# Patient Record
Sex: Female | Born: 1950 | Race: Black or African American | Hispanic: No | Marital: Single | Smoking: Former smoker
Health system: Southern US, Community
[De-identification: ages and names within clinical notes are randomized; demographics above are authoritative.]

## PROBLEM LIST (undated history)

## (undated) DIAGNOSIS — D649 Anemia, unspecified: Secondary | ICD-10-CM

## (undated) DIAGNOSIS — J449 Chronic obstructive pulmonary disease, unspecified: Secondary | ICD-10-CM

## (undated) DIAGNOSIS — E119 Type 2 diabetes mellitus without complications: Secondary | ICD-10-CM

## (undated) DIAGNOSIS — E876 Hypokalemia: Secondary | ICD-10-CM

## (undated) DIAGNOSIS — F319 Bipolar disorder, unspecified: Secondary | ICD-10-CM

## (undated) DIAGNOSIS — J309 Allergic rhinitis, unspecified: Secondary | ICD-10-CM

## (undated) DIAGNOSIS — E785 Hyperlipidemia, unspecified: Secondary | ICD-10-CM

## (undated) DIAGNOSIS — I2510 Atherosclerotic heart disease of native coronary artery without angina pectoris: Secondary | ICD-10-CM

## (undated) DIAGNOSIS — Z8673 Personal history of transient ischemic attack (TIA), and cerebral infarction without residual deficits: Secondary | ICD-10-CM

## (undated) DIAGNOSIS — C50919 Malignant neoplasm of unspecified site of unspecified female breast: Secondary | ICD-10-CM

## (undated) DIAGNOSIS — C801 Malignant (primary) neoplasm, unspecified: Secondary | ICD-10-CM

## (undated) DIAGNOSIS — R002 Palpitations: Secondary | ICD-10-CM

## (undated) DIAGNOSIS — K219 Gastro-esophageal reflux disease without esophagitis: Secondary | ICD-10-CM

## (undated) HISTORY — DX: Allergic rhinitis, unspecified: J30.9

## (undated) HISTORY — DX: Hyperlipidemia, unspecified: E78.5

## (undated) HISTORY — PX: DILATION AND CURETTAGE OF UTERUS: SHX78

## (undated) HISTORY — PX: OTHER SURGICAL HISTORY: SHX169

## (undated) HISTORY — PX: COLONOSCOPY: SHX174

## (undated) HISTORY — DX: Bipolar disorder, unspecified: F31.9

## (undated) HISTORY — DX: Malignant neoplasm of unspecified site of unspecified female breast: C50.919

## (undated) HISTORY — DX: Personal history of transient ischemic attack (TIA), and cerebral infarction without residual deficits: Z86.73

## (undated) HISTORY — PX: STERILIZATION: SHX533

## (undated) HISTORY — DX: Gastro-esophageal reflux disease without esophagitis: K21.9

## (undated) HISTORY — PX: UPPER GASTROINTESTINAL ENDOSCOPY: SHX188

## (undated) HISTORY — DX: Chronic obstructive pulmonary disease, unspecified: J44.9

---

## 2000-01-25 ENCOUNTER — Other Ambulatory Visit: Admission: RE | Admit: 2000-01-25 | Discharge: 2000-01-25 | Payer: Self-pay | Admitting: Oral Surgery

## 2000-12-04 ENCOUNTER — Other Ambulatory Visit: Admission: RE | Admit: 2000-12-04 | Discharge: 2000-12-04 | Payer: Self-pay | Admitting: Specialist

## 2000-12-17 ENCOUNTER — Ambulatory Visit (HOSPITAL_COMMUNITY): Admission: RE | Admit: 2000-12-17 | Discharge: 2000-12-17 | Payer: Self-pay | Admitting: Specialist

## 2000-12-17 ENCOUNTER — Encounter: Payer: Self-pay | Admitting: Specialist

## 2001-01-08 ENCOUNTER — Ambulatory Visit (HOSPITAL_COMMUNITY): Admission: RE | Admit: 2001-01-08 | Discharge: 2001-01-08 | Payer: Self-pay | Admitting: Internal Medicine

## 2001-01-08 ENCOUNTER — Encounter (INDEPENDENT_AMBULATORY_CARE_PROVIDER_SITE_OTHER): Payer: Self-pay | Admitting: Internal Medicine

## 2001-08-16 ENCOUNTER — Emergency Department (HOSPITAL_COMMUNITY): Admission: EM | Admit: 2001-08-16 | Discharge: 2001-08-16 | Payer: Self-pay | Admitting: Emergency Medicine

## 2001-08-16 ENCOUNTER — Encounter: Payer: Self-pay | Admitting: Emergency Medicine

## 2001-08-26 ENCOUNTER — Emergency Department (HOSPITAL_COMMUNITY): Admission: EM | Admit: 2001-08-26 | Discharge: 2001-08-26 | Payer: Self-pay | Admitting: *Deleted

## 2001-09-02 ENCOUNTER — Emergency Department (HOSPITAL_COMMUNITY): Admission: EM | Admit: 2001-09-02 | Discharge: 2001-09-02 | Payer: Self-pay | Admitting: Emergency Medicine

## 2001-09-02 ENCOUNTER — Encounter: Payer: Self-pay | Admitting: Orthopaedic Surgery

## 2001-09-02 ENCOUNTER — Encounter: Payer: Self-pay | Admitting: Emergency Medicine

## 2001-09-02 ENCOUNTER — Ambulatory Visit (HOSPITAL_COMMUNITY): Admission: RE | Admit: 2001-09-02 | Discharge: 2001-09-02 | Payer: Self-pay | Admitting: Orthopaedic Surgery

## 2001-12-19 ENCOUNTER — Ambulatory Visit (HOSPITAL_COMMUNITY): Admission: RE | Admit: 2001-12-19 | Discharge: 2001-12-19 | Payer: Self-pay | Admitting: Specialist

## 2001-12-19 ENCOUNTER — Encounter: Payer: Self-pay | Admitting: Specialist

## 2002-04-14 ENCOUNTER — Encounter (INDEPENDENT_AMBULATORY_CARE_PROVIDER_SITE_OTHER): Payer: Self-pay | Admitting: Internal Medicine

## 2002-04-14 ENCOUNTER — Ambulatory Visit (HOSPITAL_COMMUNITY): Admission: RE | Admit: 2002-04-14 | Discharge: 2002-04-14 | Payer: Self-pay | Admitting: Internal Medicine

## 2002-05-06 ENCOUNTER — Encounter: Payer: Self-pay | Admitting: Urology

## 2002-05-06 ENCOUNTER — Ambulatory Visit (HOSPITAL_COMMUNITY): Admission: RE | Admit: 2002-05-06 | Discharge: 2002-05-06 | Payer: Self-pay | Admitting: Urology

## 2002-05-19 ENCOUNTER — Encounter (INDEPENDENT_AMBULATORY_CARE_PROVIDER_SITE_OTHER): Payer: Self-pay | Admitting: Internal Medicine

## 2002-05-19 ENCOUNTER — Ambulatory Visit (HOSPITAL_COMMUNITY): Admission: RE | Admit: 2002-05-19 | Discharge: 2002-05-19 | Payer: Self-pay | Admitting: Internal Medicine

## 2002-05-26 ENCOUNTER — Encounter: Payer: Self-pay | Admitting: Urology

## 2002-05-26 ENCOUNTER — Ambulatory Visit (HOSPITAL_COMMUNITY): Admission: RE | Admit: 2002-05-26 | Discharge: 2002-05-26 | Payer: Self-pay | Admitting: Urology

## 2002-08-05 ENCOUNTER — Emergency Department (HOSPITAL_COMMUNITY): Admission: EM | Admit: 2002-08-05 | Discharge: 2002-08-05 | Payer: Self-pay | Admitting: *Deleted

## 2002-11-13 ENCOUNTER — Ambulatory Visit (HOSPITAL_COMMUNITY): Admission: RE | Admit: 2002-11-13 | Discharge: 2002-11-13 | Payer: Self-pay | Admitting: Pulmonary Disease

## 2002-12-08 ENCOUNTER — Encounter: Payer: Self-pay | Admitting: Emergency Medicine

## 2002-12-08 ENCOUNTER — Emergency Department (HOSPITAL_COMMUNITY): Admission: EM | Admit: 2002-12-08 | Discharge: 2002-12-08 | Payer: Self-pay | Admitting: Emergency Medicine

## 2002-12-28 ENCOUNTER — Encounter: Payer: Self-pay | Admitting: Specialist

## 2002-12-28 ENCOUNTER — Ambulatory Visit (HOSPITAL_COMMUNITY): Admission: RE | Admit: 2002-12-28 | Discharge: 2002-12-28 | Payer: Self-pay | Admitting: Specialist

## 2003-01-28 ENCOUNTER — Emergency Department (HOSPITAL_COMMUNITY): Admission: EM | Admit: 2003-01-28 | Discharge: 2003-01-29 | Payer: Self-pay | Admitting: *Deleted

## 2003-12-28 ENCOUNTER — Ambulatory Visit (HOSPITAL_COMMUNITY): Admission: RE | Admit: 2003-12-28 | Discharge: 2003-12-28 | Payer: Self-pay | Admitting: Pulmonary Disease

## 2003-12-30 ENCOUNTER — Ambulatory Visit (HOSPITAL_COMMUNITY): Admission: RE | Admit: 2003-12-30 | Discharge: 2003-12-30 | Payer: Self-pay | Admitting: Obstetrics & Gynecology

## 2004-01-07 ENCOUNTER — Ambulatory Visit (HOSPITAL_COMMUNITY): Admission: RE | Admit: 2004-01-07 | Discharge: 2004-01-07 | Payer: Self-pay

## 2004-01-31 ENCOUNTER — Ambulatory Visit (HOSPITAL_COMMUNITY): Admission: RE | Admit: 2004-01-31 | Discharge: 2004-01-31 | Payer: Self-pay | Admitting: Internal Medicine

## 2004-02-14 ENCOUNTER — Ambulatory Visit (HOSPITAL_COMMUNITY): Admission: RE | Admit: 2004-02-14 | Discharge: 2004-02-14 | Payer: Self-pay | Admitting: Pulmonary Disease

## 2004-02-15 ENCOUNTER — Ambulatory Visit (HOSPITAL_COMMUNITY): Admission: RE | Admit: 2004-02-15 | Discharge: 2004-02-15 | Payer: Self-pay

## 2004-05-11 ENCOUNTER — Ambulatory Visit: Payer: Self-pay | Admitting: Infectious Diseases

## 2004-05-17 ENCOUNTER — Emergency Department (HOSPITAL_COMMUNITY): Admission: EM | Admit: 2004-05-17 | Discharge: 2004-05-17 | Payer: Self-pay | Admitting: Emergency Medicine

## 2004-06-05 ENCOUNTER — Ambulatory Visit: Payer: Self-pay | Admitting: Infectious Diseases

## 2004-06-07 ENCOUNTER — Ambulatory Visit: Payer: Self-pay | Admitting: Internal Medicine

## 2004-06-23 ENCOUNTER — Ambulatory Visit: Payer: Self-pay | Admitting: Internal Medicine

## 2004-07-04 ENCOUNTER — Ambulatory Visit: Payer: Self-pay | Admitting: Internal Medicine

## 2004-09-07 ENCOUNTER — Ambulatory Visit: Payer: Self-pay | Admitting: Internal Medicine

## 2005-01-01 ENCOUNTER — Ambulatory Visit (HOSPITAL_COMMUNITY): Admission: RE | Admit: 2005-01-01 | Discharge: 2005-01-01 | Payer: Self-pay | Admitting: Pulmonary Disease

## 2005-01-02 ENCOUNTER — Ambulatory Visit: Payer: Self-pay | Admitting: Internal Medicine

## 2005-01-15 ENCOUNTER — Encounter: Payer: Self-pay | Admitting: Emergency Medicine

## 2005-01-16 ENCOUNTER — Inpatient Hospital Stay (HOSPITAL_COMMUNITY): Admission: RE | Admit: 2005-01-16 | Discharge: 2005-01-22 | Payer: Self-pay | Admitting: Psychiatry

## 2005-01-16 ENCOUNTER — Ambulatory Visit: Payer: Self-pay | Admitting: Psychiatry

## 2005-02-16 ENCOUNTER — Emergency Department (HOSPITAL_COMMUNITY): Admission: EM | Admit: 2005-02-16 | Discharge: 2005-02-17 | Payer: Self-pay | Admitting: Emergency Medicine

## 2005-05-30 ENCOUNTER — Ambulatory Visit: Payer: Self-pay | Admitting: Internal Medicine

## 2005-10-08 ENCOUNTER — Ambulatory Visit: Payer: Self-pay | Admitting: Internal Medicine

## 2005-11-01 ENCOUNTER — Ambulatory Visit: Payer: Self-pay | Admitting: Internal Medicine

## 2005-12-04 ENCOUNTER — Ambulatory Visit (HOSPITAL_COMMUNITY): Admission: RE | Admit: 2005-12-04 | Discharge: 2005-12-04 | Payer: Self-pay | Admitting: Pulmonary Disease

## 2006-04-10 ENCOUNTER — Ambulatory Visit: Payer: Self-pay | Admitting: Internal Medicine

## 2006-04-10 ENCOUNTER — Inpatient Hospital Stay (HOSPITAL_COMMUNITY): Admission: AD | Admit: 2006-04-10 | Discharge: 2006-04-15 | Payer: Self-pay | Admitting: Pulmonary Disease

## 2009-08-10 ENCOUNTER — Inpatient Hospital Stay (HOSPITAL_COMMUNITY)
Admission: EM | Admit: 2009-08-10 | Discharge: 2009-08-14 | Payer: Self-pay | Source: Home / Self Care | Admitting: Emergency Medicine

## 2009-08-10 ENCOUNTER — Ambulatory Visit: Payer: Self-pay | Admitting: Cardiology

## 2009-08-11 ENCOUNTER — Encounter (INDEPENDENT_AMBULATORY_CARE_PROVIDER_SITE_OTHER): Payer: Self-pay | Admitting: Pulmonary Disease

## 2010-02-13 ENCOUNTER — Encounter (INDEPENDENT_AMBULATORY_CARE_PROVIDER_SITE_OTHER): Payer: Self-pay

## 2010-05-07 ENCOUNTER — Encounter: Payer: Self-pay | Admitting: Pulmonary Disease

## 2010-05-09 ENCOUNTER — Ambulatory Visit (HOSPITAL_COMMUNITY)
Admission: RE | Admit: 2010-05-09 | Discharge: 2010-05-09 | Payer: Self-pay | Source: Home / Self Care | Attending: Pulmonary Disease | Admitting: Pulmonary Disease

## 2010-05-16 NOTE — Letter (Signed)
Summary: Recall, Screening Colonoscopy Only  [facility]  [address]   [place], [place] [address]   Phone: [phone]  Fax: [phone]    February 13, 2010  [name] [address] [place], [place]  [address] September 28, 1950   Dear [name],   Our records indicate it is time to schedule your colonoscopy.     Please call our office at [phone] and ask for the nurse.   Thank you,  Kristine Limes, LPN Cloria Spring, LPN  [facility] Ph: [phone]   Fax: [phone]

## 2010-07-04 LAB — RPR: RPR Ser Ql: NONREACTIVE

## 2010-07-04 LAB — HEPATIC FUNCTION PANEL
ALT: 31 U/L (ref 0–35)
ALT: 34 U/L (ref 0–35)
AST: 27 U/L (ref 0–37)
AST: 31 U/L (ref 0–37)
Albumin: 4.4 g/dL (ref 3.5–5.2)
Alkaline Phosphatase: 89 U/L (ref 39–117)
Alkaline Phosphatase: 98 U/L (ref 39–117)
Bilirubin, Direct: 0.1 mg/dL (ref 0.0–0.3)
Indirect Bilirubin: 0.3 mg/dL (ref 0.3–0.9)
Total Bilirubin: 0.4 mg/dL (ref 0.3–1.2)
Total Protein: 7.6 g/dL (ref 6.0–8.3)

## 2010-07-04 LAB — BASIC METABOLIC PANEL
CO2: 29 mEq/L (ref 19–32)
Calcium: 9.8 mg/dL (ref 8.4–10.5)
Chloride: 105 mEq/L (ref 96–112)
GFR calc Af Amer: >60 mL/min (ref >60)
Sodium: 141 mEq/L (ref 135–145)

## 2010-07-04 LAB — DIFFERENTIAL
Basophils Relative: 0 % (ref 0–1)
Lymphs Abs: 2.6 10*3/uL (ref 0.7–4.0)
Monocytes Absolute: 0.7 10*3/uL (ref 0.1–1.0)
Monocytes Relative: 7 % (ref 3–12)
Neutro Abs: 6.3 10*3/uL (ref 1.7–7.7)
Neutrophils Relative %: 65 % (ref 43–77)

## 2010-07-04 LAB — CBC
Hemoglobin: 13.6 g/dL (ref 12.0–15.0)
MCHC: 34.6 g/dL (ref 30.0–36.0)
MCV: 96.1 fL (ref 78.0–100.0)
RBC: 4.1 MIL/uL (ref 3.87–5.11)
WBC: 9.6 10*3/uL (ref 4.0–10.5)

## 2010-07-04 LAB — GLUCOSE, CAPILLARY: Glucose-Capillary: 98 mg/dL (ref 70–99)

## 2010-07-04 LAB — PROTIME-INR: INR: 1.02 (ref 0.00–1.49)

## 2010-09-01 NOTE — Discharge Summary (Signed)
[name], [name]             ACCOUNT NO.:  [id number]   MEDICAL RECORD NO.:  [id number]          PATIENT TYPE:  INP   LOCATION:  A201                          FACILITY:  [facility]   PHYSICIAN:  Edward L. Juanetta Gosling, M.D.DATE OF BIRTH:  01-Mar-1951   DATE OF ADMISSION:  04/10/2006  DATE OF DISCHARGE:  LH                               DISCHARGE SUMMARY   FINAL DISCHARGE DIAGNOSES:  1. Hypokalemia.  2. Weight loss, probably from inadequate intake.  3. Gastroesophageal reflux disease.  4. Schizophrenia.  5. Irritable bowel syndrome.   HISTORY:  [name] is a 60 year old who has had weight loss and who  has been in my office for evaluation of this.  She has had CTs done.  She has had lab work.  None of these have shown anything that would  cause her weight loss.  She continued having burning abdominal and chest  pain, and because of that, she had consultation with [facility].  There, she had lab work done, and her potassium was  1.8, and she was brought in because of a very low potassium level.   Her physical exam shows a thin female in no acute distress.  Blood  pressure 110/78, pulse 80, respirations 18.  Mucous membranes were dry.  Her abdomen was soft, mildly diffusely tender.  CNS was grossly intact.   HOSPITAL COURSE:  She had vigorous replacement of her potassium and  eventually came up to 3.8.  A GI consultation was obtained, but since  she started eating and was eating much better and doing better, it was  felt that she did not require any further workup.  By the time of  discharge, she was much improved, and she was discharged home on:  1. Vytorin 10/40 daily.  2. Protonix 40 mg daily.  3. Nortriptyline 50 mg at bedtime.  4. Loxapine 10 mg at bedtime.  5. She takes an injectable antipsychotic at home.  This is per the      mental health center, and she will continue that.  6. She is also going to be on potassium chloride, a new medication, 20  mEq 3 times a day.   She will return to my office about 2 weeks.      Edward L. Juanetta Gosling, M.D.  Electronically Signed     ELH/MEDQ  D:  04/15/2006  T:  04/15/2006  Job:  [id number]

## 2010-09-01 NOTE — Consult Note (Signed)
[name], [name]             ACCOUNT NO.:  [id number]   MEDICAL RECORD NO.:  [id number]          PATIENT TYPE:  INP   LOCATION:  A201                          FACILITY:  [facility]   PHYSICIAN:  Lionel December, M.D.    DATE OF BIRTH:  [birth year]   DATE OF CONSULTATION:  04/10/2006  DATE OF DISCHARGE:                                 CONSULTATION   CHIEF COMPLAINT:  Diarrhea and weight loss.   HISTORY OF PRESENT ILLNESS:  [name] is a 60 year old African-  American female with history of chronic diarrhea, weight loss, and  schizophrenia.  She presented to the office today with 3 of her sisters  who are quite concerned.  They say that she has been complaining of  diarrhea with up to 5 loose stools per day.  This has been a chronic  problem for her.  She cannot tell me exactly when this bout started.  She has also had nausea and vomiting on a daily basis as well.  She  complains of mid abdominal pain.  She has history of schizophrenia.  She  has had multiple psychiatric admissions.  She is followed by Dr. Thomasena Edis  at mental health.  She lives alone in an apartment.  She tells me she is  only eating 1 small meal a day.  Her family is concerned as she never  leaves the apartment and does not answer the phone when they call.  She  also declined going on outings with them.  She has had a full set of  stool studies back in October which were negative.  She has lost 33-1/2  pounds in the last year.   PAST MEDICAL HISTORY:  1. She has history of chronic diarrhea with extensive workup      previously.  Last colonoscopy was in 2001.  She was found to have a      small hyperplastic polyp removed from her sigmoid colon.  2. She had a history of IBS.  3. Schizophrenia.  4. Nervous breakdown.  5. Ganglion cyst from left wrist.  6. Decompression of right carpal tunnel.  7. Benign lump removed from her right breast.  8. She has an EGD for chronic GERD January 31, 2004.  She was found to  have a small, sliding hiatal hernia, otherwise normal EGD.  She was      empirically dilated.   CURRENT MEDICATIONS:  1. Nortriptyline 50 mg daily.  2. Loxapine 10 mg q.h.s.  3. Protonix 40 mg daily.  4. Vytorin once daily.  5. Injection at mental health every 2 weeks.  The patient is unsure of      exact medication.   ALLERGIES:  IBUPROFEN, HALDOL and HYDROCODONE.   FAMILY HISTORY:  There is no known family history of inflammatory bowel  disease or colorectal carcinoma.  History is positive for breast  carcinoma, diabetes mellitus, coronary artery disease, hypertension.   SOCIAL HISTORY:  She is disabled.  She resides alone in apartment.  She  has tobacco use history.  She denies any alcohol or drug use.  She is  single.  Does not  have any children.   REVIEW OF SYSTEMS:  CONSTITUTIONAL:  Denies any fever or chills, is  complaining of fatigue and lethargy.  CARDIOVASCULAR:  Denies any chest  pain or palpitations.  PULMONARY:  Denies any cough, dyspnea, or  hemoptysis.  GI:  See HPI.  Denies any problems with heartburn or  indigestion.  She is complaining of anorexia and early satiety.  GU:  Denies any dysuria, hematuria, increased urinary frequency.   PHYSICAL EXAM:  VITAL SIGNS:  Weight 110 pounds, height 66 inches, temp  98.2, blood pressure 122/60, pulse 100 apically.  HEENT:  Sclerae clear, nonicteric.  Conjunctivae pink.  Oropharynx pink  and moist without any lesions.  NECK:  Supple without thyromegaly.  CHEST:  Heart regular rate and rhythm with a normal S1-S2 without any  murmurs, clicks, rubs, or gallops.  LUNGS:  Clear to auscultation bilaterally.  ABDOMEN:  Positive bowel sounds x4.  No bruits auscultated.  Soft,  nontender, nondistended without palpable mass or hepatosplenomegaly.  No  rebound tenderness or guarding.  EXTREMITIES:  Without clubbing or edema bilaterally.  SKIN:  Poor turgor.  Skin is warm and dry.   IMPRESSION:  [name] is a 60 year old  African-American female with  history of chronic diarrhea.  More recently, she has had worsening  diarrhea, nausea and vomiting on a daily basis.  She appears somewhat  dehydrated.  Today, she was sent for STAT laboratory studies which  showed hypokalemia with a potassium of 1.5.  This case was discussed  with Dr. Karilyn Cota who spoke with Dr. Juanetta Gosling.  She will be admitted to  [facility] for further evaluation of her weight loss, nausea,  vomiting, diarrhea, and treatment of her hypokalemia.   PLAN:  The patient is to be admitted to telemetry.  We will give  potassium supplementation and IV fluids as well as continue her current  medications and recheck potassium and magnesium in the morning.  We  would like to thank Dr. Juanetta Gosling for assistance with [name].      Nicholas Lose, N.P.      Lionel December, M.D.  Electronically Signed    KC/MEDQ  D:  04/10/2006  T:  04/10/2006  Job:  [id number]   cc:   Ramon Dredge L. Juanetta Gosling, M.D.  Fax: [phone]   Lionel December, M.D.  [address]  [place]  [place] [address]

## 2010-09-01 NOTE — Group Therapy Note (Signed)
NAMEJERUSALEM, BROWNSTEIN             ACCOUNT NO.:  [id number]   MEDICAL RECORD NO.:  [id number]          PATIENT TYPE:  INP   LOCATION:  A201                          FACILITY:  [facility]   PHYSICIAN:  Mila Homer. Sudie Bailey, M.D.DATE OF BIRTH:  December 31, 1950   DATE OF PROCEDURE:  DATE OF DISCHARGE:                                 PROGRESS NOTE   SUBJECTIVE:  The patient generally feeling better.  She is hungry and  eating.   OBJECTIVE:  She is standing up in the room with her sister in  attendance.  She is in no acute distress.  She is well-developed, well-  nourished.  Appears to be alert.  Temperature is 97 degrees, pulse 95, respirations 16, blood pressure  158/91.  HEART:  Regular rhythm, rate of 90.  LUNGS:  Clear throughout.  Moving air well.  ABDOMEN:  Soft.  She has normal bowel sounds.  There is no edema to the  ankles.   Her HIV is negative.  Today, the O2 saturation on 2 L is 96%.   ASSESSMENT:  1. Abnormal weight loss, probably just due to not eating enough.  2. Severe hypokalemia.  3. Schizophrenia.  4. Onychomycosis of the toenails.  5. Tobacco use disorder.   PLAN:  Discussed with Dr. Roetta Sessions, gastroenterologist.  At this  point, EGD will not be performed since she is doing well and eating  well, and she has heme-negative stool.  We will be checking a potassium  tomorrow.  Meanwhile, we will encourage her to eat.      Mila Homer. Sudie Bailey, M.D.  Electronically Signed     SDK/MEDQ  D:  04/14/2006  T:  04/14/2006  Job:  [id number]

## 2010-09-01 NOTE — Group Therapy Note (Signed)
[name], [name]             ACCOUNT NO.:  [id number]   MEDICAL RECORD NO.:  [id number]          PATIENT TYPE:  INP   LOCATION:  A201                          FACILITY:  [facility]   PHYSICIAN:  Edward L. Juanetta Gosling, M.D.DATE OF BIRTH:  October 16, 1950   DATE OF PROCEDURE:  04/11/2006  DATE OF DISCHARGE:                                 PROGRESS NOTE   PROBLEM:  Hypokalemia, weight loss, abdominal discomfort and  schizophrenia.   SUBJECTIVE:  Ms. [name] is overall about the same this morning.  She has  no new complaints.  She says she is hungry.   PHYSICAL EXAMINATION:  VITAL SIGNS:  Temperature is 99.1, pulse 103,  respirations 18, blood pressure 122/68.  Her O2 saturations 88% on room  air, 92% earlier.  Her potassium is to some going to have her go ahead  and get more runs of potassium.  Her CO2 is greater than 45 on a B-met.  I am going to go ahead and get a blood gas to make sure that we are not  missing chronically elevated pCO2.  CHEST:  Her chest is clear.  EXTREMITIES:  Her feet showed marked scaling rash of her feet, and I am  going to plan to go ahead and have her take Lac-Hydrin for that.  I have  asked for case management consultation.  Of course, GI is going to see  her, no acute distress we will continue everything else.      Edward L. Juanetta Gosling, M.D.  Electronically Signed     ELH/MEDQ  D:  04/11/2006  T:  04/11/2006  Job:  [id number]

## 2010-09-01 NOTE — Group Therapy Note (Signed)
[name], [name]             ACCOUNT NO.:  [id number]   MEDICAL RECORD NO.:  [id number]          PATIENT TYPE:  INP   LOCATION:  A201                          FACILITY:  [facility]   PHYSICIAN:  Edward L. Juanetta Gosling, M.D.DATE OF BIRTH:  [birth year]   DATE OF PROCEDURE:  04/15/2006  DATE OF DISCHARGE:  04/15/2006                                 PROGRESS NOTE   PROBLEMS:  1. Failure to thrive.  2. Hypokalemia.  3. Schizophrenia.   HISTORY:  The patient says she feels well and wants to go home.  She is  eating much better now, and I think it is reasonable for her to go.   Her temperature is 97.9, pulse is 88, respirations 20, blood pressure  137/84.  Her O2 saturation is  96% on room air.   Her lab work this morning shows her white count 8800, her hemoglobin is  11.9, platelets 254.  HIV screen is negative.  Her potassium is up to  3.8, glucose is 103.   ASSESSMENT:  She is much improved.   PLAN:  Is to discharge home with maximum home health support.      Edward L. Juanetta Gosling, M.D.  Electronically Signed     ELH/MEDQ  D:  04/15/2006  T:  04/15/2006  Job:  [id number]

## 2010-09-01 NOTE — H&P (Signed)
NAMESAMEKA, [name]             ACCOUNT NO.:  [id number]   MEDICAL RECORD NO.:  [id number]           PATIENT TYPE:   LOCATION:                                 FACILITY:   PHYSICIAN:  Lionel December, M.D.    DATE OF BIRTH:  1951-01-09   DATE OF ADMISSION:  01/20/2004  DATE OF DISCHARGE:  LH                                HISTORY & PHYSICAL   PRESENTING COMPLAINT:  Solid food dysphagia.   HISTORY OF PRESENT ILLNESS:  [name] is a 60 year old African American  female, a patient of Dr. Juanetta Gosling, who is here for scheduled visit.  She was  last seen on November 22, 2003, and she was complaining of burning chest pain.  She was felt to have GERD.  She was begun on Protonix and antireflux  measures that are reviewed with the patient.  She states her chest  pain/burning has completely resolved.  However, she now complains of  dysphagia.  She has had dysphagia off and on for the last year and a half to  two.  She had barium study in February of 2004.  This was within normal  limits, although she had significant GE reflux.  She has difficulty with  solids, particularly steak and beef.  She feels as if food is getting stuck  in her upper chest suprasternal area.  She denies chronic hoarseness, cough  or sore throat.  She remains concerned about her protuberant abdomen.  She  has had ultrasound in the past which was negative for ascites or other  problems.  She has a good appetite.  She has gained 5 pounds in the last two  months.  Her bowels move daily.  She denies melena or rectal bleeding.   MEDICATIONS:  1.  Nortriptyline 25 mg daily.  2.  Loxapine 10 mg q.h.s.  3.  Monopril 10 mg daily.  4.  Protonix 40 mg q.a.m.   PAST MEDICAL HISTORY:  1.  History of schizophrenia.  2.  She had a nervous breakdown in 1985 and had to be hospitalized.  3.  She had a ganglion cyst excised from her left wrist.  4.  She had decompression of her right carpal tunnel.  5.  She had a benign lump removed from  her right breast.  6.  History of IBS.  She has had diarrhea in the past.  Her stool studies      have been negative.  She had colonoscopy in October of 2001 when she      also had heme-positive stool.  She had a small hyperplastic polyp      removed from her sigmoid colon.   ALLERGIES:  Reportedly to IBUPROFEN.  She says it makes her appetite  tremendous.   SOCIAL HISTORY:  She is disabled.  She worked at [employer] for a few  years.  She has been smoking for 30 years, presently smoking half of a pack  per day.  She does not drink alcohol.   FAMILY HISTORY:  She is single and she does not have any children.  She has  been smoking for  30 years, now half of a pack a day.  She does not drink  alcohol.   FAMILY HISTORY:  One sister has been treated for breast carcinoma and  remains in remission.  Another sister has DM.  A third sister had CAD and  hypertension.  A brother also has CAD.   PHYSICAL EXAMINATION:  GENERAL APPEARANCE:  A pleasant, well-developed, well-  nourished, African American female who is in no acute distress.  WEIGHT:  She weighs 144-1/2 pounds.  HEIGHT:  She is 5 feet 6 inches tall.  VITAL SIGNS:  Pulse 92 per minute, blood pressure 126/70, temperature 97.6  degrees.  HEENT:  The conjunctivae are pink.  The sclerae are nonicteric.  The  oropharynx mucosa is normal.  She has well-fitting upper and lower dentures  in place.  NECK:  No neck masses are noted.  CARDIAC:  Regular rhythm.  Normal S1 and S2.  No murmur or gallop noted.  LUNGS:  Clear to auscultation.  ABDOMEN:  Protuberant, but very soft and nontender with no organomegaly or  masses.  EXTREMITIES:  No peripheral edema or clubbing noted.   ASSESSMENT:  [name]'s burning chest pain has improved 100% with antireflux  therapy.  Now she is complaining of solid food dysphagia.  She has had this  intermittently in the past.  Barium study in February of 2004 was negative  for stricture or ring.  At that  time, she wanted to wait and see.  She now  is interested in having this further evaluated.  Therefore, EGD and possible  ED would be reasonable.   Therefore, EGD and ED in this setting would be appropriate.   RECOMMENDATIONS:  1.  She will continue antireflux measures and Protonix as before.      Prescription given for 40 mg q.a.m., 30 with 11 refills.  She was also      given samples.  2.  Esophagogastroduodenoscopy and possible esophageal dilation to be      performed at [facility] in near future.  I have reviewed the procedure and      risks with the patient and she is agreeable.      NR/MEDQ  D:  01/20/2004  T:  01/20/2004  Job:  [id number]   cc:   Ramon Dredge L. Juanetta Gosling, M.D.  [address]  [place]  [place] [address]  Fax: [phone]   [facility]

## 2010-09-01 NOTE — H&P (Signed)
[name], [name]             ACCOUNT NO.:  [id number]   MEDICAL RECORD NO.:  [id number]          PATIENT TYPE:  INP   LOCATION:  A201                          FACILITY:  [facility]   PHYSICIAN:  Edward L. Juanetta Gosling, M.D.DATE OF BIRTH:  05-15-1950   DATE OF ADMISSION:  04/10/2006  DATE OF DISCHARGE:  LH                              HISTORY & PHYSICAL   [name] was admitted for hypokalemia.  She has had significant  weight loss in the last several months.  She is 60 years old and had  been in my office and had evaluation done with multiple lab work and had  CTs done to evaluate for weight loss and none of these showed anything.  She continued, however, having burning abdominal chest pain, and because  of her abdominal pain and weight loss she had consultation with the  [facility], then when she had lab work done in their  office her potassium was 1.8.  She was therefore admitted both to  replace potassium and to go ahead and have a workup.   PAST MEDICAL HISTORY:  1. Positive for multiple bouts of reflux.  2. She has a history of schizophrenia.  3. She was hospitalized in 1985 with mental illness.  4. She has had a ganglion cyst removed from her left wrist.  5. She has had a right carpal tunnel decompression.  6. She had a benign lump removed from her right breast.  7. She has had irritable bowel syndrome.   MEDICATIONS:  She is on medications for her schizophrenia, but it is not  clear to me what they are.   SOCIAL HISTORY:  She is disabled.  She lives alone.  She has multiple  family members who have been trying to help with her care, but she has  basically been refusing any of their help.  They are requesting to try  to get further help for her at home to see if there is anything that can  be done to help with her ADLs and to help get her eating.  They are  concerned that she is simply not eating at this point.  She has about a 30 pack-year smoking history.   She does not drink any  alcohol.   FAMILY HISTORY:  She has had a sister who had breast cancer, another  with diabetes.  There is history of coronary artery occlusive disease.   PHYSICAL EXAMINATION:  Shows a thin African American female who is in no  acute distress now.  Her blood pressure 110/78, pulse is 80 and regular.  Her respirations  are 18.  Her pupils are equal, round, react to light and accommodation.  Her nose  and throat are clear.  Her neck is supple.  Her mucous membranes are  somewhat dry.  CHEST:  Fairly clear with rhonchi bilaterally.  HEART:  Regular without gallop.  ABDOMEN:  Soft, mildly diffusely tender.  EXTREMITIES:  Showed no edema.  CENTRAL NERVOUS SYSTEM:  Examination is grossly intact.   ASSESSMENT:  1. She has weight loss.  2. She has schizophrenia.  3. She is  hypokalemic.   PLAN:  Her potassium to be replaced and then rechecked tomorrow.  GI  consultation, of course, and we will ask for case management to see her  as well.      Edward L. Juanetta Gosling, M.D.  Electronically Signed     ELH/MEDQ  D:  04/10/2006  T:  04/10/2006  Job:  [id number]

## 2010-09-01 NOTE — Group Therapy Note (Signed)
NAMEJOLONDA, [name]             ACCOUNT NO.:  [id number]   MEDICAL RECORD NO.:  [id number]          PATIENT TYPE:  INP   LOCATION:  A201                          FACILITY:  [facility]   PHYSICIAN:  Mila Homer. Sudie Bailey, M.D.DATE OF BIRTH:  02-23-1951   DATE OF PROCEDURE:  DATE OF DISCHARGE:                                 PROGRESS NOTE   SUBJECTIVE:  She says she feels better today.  Family takes me aside and  tells me she has been living at [facility], has been followed by [employer] and a Retail buyer, but she may not even allow them to come in,  she has had a 40-pound weight loss.  Review of the records show that she  has had workup for this with a CT scan, and due to have  esophagogastroduodenoscopy through Gastro[facility] as soon as her  potassium was normal.   OBJECTIVE:  Temperature 99.4.  Pulse 108.  Respirations 16.  Blood  pressure 129/83.  Her 02 saturation was 90% today.  She is sitting up in the bed eating lunch.  She is in no acute distress.  She is well developed and thin.  She appears to be alert.  LUNGS:  Clear.  Moving air well.  HEART:  Regular rhythm, rate of 100.  ABDOMEN:  Soft without tenderness and without organomegaly or mass.  There is no edema of the ankles.  She does have trace edema of the feet,  and she has thickening and yellowing of the nail plates with extensive  detritus under some of the nail plates.   Today, the MET-7 shows a potassium of 3.1.  BUN only 1.  Creatinine  0.65.   ASSESSMENT:  1. Abnormal weight loss.  2. Severe hypokalemia.  3. Schizophrenia.  4. Onychomycosis of the toenails.  5. A 30-pack year history of cigarette smoking.   PLAN:  Continue KCl 20 mEq q.i.d., pantoprazole 40 mg daily, magnesium  oxide 4 mg daily, Loxitane 10 mg nightly, nortriptyline 50 mg nightly.  She is now on Lac-Hydrin b.i.d. for the feet.  She will have  esophagogastroduodenoscopy in the near future.      Mila Homer. Sudie Bailey, M.D.  Electronically Signed     SDK/MEDQ  D:  04/13/2006  T:  04/13/2006  Job:  [id number]

## 2010-09-01 NOTE — Group Therapy Note (Signed)
[name], [name]             ACCOUNT NO.:  [id number]   MEDICAL RECORD NO.:  [id number]          PATIENT TYPE:  INP   LOCATION:  A201                          FACILITY:  [facility]   PHYSICIAN:  Edward L. Juanetta Gosling, M.D.DATE OF BIRTH:  [birth year]   DATE OF PROCEDURE:  DATE OF DISCHARGE:                                 PROGRESS NOTE   PROBLEMS:  1. Hypokalemia.  2. Weight loss.   SUBJECTIVE:  Patient is overall about the same.  We are replacing her  potassium and her magnesium; and she says she feels a little stronger,  but she is still not back to normal yet.   OBJECTIVE:  Her physical exam shows that her temperature is 98.3, pulse  103, respirations 20, blood pressure 138/77, O2 saturation is 100% on  room air.  Her chest is clearer.  Her heart is regular.  Her abdomen is  soft.  Extremities showed no edema.  Her potassium is 2.1, now;  magnesium was 1.5, so that is being replaced.   ASSESSMENT:  She has hypokalemia and hypomagnesemia.  She has had  significant weight loss.   PLAN:  Then is to go ahead and continue with her magnesium replacement;  continue her potassium replacement; of note is the fact that her  magnesium was actually 1.5 on the borderline, but will see if it helps  to get her potassium level up by replacing the magnesium.  I have  discussed her situation with the family; and they had originally said  that they wanted her placed; now family are saying that they do not want  to be placed; that instead they want her to have maximum services at  home.  She does have many the family members in the area; and I will  defer to their judgment, if they can get her the needed help at home; I  think, that is okay.  Will repeat labs in the morning.      Edward L. Juanetta Gosling, M.D.  Electronically Signed     ELH/MEDQ  D:  04/12/2006  T:  04/12/2006  Job:  [id number]
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
## 2010-09-01 NOTE — Op Note (Signed)
NAMERAYCHELL, HOLCOMB             ACCOUNT NO.:  [id number]   MEDICAL RECORD NO.:  [id number]          PATIENT TYPE:  AMB   LOCATION:  DAY                           FACILITY:  [facility]   PHYSICIAN:  Lionel December, M.D.    DATE OF BIRTH:  09-19-1950   DATE OF PROCEDURE:  01/31/2004  DATE OF DISCHARGE:                                 OPERATIVE REPORT   PROCEDURE:  Esophagogastroduodenoscopy with esophageal dilation.   INDICATIONS:  [name] is a 60 year old African-American female with a history  of GERD, who presents with solid food dysphagia.  Her symptoms are currently  well-controlled with PPI.  The procedure risks were reviewed with the  patient, and informed consent was obtained.   PREMEDICATION:  Cetacaine spray for pharyngeal topical anesthesia, Demerol  50 mg IV, Versed 5 mg IV in divided dose.   FINDINGS:  Procedure performed in endoscopy suite.  The patient's vital  signs and O2 saturation were monitored during procedure and remained stable.  The patient was placed in the left lateral recumbent position and the  Olympus video scope was passed via oropharynx without any difficulty into  esophagus.   Esophagus:  Mucosa of the esophagus is normal throughout.  Squamocolumnar  junction was located at 38 cm from the incisors, and there was no ring or  stricture.  Hiatus was at 40 cm.  She had at least a 2 cm size sliding  hiatal hernia.   Stomach:  It was empty and distended very well with insufflation.  Folds of  proximal stomach are normal.  Examination of the mucosa of the body, antrum,  pyloric channel, as well as angularis, fundus, and cardia, was normal.   Duodenum:  Examination of the bulb reveals normal mucosa.  The scope was  passed to the second part of the duodenum, where mucosa and folds are  normal.  The endoscope was withdrawn.   The esophagus was dilated by passing a 56 [place] Maloney dilator to full  insertion.  As the dilator was withdrawn, endoscope was passed  again and  esophagus re-examined, and there was no mucosal disruption.  The endoscope  was withdrawn.  The patient tolerated the procedure well.   FINAL DIAGNOSES:  1.  Small sliding hiatal hernia, otherwise normal      esophagogastroduodenoscopy.  2.  Esophagus dilated by passing 56 [place] Maloney dilator, given history of      dysphagia.   RECOMMENDATIONS:  1.  She will continue antireflux measures and Protonix as before.  2.  She will call the office with a progress report next week.  If she      remains with dysphagia, she will need further evaluation.     Naje   NR/MEDQ  D:  01/31/2004  T:  01/31/2004  Job:  [id number]   cc:   Ramon Dredge L. Juanetta Gosling, M.D.  [address]  [place]  [place] [address]  Fax: [phone]

## 2011-02-05 ENCOUNTER — Other Ambulatory Visit (HOSPITAL_COMMUNITY): Payer: Self-pay | Admitting: Pulmonary Disease

## 2011-02-05 DIAGNOSIS — Z139 Encounter for screening, unspecified: Secondary | ICD-10-CM

## 2011-02-12 ENCOUNTER — Ambulatory Visit (HOSPITAL_COMMUNITY)
Admission: RE | Admit: 2011-02-12 | Discharge: 2011-02-12 | Disposition: A | Discharge status: Home / Self Care | Payer: PRIVATE HEALTH INSURANCE | Source: Ambulatory Visit | Attending: Pulmonary Disease | Admitting: Pulmonary Disease

## 2011-02-12 DIAGNOSIS — Z139 Encounter for screening, unspecified: Secondary | ICD-10-CM

## 2011-02-12 DIAGNOSIS — Z1231 Encounter for screening mammogram for malignant neoplasm of breast: Secondary | ICD-10-CM | POA: Insufficient documentation

## 2011-02-22 ENCOUNTER — Encounter (INDEPENDENT_AMBULATORY_CARE_PROVIDER_SITE_OTHER): Payer: Self-pay | Admitting: *Deleted

## 2011-04-17 HISTORY — PX: BREAST BIOPSY: SHX20

## 2011-04-19 ENCOUNTER — Encounter (INDEPENDENT_AMBULATORY_CARE_PROVIDER_SITE_OTHER): Payer: Self-pay | Admitting: *Deleted

## 2011-08-06 ENCOUNTER — Other Ambulatory Visit (HOSPITAL_COMMUNITY): Payer: Self-pay | Admitting: Pulmonary Disease

## 2011-08-06 DIAGNOSIS — Z139 Encounter for screening, unspecified: Secondary | ICD-10-CM

## 2011-08-14 ENCOUNTER — Ambulatory Visit (HOSPITAL_COMMUNITY)
Admission: RE | Admit: 2011-08-14 | Discharge: 2011-08-14 | Disposition: A | Discharge status: Home / Self Care | Payer: Medicare Other | Source: Ambulatory Visit | Attending: Pulmonary Disease | Admitting: Pulmonary Disease

## 2011-08-14 DIAGNOSIS — M899 Disorder of bone, unspecified: Secondary | ICD-10-CM | POA: Insufficient documentation

## 2011-08-14 DIAGNOSIS — Z13820 Encounter for screening for osteoporosis: Secondary | ICD-10-CM | POA: Insufficient documentation

## 2011-08-14 DIAGNOSIS — Z780 Asymptomatic menopausal state: Secondary | ICD-10-CM | POA: Insufficient documentation

## 2011-08-14 DIAGNOSIS — Z139 Encounter for screening, unspecified: Secondary | ICD-10-CM

## 2011-08-14 DIAGNOSIS — F17200 Nicotine dependence, unspecified, uncomplicated: Secondary | ICD-10-CM | POA: Insufficient documentation

## 2011-11-19 ENCOUNTER — Other Ambulatory Visit (HOSPITAL_COMMUNITY): Payer: Self-pay | Admitting: Pulmonary Disease

## 2011-11-19 DIAGNOSIS — R140 Abdominal distension (gaseous): Secondary | ICD-10-CM

## 2011-11-21 ENCOUNTER — Ambulatory Visit (HOSPITAL_COMMUNITY)
Admission: RE | Admit: 2011-11-21 | Discharge: 2011-11-21 | Disposition: A | Discharge status: Home / Self Care | Payer: Medicare Other | Source: Ambulatory Visit | Attending: Pulmonary Disease | Admitting: Pulmonary Disease

## 2011-11-21 ENCOUNTER — Other Ambulatory Visit (HOSPITAL_COMMUNITY): Payer: Medicare Other

## 2011-11-21 DIAGNOSIS — R1031 Right lower quadrant pain: Secondary | ICD-10-CM | POA: Insufficient documentation

## 2011-11-21 DIAGNOSIS — R140 Abdominal distension (gaseous): Secondary | ICD-10-CM

## 2011-11-21 DIAGNOSIS — R142 Eructation: Secondary | ICD-10-CM | POA: Insufficient documentation

## 2011-11-21 DIAGNOSIS — R141 Gas pain: Secondary | ICD-10-CM | POA: Insufficient documentation

## 2011-12-06 ENCOUNTER — Encounter (INDEPENDENT_AMBULATORY_CARE_PROVIDER_SITE_OTHER): Payer: Self-pay | Admitting: *Deleted

## 2012-02-05 ENCOUNTER — Ambulatory Visit (INDEPENDENT_AMBULATORY_CARE_PROVIDER_SITE_OTHER): Payer: Medicare Other | Admitting: Internal Medicine

## 2012-02-05 ENCOUNTER — Encounter (INDEPENDENT_AMBULATORY_CARE_PROVIDER_SITE_OTHER): Payer: Self-pay | Admitting: Internal Medicine

## 2012-02-05 VITALS — BP 122/90 | HR 80 | Temp 98.2°F | Resp 18 | Ht 65.0 in | Wt 145.2 lb

## 2012-02-05 DIAGNOSIS — K7689 Other specified diseases of liver: Secondary | ICD-10-CM

## 2012-02-05 DIAGNOSIS — F209 Schizophrenia, unspecified: Secondary | ICD-10-CM | POA: Insufficient documentation

## 2012-02-05 DIAGNOSIS — K760 Fatty (change of) liver, not elsewhere classified: Secondary | ICD-10-CM | POA: Insufficient documentation

## 2012-02-05 DIAGNOSIS — E785 Hyperlipidemia, unspecified: Secondary | ICD-10-CM | POA: Insufficient documentation

## 2012-02-05 DIAGNOSIS — K219 Gastro-esophageal reflux disease without esophagitis: Secondary | ICD-10-CM | POA: Insufficient documentation

## 2012-02-05 DIAGNOSIS — R1013 Epigastric pain: Secondary | ICD-10-CM

## 2012-02-05 MED ORDER — DOCUSATE SODIUM 100 MG PO CAPS: 200.0000 mg | ORAL_CAPSULE | Freq: Every day | ORAL | Status: DC

## 2012-02-05 NOTE — Patient Instructions (Signed)
Take stool softener of Colace(100 mg) 2 capsules by mouth daily at bedtime. You must walk at least 4 times a week. Start with half a mile and gradually increase distance 2 miles each time. EGD and colonoscopy to be scheduled.

## 2012-02-06 ENCOUNTER — Telehealth (INDEPENDENT_AMBULATORY_CARE_PROVIDER_SITE_OTHER): Payer: Self-pay | Admitting: *Deleted

## 2012-02-06 ENCOUNTER — Other Ambulatory Visit (INDEPENDENT_AMBULATORY_CARE_PROVIDER_SITE_OTHER): Payer: Self-pay | Admitting: *Deleted

## 2012-02-06 DIAGNOSIS — Z1211 Encounter for screening for malignant neoplasm of colon: Secondary | ICD-10-CM

## 2012-02-06 DIAGNOSIS — R1013 Epigastric pain: Secondary | ICD-10-CM

## 2012-02-06 NOTE — Telephone Encounter (Signed)
Patient needs movi prep 

## 2012-02-06 NOTE — Consult Note (Signed)
[name], [name]             ACCOUNT NO.:  [id number]  MEDICAL RECORD NO.:  [id number]  LOCATION:  XRAY                          FACILITY:  [facility]  PHYSICIAN:  Lionel December, M.D.    DATE OF BIRTH:  [birth year]  DATE OF CONSULTATION:  02/05/2012 DATE OF DISCHARGE:  11/21/2011                                CONSULTATION   PRESENTING COMPLAINT:  Epigastric pain, bloating, and fatty liver.  HISTORY OF PRESENT ILLNESS:  The patient is a 61 year old African American female who is referred through courtesy of Dr. Juanetta Gosling for GI evaluation.  She is accompanied by her sister, Benice today.  The patient has been complaining of epigastric pain for the last several weeks.  She had upper abdominal ultrasound on November 21, 2011, which was negative for cholelithiasis, but revealed diffusely echogenic liver consistent with fatty infiltration.  Bile duct diameter was 4.4 mm.  She describes this pain to be sharp pain, lasting for couple of minutes and it radiates to the right side and under the right scapula.  This pain is not associated with nausea or vomiting.  She has good appetite.  Her sister states that she has gained close to 20 pounds in the last year. Her bowels move every other day.  Her sister feels she is constipated and refuses to takes stool softener.  The patient denies melena or rectal bleeding.  She states her heartburn is well controlled with PPI, but it does not have any effect on her epigastric pain.  She does not take any NSAIDs.  She also complains of bloating which she has had for past few months.  The patient does not do any physical activity. According to her sister all she does eat and sit and watch TV.  CURRENT MEDICATIONS: 1. Aspirin 81 mg p.o. daily. 2. Fluphenazine 2.5 mg p.o. at bedtime. 3. Loratadine 10 mg p.o. daily. 4. Loxapine succinate 10 mg p.o. at bedtime. 5. Naproxen sodium 220 mg p.o. daily p.r.n. which is once a month or     less frequently. 6.  Nortriptyline 100 mg p.o. at bedtime. 7. Omeprazole 20 mg p.o. q.a.m. 8. KCl 20 mEq p.o. daily. 9. Ramelteon 8 mg p.o. at bedtime. 10.Simvastatin 40 mg p.o. daily. 11.Trazodone 150 mg p.o. at bedtime.  PAST SURGERY HISTORY:  Several year history of schizophrenia.  She had nervous breakdown in 1985.  According to her sister, she has been in remission for 5 years.  She had colonoscopy in October 2001, with removal of small polyp from the sigmoid colon which was hyperplastic. She had tubal ligation in 1980.  History of IBS.  She had symptoms of GERD for more than 5 years.  Hyperlipidemia.  Insomnia.  She has had benign lump removed from her right breast years ago.  She has decompression for right carpal tunnel and ganglion excision from left wrist.  She had fatty liver dating back to August 2007, when she had abdominopelvic CT.  ALLERGIES:  To IBUPROFEN.  FAMILY HISTORY:  Father had COPD and died at 87.  Mother died of pulmonary embolism age 15.  She had 9 siblings.  One brother died of HIV at 56.  She has 4 sisters and  4 brothers living.  One sister was treated for breast carcinoma 9 years ago and she remains in remission at 84. One sister has diabetes, others have hypertension, osteoarthrosis.  SOCIAL HISTORY:  She is single.  She has son age 69 who is in assisted living.  He has problem with nerves.  The patient has been disabled for several years.  She worked at [employer] at [employer] and [employer] center prior to getting disabled.  She has been smoking for over 20 years.  She was smoking as many as 3 packs a day and now down to 5 cigarettes per day.  She does not drink alcohol.  PHYSICAL EXAMINATION:  VITAL SIGNS:  She weighs 145.2 pounds.  She is 65 inches tall. EYES:  Conjunctivae are pink.  Sclerae are nonicteric. MOUTH:  Oropharyngeal mucosa is normal.  She has upper and lower dentures. NECK:  No neck masses or thyromegaly noted. CARDIAC:  Regular  rhythm.  Normal S1 and S2.  No murmur or gallop noted. LUNGS:  Clear to auscultation. ABDOMEN:  Protuberant, but very soft abdomen with mild midepigastric tenderness.  No organomegaly or masses. RECTAL:  Deferred. EXTREMITIES:  No peripheral edema or clubbing noted.  LABORATORY DATA:  Abdominal pelvic CT from August 2007, reviewed along with ultrasound from November 21, 2011.  Normal LFTs from August 10, 2009 and August 11, 2009.  ASSESSMENT:  The patient has following GI issues: 1. Intermittent epigastric pain.  This pain is sharp and intermittent     and not associated with nausea vomiting.  She takes naproxen     occasionally.  Ultrasound is negative for cholelithiasis.     Therefore, need to rule out peptic ulcer disease. 2. Fatty liver.  This was initially recognized on CT of August 2007.     Her LFTs previously has been normal.  We will request blood work     from Dr. Juanetta Gosling' office.  As long as LFTs are normal, she does not     need further evaluation, but she needs to increase physical     activity and gradually lose weight which might help with fatty     liver. 3. Bloating.  This is a nonspecific symptom maybe related to irritable     bowel syndrome.  She appears to be constipated and this maybe     contributing to her bloating as well.  RECOMMENDATIONS:  We will request copy of blood work from Dr. Juanetta Gosling' office for review.  The patient advised to gradually increase physical activity and try to walk 2 miles a day at least 3-4 times a week.  Diagnostic EGD followed by screening colonoscopy.  Colace 2 capsules p.o. at bedtime.  We appreciate the opportunity to participate in the care of this nice lady.  Thank you very much.          ______________________________ Lionel December, M.D.     NR/MEDQ  D:  02/05/2012  T:  02/06/2012  Job:  [id number]  cc:   Ramon Dredge L. Juanetta Gosling, M.D. Fax: [phone]

## 2012-02-07 MED ORDER — PEG-KCL-NACL-NASULF-NA ASC-C 100 G PO SOLR: 1.0000 | Freq: Once | ORAL | Status: DC

## 2012-02-08 NOTE — Progress Notes (Signed)
PRESENTING COMPLAINT: Epigastric pain, bloating, and fatty liver.  HISTORY OF PRESENT ILLNESS: The patient is a 61 year old African  American female who is referred through courtesy of Dr. Juanetta Gosling for GI  evaluation. She is accompanied by her sister, Benice today.  The patient has been complaining of epigastric pain for the last several  weeks. She had upper abdominal ultrasound on November 21, 2011, which was  negative for cholelithiasis, but revealed diffusely echogenic liver  consistent with fatty infiltration. Bile duct diameter was 4.4 mm. She  describes this pain to be sharp pain, lasting for couple of minutes and  it radiates to the right side and under the right scapula. This pain is  not associated with nausea or vomiting. She has good appetite. Her  sister states that she has gained close to 20 pounds in the last year.  Her bowels move every other day. Her sister feels she is constipated  and refuses to takes stool softener. The patient denies melena or  rectal bleeding. She states her heartburn is well controlled with PPI,  but it does not have any effect on her epigastric pain. She does not  take any NSAIDs. She also complains of bloating which she has had for  past few months. The patient does not do any physical activity.  According to her sister all she does eat and sit and watch TV.  CURRENT MEDICATIONS:  1. Aspirin 81 mg p.o. daily.  2. Fluphenazine 2.5 mg p.o. at bedtime.  3. Loratadine 10 mg p.o. daily.  4. Loxapine succinate 10 mg p.o. at bedtime.  5. Naproxen sodium 220 mg p.o. daily p.r.n. which is once a month or  less frequently.  6. Nortriptyline 100 mg p.o. at bedtime.  7. Omeprazole 20 mg p.o. q.a.m.  8. KCl 20 mEq p.o. daily.  9. Ramelteon 8 mg p.o. at bedtime.  10.Simvastatin 40 mg p.o. daily.  11.Trazodone 150 mg p.o. at bedtime.  PAST SURGERY HISTORY: Several year history of schizophrenia. She had  nervous breakdown in 1985. According to her sister, she  has been in  remission for 5 years. She had colonoscopy in October 2001, with  removal of small polyp from the sigmoid colon which was hyperplastic.  She had tubal ligation in 1980. History of IBS. She had symptoms of  GERD for more than 5 years. Hyperlipidemia. Insomnia. She has had  benign lump removed from her right breast years ago. She has  decompression for right carpal tunnel and ganglion excision from left  wrist. She had fatty liver dating back to August 2007, when she had  abdominopelvic CT.  ALLERGIES: To IBUPROFEN.  FAMILY HISTORY: Father had COPD and died at 18. Mother died of  pulmonary embolism age 51. She had 9 siblings. One brother died of HIV  at 4. She has 4 sisters and 4 brothers living. One sister was treated  for breast carcinoma 9 years ago and she remains in remission at 20.  One sister has diabetes, others have hypertension, osteoarthrosis.  SOCIAL HISTORY: She is single. She has son age 2 who is in assisted  living. He has problem with nerves. The patient has been disabled for  several years. 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 She has been smoking  for over 20 years. She was smoking as many as 3 packs a day and now  down to 5 cigarettes per day. She does not drink alcohol.  PHYSICAL  EXAMINATION: VITAL SIGNS: She weighs 145.2 pounds. She is 65  inches tall.  EYES: Conjunctivae are pink. Sclerae are nonicteric.  MOUTH: Oropharyngeal mucosa is normal. She has upper and lower  dentures.  NECK: No neck masses or thyromegaly noted.  CARDIAC: Regular rhythm. Normal S1 and S2. No murmur or gallop noted.  LUNGS: Clear to auscultation.  ABDOMEN: Protuberant, but very soft abdomen with mild midepigastric  tenderness. No organomegaly or masses.  RECTAL: Deferred.  EXTREMITIES: No peripheral edema or clubbing noted.  LABORATORY DATA: Abdominal pelvic CT from August 2007, reviewed along  with  ultrasound from November 21, 2011. Normal LFTs from August 10, 2009  and August 11, 2009.  ASSESSMENT: The patient has following GI issues:  1. Intermittent epigastric pain. This pain is sharp and intermittent  and not associated with nausea vomiting. She takes naproxen  occasionally. Ultrasound is negative for cholelithiasis.  Therefore, need to rule out peptic ulcer disease.  2. Fatty liver. This was initially recognized on CT of August 2007.  Her LFTs previously has been normal. We will request blood work  from Dr. Juanetta Gosling' office. As long as LFTs are normal, she does not  need further evaluation, but she needs to increase physical  activity and gradually lose weight which might help with fatty  liver.  3. Bloating. This is a nonspecific symptom maybe related to irritable  bowel syndrome. She appears to be constipated and this maybe  contributing to her bloating as well.  RECOMMENDATIONS: We will request copy of blood work from Dr. Juanetta Gosling'  office for review.  The patient advised to gradually increase physical activity and try to  walk 2 miles a day at least 3-4 times a week.  Diagnostic EGD followed by screening colonoscopy.  Colace 2 capsules p.o. at bedtime.  We appreciate the opportunity to participate in the care of this nice  lady.  Thank you very much.

## 2012-02-13 ENCOUNTER — Encounter (HOSPITAL_COMMUNITY): Payer: Self-pay | Admitting: Pharmacy Technician

## 2012-02-21 ENCOUNTER — Encounter (HOSPITAL_COMMUNITY)
Admission: RE | Disposition: A | Discharge status: Home / Self Care | Payer: Self-pay | Source: Ambulatory Visit | Attending: Internal Medicine

## 2012-02-21 ENCOUNTER — Ambulatory Visit (HOSPITAL_COMMUNITY)
Admission: RE | Admit: 2012-02-21 | Discharge: 2012-02-21 | Disposition: A | Discharge status: Home / Self Care | Payer: Medicare Other | Source: Ambulatory Visit | Attending: Internal Medicine | Admitting: Internal Medicine

## 2012-02-21 ENCOUNTER — Encounter (HOSPITAL_COMMUNITY): Payer: Self-pay | Admitting: *Deleted

## 2012-02-21 DIAGNOSIS — J449 Chronic obstructive pulmonary disease, unspecified: Secondary | ICD-10-CM | POA: Insufficient documentation

## 2012-02-21 DIAGNOSIS — J4489 Other specified chronic obstructive pulmonary disease: Secondary | ICD-10-CM | POA: Insufficient documentation

## 2012-02-21 DIAGNOSIS — R1013 Epigastric pain: Secondary | ICD-10-CM

## 2012-02-21 DIAGNOSIS — K2980 Duodenitis without bleeding: Secondary | ICD-10-CM | POA: Insufficient documentation

## 2012-02-21 DIAGNOSIS — D126 Benign neoplasm of colon, unspecified: Secondary | ICD-10-CM

## 2012-02-21 DIAGNOSIS — K449 Diaphragmatic hernia without obstruction or gangrene: Secondary | ICD-10-CM

## 2012-02-21 DIAGNOSIS — K219 Gastro-esophageal reflux disease without esophagitis: Secondary | ICD-10-CM | POA: Insufficient documentation

## 2012-02-21 DIAGNOSIS — Z1211 Encounter for screening for malignant neoplasm of colon: Secondary | ICD-10-CM

## 2012-02-21 HISTORY — DX: Palpitations: R00.2

## 2012-02-21 HISTORY — DX: Anemia, unspecified: D64.9

## 2012-02-21 HISTORY — PX: COLONOSCOPY WITH ESOPHAGOGASTRODUODENOSCOPY (EGD): SHX5779

## 2012-02-21 SURGERY — COLONOSCOPY WITH ESOPHAGOGASTRODUODENOSCOPY (EGD)
Anesthesia: Moderate Sedation

## 2012-02-21 MED ORDER — MIDAZOLAM HCL 5 MG/5ML IJ SOLN
INTRAMUSCULAR | Status: DC | PRN
  Administered 2012-02-21 (×3): 2 mg via INTRAVENOUS

## 2012-02-21 MED ORDER — SODIUM CHLORIDE 0.45 % IV SOLN
INTRAVENOUS | Status: DC
  Administered 2012-02-21: 14:00:00 via INTRAVENOUS

## 2012-02-21 MED ORDER — STERILE WATER FOR IRRIGATION IR SOLN
Status: DC | PRN
  Administered 2012-02-21: 15:00:00

## 2012-02-21 MED ORDER — MIDAZOLAM HCL 5 MG/5ML IJ SOLN
INTRAMUSCULAR | Status: AC
  Filled 2012-02-21: qty 10

## 2012-02-21 MED ORDER — MEPERIDINE HCL 50 MG/ML IJ SOLN
INTRAMUSCULAR | Status: AC
  Filled 2012-02-21: qty 1

## 2012-02-21 MED ORDER — MEPERIDINE HCL 50 MG/ML IJ SOLN
INTRAMUSCULAR | Status: DC | PRN
  Administered 2012-02-21 (×2): 25 mg via INTRAVENOUS

## 2012-02-21 MED ORDER — BUTAMBEN-TETRACAINE-BENZOCAINE 2-2-14 % EX AERO
INHALATION_SPRAY | CUTANEOUS | Status: DC | PRN
  Administered 2012-02-21: 2 via TOPICAL

## 2012-02-21 NOTE — H&P (Signed)
[name] is an 61 y.o. female.   Chief Complaint: Patient is here for EGD and colonoscopy. HPI: [name] is 61 year old African female who presents with recurrent epigastric pain and bloating. She has chronic GERD and maintenance PPI. She states heartburn is well controlled with therapy the pain is not. She denies vomiting or melena. She she is also undergoing colonoscopy for screening purposes. Reason for to my note from the 02/05/2012 for details of her history.  Past Medical History  Diagnosis Date  . Allergic rhinitis   . Bipolar disorder   . GERD (gastroesophageal reflux disease)   . Hypopotassemia   . Hyperlipemia   . COPD (chronic obstructive pulmonary disease)   . Heart palpitations   . Anemia   . 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 Sterilization   . Colonoscopy   . Upper gastrointestinal endoscopy   . Dilation and curettage of uterus     Family History  Problem Relation Age of Onset  . Seizures Mother   . COPD Father   . Hypertension Sister   . COPD Sister   . Arthritis Sister   . Diabetes Brother   . Hypertension Sister   . Breast cancer Sister     51 year survivor  . Arthritis Sister   . Thyroid disease Sister   . Arthritis Sister   . Diabetes Sister   . Arthritis Sister   . Heart disease Brother   . Healthy Brother   . Healthy Brother   . Healthy Son    Social History:  reports that she has been smoking.  She has never used smokeless tobacco. She reports that she does not drink alcohol or use illicit drugs.  Allergies:  Allergies  Allergen Reactions  . Ibuprofen Nausea Only  . Other Other (See Comments)    Ragweed Pepper - Sneezing    Medications Prior to Admission  Medication Sig Dispense Refill  . aspirin 81 MG EC tablet Take 81 mg by mouth daily. Swallow whole.      . docusate sodium (COLACE) 100 MG capsule Take 2 capsules (200 mg total) by mouth at  bedtime.  10 capsule  0  . fluPHENAZine (PROLIXIN) 2.5 MG tablet Take 2.5 mg by mouth at bedtime.      Marland Kitchen loratadine (CLARITIN) 10 MG tablet Take 10 mg by mouth daily.      Marland Kitchen loxapine (LOXITANE) 10 MG capsule Take 10 mg by mouth at bedtime.      . naproxen sodium (ALEVE) 220 MG tablet Take 220 mg by mouth as needed. Pain.      . nortriptyline (PAMELOR) 50 MG capsule Take 100 mg by mouth at bedtime.       Marland Kitchen omeprazole (PRILOSEC) 20 MG capsule Take 20 mg by mouth daily.      . peg 3350 powder (MOVIPREP) 100 G SOLR Take 1 kit (100 g total) by mouth once.  1 kit  0  . POTASSIUM CHLORIDE PO Take 20 mEq by mouth daily.      . ramelteon (ROZEREM) 8 MG tablet Take 8 mg by mouth at bedtime.      . simvastatin (ZOCOR) 40 MG tablet Take 40 mg by mouth daily.      . traZODone (DESYREL) 150 MG tablet Take 150 mg by mouth at bedtime.        No results found for this or any  previous visit (from the past 48 hour(s)). No results found.  ROS  Blood pressure 140/82, temperature 98.5 F (36.9 C), temperature source Oral, resp. rate 22, height 5\' 5"  (1.651 m), weight 140 lb (63.504 kg), SpO2 91.00%. Physical Exam  Constitutional: She appears well-developed and well-nourished.  HENT:  Mouth/Throat: Oropharynx is clear and moist.  Eyes: Conjunctivae normal are normal. No scleral icterus.  Neck: No thyromegaly present.  Cardiovascular: Normal rate, regular rhythm and normal heart sounds.   No murmur heard. Respiratory: Effort normal.  GI: Soft. She exhibits no distension and no mass.  Musculoskeletal: She exhibits no edema.  Lymphadenopathy:    She has no cervical adenopathy.  Neurological: She is alert.  Skin: Skin is warm and dry.     Assessment/Plan Epigastric pain and bloating. Diagnostic EGD and screening colonoscopy.  [name] U 02/21/2012, 2:28 PM

## 2012-02-21 NOTE — Op Note (Signed)
EGD PROCEDURE REPORT  PATIENT:  [name]  MR#:  [id number] Birthdate:  07-31-1950, 61 y.o., female Endoscopist:  Dr. Malissa Hippo, MD Referred By:  Dr. Bonnetta Barry ref. provider found Procedure Date: 02/21/2012  Procedure:   EGD & Colonoscopy  Indications:  Patient is 61 year old African female with chronic GERD who has recurrent epigastric pain and bloating. She is undergoing diagnostic EGD followed by screening colonoscopy.            Informed Consent:  The risks, benefits, alternatives & imponderables which include, but are not limited to, bleeding, infection, perforation, drug reaction and potential missed lesion have been reviewed.  The potential for biopsy, lesion removal, esophageal dilation, etc. have also been discussed.  Questions have been answered.  All parties agreeable.  Please see history & physical in medical record for more information.  Medications:  Demerol 50 mg IV Versed 6 mg IV Cetacaine spray topically for oropharyngeal anesthesia  EGD  Description of procedure:  The endoscope was introduced through the mouth and advanced to the second portion of the duodenum without difficulty or limitations. The mucosal surfaces were surveyed very carefully during advancement of the scope and upon withdrawal.  Findings:  Esophagus:  Mucosa of the esophagus was normal. GE junction was unremarkable. GEJ:  36 sig cm Hiatus: 38 cm Stomach:  Stomach was empty and distended very well with insufflation. Folds in the proximal stomach are normal. Examination of mucosa at body, antrum, pyloric channel, angularis, fundus and cardia was normal. Duodenum:  Patchy bulbar edema and erythema noted. No erosions or ulcers are present. Post bulbar mucosa was normal.  Therapeutic/Diagnostic Maneuvers Performed:  None  COLONOSCOPY Description of procedure:  After a digital rectal exam was performed, that colonoscope was advanced from the anus through the rectum and colon to the area of the cecum,  ileocecal valve and appendiceal orifice. The cecum was deeply intubated. These structures were well-seen and photographed for the record. From the level of the cecum and ileocecal valve, the scope was slowly and cautiously withdrawn. The mucosal surfaces were carefully surveyed utilizing scope tip to flexion to facilitate fold flattening as needed. The scope was pulled down into the rectum where a thorough exam including retroflexion was performed.  Findings:   Prep excellent set she had few green peas in her transverse colon. Four polyps snared from proximal transverse colon and submitted together. One was 7-8 mm and the others 5 mm in diameter. Mucosa of rest of the colon and rectum was normal. Unremarkable anorectal junction.  Therapeutic/Diagnostic Maneuvers Performed:  See above  Complications:  None  Cecal Withdrawal Time:  7 minutes  Impression:  Small sliding hiatal hernia and bulbar duodenitis but no evidence of peptic ulcer. 4 polyps snared from proximal transverse colon and submitted together. Largest of these was 7-8 mm and others were 5 mm each.  Recommendations:  Standard instructions given. No aspirin or NSAIDs for one week. H. pylori serology. I will be contacting patient with results of biopsy and blood work.  REHMAN,NAJEEB U  02/21/2012 3:15 PM  CC: Dr. [name], MD & Dr. Bonnetta Barry ref. provider found

## 2012-02-22 LAB — H. PYLORI ANTIBODY, IGG: H Pylori IgG: 0.75 {ISR}

## 2012-02-26 ENCOUNTER — Other Ambulatory Visit (HOSPITAL_COMMUNITY): Payer: Self-pay | Admitting: Pulmonary Disease

## 2012-02-26 ENCOUNTER — Encounter (HOSPITAL_COMMUNITY): Payer: Self-pay | Admitting: Internal Medicine

## 2012-02-26 DIAGNOSIS — N6310 Unspecified lump in the right breast, unspecified quadrant: Secondary | ICD-10-CM

## 2012-03-07 ENCOUNTER — Encounter (INDEPENDENT_AMBULATORY_CARE_PROVIDER_SITE_OTHER): Payer: Self-pay | Admitting: *Deleted

## 2012-03-18 ENCOUNTER — Telehealth (INDEPENDENT_AMBULATORY_CARE_PROVIDER_SITE_OTHER): Payer: Self-pay | Admitting: General Surgery

## 2012-03-18 NOTE — Telephone Encounter (Signed)
Message sent back stating we can not talk to a family member without a HIPPA release.

## 2012-03-18 NOTE — Telephone Encounter (Signed)
Message copied by Liliana Cline on Tue Mar 18, 2012  3:47 PM ------      Message from: Cathi Roan      Created: Tue Mar 18, 2012  3:42 PM      Regarding: Appt      Contact: [phone]       Call between 8-5 Kandice Robinsons (brother) has questions about sisters appoint. Would like to be here but can not.

## 2012-03-19 ENCOUNTER — Encounter (INDEPENDENT_AMBULATORY_CARE_PROVIDER_SITE_OTHER): Payer: Self-pay | Admitting: General Surgery

## 2012-03-19 ENCOUNTER — Ambulatory Visit (INDEPENDENT_AMBULATORY_CARE_PROVIDER_SITE_OTHER): Payer: Medicaid Other | Admitting: Surgery

## 2012-03-19 ENCOUNTER — Encounter (INDEPENDENT_AMBULATORY_CARE_PROVIDER_SITE_OTHER): Payer: Self-pay | Admitting: Surgery

## 2012-03-19 VITALS — BP 138/80 | HR 80 | Temp 97.6°F | Resp 25 | Ht 65.0 in | Wt 147.0 lb

## 2012-03-19 DIAGNOSIS — C50919 Malignant neoplasm of unspecified site of unspecified female breast: Secondary | ICD-10-CM

## 2012-03-19 DIAGNOSIS — C50911 Malignant neoplasm of unspecified site of right female breast: Secondary | ICD-10-CM | POA: Insufficient documentation

## 2012-03-19 MED ORDER — PROMETHAZINE HCL 25 MG PO TABS: 25.0000 mg | ORAL_TABLET | Freq: Four times a day (QID) | ORAL | Status: DC | PRN

## 2012-03-19 MED ORDER — HYDROCODONE-ACETAMINOPHEN 5-325 MG PO TABS: 1.0000 | ORAL_TABLET | Freq: Four times a day (QID) | ORAL | Status: DC | PRN

## 2012-03-19 NOTE — Patient Instructions (Signed)
We will arrange scans to see if there is spread of the cancer and an appointment with Dr Mariel Sleet

## 2012-03-19 NOTE — Progress Notes (Signed)
Faxed mammogram imaging results and pathology from [facility] to the attention of Dr. Glenford Peers per Dr. Jamey Ripa. Confirmation received. Information sent to medical records to be scanned into the chart.

## 2012-03-19 NOTE — Progress Notes (Signed)
Patient ID: [name], female   DOB: 11-21-1950, 61 y.o.   MRN: [id number]  Chief Complaint  Patient presents with  . Breast Cancer    right    HPI [name] is a 61 y.o. female.  She is referred by Dr Juanetta Gosling for a recently diagnosed right breast cancer. She has had some psychiatric isues and it is dificult to obtain a clear history.She is here with her three sisters, one of home is a 9 year breast cancer survivor.  The patient felt a mass in the right breast, a mammogram, ultrasoound, and biopsy followed. The biopsy shows IDC, receptor neg, with a Her 2 that was borderline, with repeat negative.  The patient notes that the mass has gotten more painful over the last month. It is not clear when she first noticed it. HPI  Past Medical History  Diagnosis Date  . Allergic rhinitis   . Bipolar disorder   . GERD (gastroesophageal reflux disease)   . Hypopotassemia   . Hyperlipemia   . COPD (chronic obstructive pulmonary disease)   . Heart palpitations   . Anemia   . 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 Sterilization   . Colonoscopy   . Upper gastrointestinal endoscopy   . Dilation and curettage of uterus   . Colonoscopy with esophagogastroduodenoscopy (egd) 02/21/2012    Procedure: COLONOSCOPY WITH ESOPHAGOGASTRODUODENOSCOPY (EGD);  Surgeon: Malissa Hippo, MD;  Location: [facility];  Service: Endoscopy;  Laterality: N/A;  2:25    Family History  Problem Relation Age of Onset  . Seizures Mother   . COPD Father   . Hypertension Sister   . COPD Sister   . Arthritis Sister   . Diabetes Brother   . Hypertension Sister   . Breast cancer Sister     74 year survivor  . Arthritis Sister   . Thyroid disease Sister   . Arthritis Sister   . Diabetes Sister   . Arthritis Sister   . Heart disease Brother   . Healthy Brother   . Healthy Brother   . Healthy Son     Social  History History  Substance Use Topics  . Smoking status: Current Every Day Smoker -- 0.2 packs/day for 5 years  . Smokeless tobacco: Never Used     Comment: 5 cigarettes a day   . Alcohol Use: No    Allergies  Allergen Reactions  . Ibuprofen Nausea Only  . Other Other (See Comments)    Ragweed Pepper - Sneezing    Current Outpatient Prescriptions  Medication Sig Dispense Refill  . docusate sodium (COLACE) 100 MG capsule Take 2 capsules (200 mg total) by mouth at bedtime.  10 capsule  0  . fluPHENAZine (PROLIXIN) 2.5 MG tablet Take 2.5 mg by mouth at bedtime.      Marland Kitchen loratadine (CLARITIN) 10 MG tablet Take 10 mg by mouth daily.      Marland Kitchen loxapine (LOXITANE) 10 MG capsule Take 10 mg by mouth at bedtime.      . nortriptyline (PAMELOR) 50 MG capsule Take 100 mg by mouth at bedtime.       Marland Kitchen omeprazole (PRILOSEC) 20 MG capsule Take 20 mg by mouth daily.      Marland Kitchen POTASSIUM CHLORIDE PO Take 20 mEq by mouth daily.      . ramelteon (ROZEREM) 8 MG tablet Take 8 mg by  mouth at bedtime.      . simvastatin (ZOCOR) 40 MG tablet Take 40 mg by mouth daily.      . traZODone (DESYREL) 150 MG tablet Take 150 mg by mouth at bedtime.        Review of Systems Review of Systems  Constitutional: Negative for fever, chills and unexpected weight change.  HENT: Negative for hearing loss, congestion, sore throat, trouble swallowing and voice change.   Eyes: Negative for visual disturbance.  Respiratory: Positive for cough and wheezing.   Cardiovascular: Negative for chest pain, palpitations and leg swelling.  Gastrointestinal: Positive for abdominal pain and constipation. Negative for nausea, vomiting, diarrhea, blood in stool, abdominal distention and anal bleeding.  Genitourinary: Negative for hematuria, vaginal bleeding and difficulty urinating.  Musculoskeletal: Negative for arthralgias.  Skin: Negative for rash and wound.  Neurological: Negative for seizures, syncope and headaches.  Hematological:  Negative for adenopathy. Does not bruise/bleed easily.  Psychiatric/Behavioral: Negative for confusion.    Blood pressure 138/80, pulse 80, temperature 97.6 F (36.4 C), temperature source Temporal, resp. rate 25, height 5\' 5"  (1.651 m), weight 147 lb (66.679 kg).  Physical Exam Physical Exam  Vitals reviewed. Constitutional: She is oriented to person, place, and time. She appears well-developed and well-nourished. No distress.  HENT:  Head: Normocephalic and atraumatic.  Mouth/Throat: Oropharynx is clear and moist.  Eyes: Conjunctivae normal and EOM are normal. Pupils are equal, round, and reactive to light. No scleral icterus.  Neck: Normal range of motion. Neck supple. No tracheal deviation present. No thyromegaly present.  Cardiovascular: Normal rate, regular rhythm, normal heart sounds and intact distal pulses.  Exam reveals no gallop and no friction rub.   No murmur heard. Pulmonary/Chest: Effort normal and breath sounds normal. No respiratory distress. She has no wheezes. She has no rales.         Left breast is normal. Right has a large, 5 cm mass involving the lower inner quadrant, extending to the inframammary fold, and with skin involvement. The skin is red, suggesting inflammatory cancer. It feels fixed to the chest wall.  Abdominal: Soft. Bowel sounds are normal. She exhibits no distension and no mass. There is no tenderness. There is no rebound and no guarding.  Musculoskeletal: Normal range of motion. She exhibits no edema and no tenderness.  Lymphadenopathy:    She has no cervical adenopathy.       Right: No supraclavicular adenopathy present.       Left: No supraclavicular adenopathy present.       Tender right axillary area, not a clearly palpable node  Neurological: She is alert and oriented to person, place, and time.  Skin: Skin is warm and dry. No rash noted. She is not diaphoretic. No erythema.  Psychiatric: She has a normal mood and affect. Her behavior is  normal. Judgment and thought content normal.    Data Reviewed I have reviewed information currently in Epic, and the radiology and pathology reports. Films are not available for review  Assessment    Clinical stage III, IDC, right breast, lower inner quadrant. This is not currently readily amenable to a surgical resection - would require skin graft or flap to close.    Plan    I have explained the pathophysiology and staging of breast cancer with particular attention to her exact situation. We discussed the multidisciplinary approach to breast cancer which often includes both medical and radiation oncology consultations.  We also discussed surgical options for the treatment of breast  cancer including lumpectomy and mastectomy with possible reconstructive surgery. In addition we talked about the evaluation and management of lymph nodes including a description of sentinel lymph node biopsy and axillary dissections. We reviewed potential complications and risks including bleeding, infection, numbness,  lymphedema, and the potential need for additional surgery.  She understands that for patients who are candidate for lumpectomy or mastectomy there is an equal survival rate with either technique, but a slightly higher local recurrence rate with lumpectomy. In addition she knows that a lumpectomy usually requires postoperative radiation as part of the management of the breast cancer.  We have discussed the likely postoperative course and plans for followup.  I have given the patient some written information that reviewed all of these issues. I believe her questions are answered and that she and her family have a good understanding of the issues.  I told them that the next step should be a medical oncology consultation and some staging scans. An MRI of the breast might be helpful to assess chest wall invasion. I told them the likely first modality of treatment will be chemotherapy.  One of the  sisters lives in [place] and requests that she see Dr Mariel Sleet if available. We will contact him and also try to expedite scans as he wishes.   Gave her an Rx for Vicodin and phenergan for the pain (and potential nausea from the phenergan.        [name] J 03/19/2012, 5:12 PM

## 2012-03-20 ENCOUNTER — Telehealth (HOSPITAL_COMMUNITY): Payer: Self-pay | Admitting: Oncology

## 2012-03-20 ENCOUNTER — Telehealth (INDEPENDENT_AMBULATORY_CARE_PROVIDER_SITE_OTHER): Payer: Self-pay | Admitting: General Surgery

## 2012-03-20 NOTE — Telephone Encounter (Signed)
Called and spoke with Angie and she will make sure Dr Mariel Sleet sees that message.

## 2012-03-20 NOTE — Telephone Encounter (Signed)
Message copied by Liliana Cline on Thu Mar 20, 2012  1:34 PM ------      Message from: Currie Paris      Created: Thu Mar 20, 2012  9:07 AM       Lesly Rubenstein,      I sent a staff message to Dr Mariel Sleet about her, but please call the office to be sure he sees it. She will need some scans scheduled, probably a PET, but wanted him to decide. No surgery as this will need chemo

## 2012-03-21 ENCOUNTER — Encounter (INDEPENDENT_AMBULATORY_CARE_PROVIDER_SITE_OTHER): Payer: Self-pay

## 2012-03-24 ENCOUNTER — Telehealth (INDEPENDENT_AMBULATORY_CARE_PROVIDER_SITE_OTHER): Payer: Self-pay | Admitting: General Surgery

## 2012-03-24 DIAGNOSIS — C50919 Malignant neoplasm of unspecified site of unspecified female breast: Secondary | ICD-10-CM

## 2012-03-24 NOTE — Telephone Encounter (Signed)
Message copied by Liliana Cline on Mon Mar 24, 2012  9:00 AM ------      Message from: Currie Paris      Created: Fri Mar 21, 2012  4:06 PM                   ----- Message -----         From: Randall An, MD         Sent: 03/21/2012   3:51 PM           To: Currie Paris, MD            2-D echo would be helpful as well as a bone scan      Great      ----- Message -----         From: Currie Paris, MD         Sent: 03/21/2012  12:35 PM           To: Randall An, MD            Yes, anything else???      ----- Message -----         From: Randall An, MD         Sent: 03/21/2012  11:12 AM           To: Currie Paris, MD            Thanks, Thayer Ohm      Could your staff get a PET?      Thanks      Minerva Areola      ----- Message -----         From: Currie Paris, MD         Sent: 03/20/2012   6:09 AM           To: Randall An, MD            Minerva Areola,      I saw this nice lady late yesterday. She has a large breast cancer that appears inflammatory and likely fixed to the chest wall. She had films and Bx at [facility] patient), but her sister, who will be helping with her care, is in [place] and they asked for you.            She will need some staging scans, which we can arrange if you let me know what you want.            She does have some psychiatric issues, so family comes with her for support.            Thanks

## 2012-03-24 NOTE — Telephone Encounter (Signed)
Spoke with patient's sister and given appts. Bone scan scheduled for Tuesday 03/25/12 arrive at 10:45am. PET scan scheduled Thursday 03/27/12 @ 12:50 pm. 2D Echo scheduled 04/02/12 @ 11:00 am. All at [facility]. They will call with any questions.

## 2012-03-25 ENCOUNTER — Encounter (HOSPITAL_COMMUNITY): Payer: Medicare Other | Attending: Oncology | Admitting: Oncology

## 2012-03-25 ENCOUNTER — Encounter (HOSPITAL_COMMUNITY): Admission: RE | Admit: 2012-03-25 | Payer: Medicare Other | Source: Ambulatory Visit

## 2012-03-25 ENCOUNTER — Ambulatory Visit (HOSPITAL_COMMUNITY): Admission: RE | Admit: 2012-03-25 | Payer: Medicare Other | Source: Ambulatory Visit

## 2012-03-25 ENCOUNTER — Encounter (HOSPITAL_COMMUNITY): Payer: Self-pay | Admitting: Oncology

## 2012-03-25 VITALS — BP 136/82 | HR 113 | Temp 98.5°F | Resp 18 | Ht 65.0 in | Wt 148.7 lb

## 2012-03-25 DIAGNOSIS — F17200 Nicotine dependence, unspecified, uncomplicated: Secondary | ICD-10-CM

## 2012-03-25 DIAGNOSIS — J449 Chronic obstructive pulmonary disease, unspecified: Secondary | ICD-10-CM | POA: Insufficient documentation

## 2012-03-25 DIAGNOSIS — J4489 Other specified chronic obstructive pulmonary disease: Secondary | ICD-10-CM | POA: Insufficient documentation

## 2012-03-25 DIAGNOSIS — E785 Hyperlipidemia, unspecified: Secondary | ICD-10-CM | POA: Insufficient documentation

## 2012-03-25 DIAGNOSIS — C50319 Malignant neoplasm of lower-inner quadrant of unspecified female breast: Secondary | ICD-10-CM | POA: Insufficient documentation

## 2012-03-25 DIAGNOSIS — K7689 Other specified diseases of liver: Secondary | ICD-10-CM | POA: Insufficient documentation

## 2012-03-25 DIAGNOSIS — C50911 Malignant neoplasm of unspecified site of right female breast: Secondary | ICD-10-CM

## 2012-03-25 DIAGNOSIS — Z171 Estrogen receptor negative status [ER-]: Secondary | ICD-10-CM

## 2012-03-25 DIAGNOSIS — F319 Bipolar disorder, unspecified: Secondary | ICD-10-CM | POA: Insufficient documentation

## 2012-03-25 NOTE — Patient Instructions (Addendum)
[facility] Discharge Instructions  RECOMMENDATIONS MADE BY THE CONSULTANT AND ANY TEST RESULTS WILL BE SENT TO YOUR REFERRING PHYSICIAN.  EXAM FINDINGS BY THE PHYSICIAN TODAY AND SIGNS OR SYMPTOMS TO REPORT TO CLINIC OR PRIMARY PHYSICIAN: Exam and discussion by MD.  Plans are to give intravenous chemotherapy to decrease the size of your tumor before surgery.  Need to get some additional studies performed and get you scheduled for chemotherapy teaching and port a catheter placement.  We will call tomorrow with the date and time for your scans and teaching.  We hope to get you started on therapy next week.  We plan to treat you with Adriamycin and Cytoxan every 2 weeks then Taxol on a weekly basis.  For questions or concerns contact Tobie Lords, RN [phone] (here Mondays, Wednesdays and Fridays) MEDICATIONS PRESCRIBED:  none    SPECIAL INSTRUCTIONS/FOLLOW-UP: To be arranged.  Thank you for choosing [facility] to provide your oncology and hematology care.  To afford each patient quality time with our providers, please arrive at least 15 minutes before your scheduled appointment time.  With your help, our goal is to use those 15 minutes to complete the necessary work-up to ensure our physicians have the information they need to help with your evaluation and healthcare recommendations.    Effective January 1st, 2014, we ask that you re-schedule your appointment with our physicians should you arrive 10 or more minutes late for your appointment.  We strive to give you quality time with our providers, and arriving late affects you and other patients whose appointments are after yours.    Again, thank you for choosing [facility].  Our hope is that these requests will decrease the amount of time that you wait before being seen by our physicians.       _____________________________________________________________  I acknowledge that I have been  informed and understand all the instructions given to me and received a copy. I do not have anymore questions at this time but understand that I may call the Cancer Center at [facility] at [phone] during business hours should I have any further questions or need assistance in obtaining follow-up care.    __________________________________________  _____________  __________ Signature of Patient or Authorized Representative            Date                   Time    __________________________________________ Nurse's Signature

## 2012-03-25 NOTE — Progress Notes (Signed)
Problem #1 inflammatory carcinoma the right breast with an 8 x 6 cm mass lower inner quadrant with approximately 40% of her breast involved by erythema and skin involvement. This extends down to the inframammary fold medially and inferiorly. It extends around the entire nipple areola complex. Problem #2 bipolar disorder as well as possible mental retardation  schizophrenia Problem #3 long-standing smoking history consistent with COPD Problem #4 history of tubal ligation at 2 pregnancy labor and delivery at age 50 Problem #5 history of benign lump removed by Dr. Lovell Sheehan from the right breast years ago Problem #6 hyperlipidemia Problem #7 fatty liver This 61 year old African American woman is accompanied by her 4 sisters. It is not clear when this mass started in her right breast but became obvious when one of her sisters help her a bath within the last 2 weeks. Her oldest sister has power of attorney. She was taken to her primary care physician who referred her for mammography and biopsy of the breast which was performed. This was done in [place]. It is essentially a triple negative breast cancer. Her sister thinks it has grown already in 2 weeks since she discovered it. The patient did not complain of this before. It is slightly uncomfortable at times she states. Her weight is stable. Her appetite is stable. She denies headaches nausea or vomiting. She is not aware of any lumps anywhere else. She has no new pains anywhere else. She states her bowels are working well. She does not use alcohol. She does not use drugs. She does smoke 5 or more cigarettes per day and has done so for many years. BP 136/82  Pulse 113  Temp 98.5 F (36.9 C) (Oral)  Resp 18  Ht 5\' 5"  (1.651 m)  Wt 148 lb 11.2 oz (67.45 kg)  BMI 24.75 kg/m2  She is in no acute distress. She has no thyromegaly. She has upper and lower dental plates. Pupils are equally round and reactive to light and she has bilateral cataracts. She appears  alert and oriented but very very quiet and reticent. She has a negative left breast exam. The right breast exam is mentioned above. There is no lymphadenopathy in the cervical, supraclavicular, infraclavicular, axillary, or inguinal areas. She has no abdominal distention. She has no abdominal organomegaly. Bowel sounds are diminished but present. She has no leg edema. His no arm edema. Lungs are clear but with diminished breath sounds throughout. Facial symmetry is intact. She does answer questions appropriately but very very slowly.  I think she has a large inner lower quadrant breast cancer with some inflammatory complement consistent with early inflammatory carcinoma the breast. She would benefit from chemotherapy prior to surgery but we'll need radiation therapy and we will arrange the radiation therapy consultation. She needs a PET scan which is scheduled, bone scan which will be scheduled. She will have a 2-D echo Friday morning. We will need to get a Port-A-Cath placed as soon as possible and treat her next week. I will start with Adriamycin and Cytoxan every 2 weeks in a dose dense fashion followed by weekly paclitaxel for 12 weeks if all goes well.

## 2012-03-26 ENCOUNTER — Telehealth (HOSPITAL_COMMUNITY): Payer: Self-pay

## 2012-03-26 NOTE — Telephone Encounter (Signed)
Message copied by Evelena Leyden on Wed Mar 26, 2012  3:45 PM ------      Message from: Mariel Sleet, ERIC S      Created: Wed Mar 26, 2012  3:20 PM       Call Dr Tenna Child nurse and see if he found someone to do it in his place since he said today he can't.  He said he would check with his schedulers!!      ----- Message -----         From: Evelena Leyden, RN         Sent: 03/26/2012  12:44 PM           To: Randall An, MD            We don't do via IR here.  What do you want [place] to do?        Fannie Knee      ----- Message -----         From: Randall An, MD         Sent: 03/25/2012   5:14 PM           To: Evelena Leyden, RN            Do we do them here????      ----- Message -----         From: Currie Paris, MD         Sent: 03/25/2012   5:11 PM           To: Randall An, MD            I'm really full next week - she might do just as well having interventional do it up there.            If that's not good, I can see if one of my pards can do early next week      ----- Message -----         From: Randall An, MD         Sent: 03/25/2012   5:02 PM           To: Currie Paris, MD            Could you place port next Tuesday or Wed so we can treat the next day??      See you tomorrow      Minerva Areola

## 2012-03-26 NOTE — Telephone Encounter (Signed)
Spoke with Dr. Tenna Child office and they will take care of this and this was discussed with Ms. Gilden's sister [name].

## 2012-03-27 ENCOUNTER — Encounter (HOSPITAL_COMMUNITY)
Admission: RE | Admit: 2012-03-27 | Discharge: 2012-03-27 | Disposition: A | Discharge status: Home / Self Care | Payer: Medicare Other | Source: Ambulatory Visit | Attending: Surgery | Admitting: Surgery

## 2012-03-27 ENCOUNTER — Other Ambulatory Visit (HOSPITAL_COMMUNITY): Payer: Medicare Other

## 2012-03-27 DIAGNOSIS — C50919 Malignant neoplasm of unspecified site of unspecified female breast: Secondary | ICD-10-CM | POA: Insufficient documentation

## 2012-03-27 LAB — GLUCOSE, CAPILLARY: Glucose-Capillary: 240 mg/dL — ABNORMAL HIGH (ref 70–99)

## 2012-03-27 MED ORDER — FLUDEOXYGLUCOSE F - 18 (FDG) INJECTION: 19.4000 | Freq: Once | INTRAVENOUS | Status: AC | PRN

## 2012-03-27 MED ORDER — FLUDEOXYGLUCOSE F - 18 (FDG) INJECTION
19.4000 | Freq: Once | INTRAVENOUS | Status: AC | PRN
  Administered 2012-03-27: 19.4 via INTRAVENOUS

## 2012-03-28 ENCOUNTER — Ambulatory Visit (HOSPITAL_COMMUNITY)
Admission: RE | Admit: 2012-03-28 | Discharge: 2012-03-28 | Disposition: A | Discharge status: Home / Self Care | Payer: Medicare Other | Source: Ambulatory Visit | Attending: Surgery | Admitting: Surgery

## 2012-03-28 ENCOUNTER — Telehealth (HOSPITAL_COMMUNITY): Payer: Self-pay

## 2012-03-28 ENCOUNTER — Ambulatory Visit (HOSPITAL_COMMUNITY): Payer: Medicare Other

## 2012-03-28 ENCOUNTER — Telehealth (HOSPITAL_COMMUNITY): Payer: Self-pay | Admitting: *Deleted

## 2012-03-28 ENCOUNTER — Telehealth (INDEPENDENT_AMBULATORY_CARE_PROVIDER_SITE_OTHER): Payer: Self-pay | Admitting: General Surgery

## 2012-03-28 ENCOUNTER — Encounter (HOSPITAL_COMMUNITY): Payer: Self-pay

## 2012-03-28 ENCOUNTER — Encounter (HOSPITAL_COMMUNITY)
Admission: RE | Admit: 2012-03-28 | Discharge: 2012-03-28 | Disposition: A | Discharge status: Home / Self Care | Payer: Medicare Other | Source: Ambulatory Visit | Attending: Surgery | Admitting: Surgery

## 2012-03-28 DIAGNOSIS — C50919 Malignant neoplasm of unspecified site of unspecified female breast: Secondary | ICD-10-CM | POA: Insufficient documentation

## 2012-03-28 DIAGNOSIS — I319 Disease of pericardium, unspecified: Secondary | ICD-10-CM | POA: Insufficient documentation

## 2012-03-28 DIAGNOSIS — I059 Rheumatic mitral valve disease, unspecified: Secondary | ICD-10-CM | POA: Insufficient documentation

## 2012-03-28 DIAGNOSIS — I517 Cardiomegaly: Secondary | ICD-10-CM

## 2012-03-28 DIAGNOSIS — F17200 Nicotine dependence, unspecified, uncomplicated: Secondary | ICD-10-CM | POA: Insufficient documentation

## 2012-03-28 HISTORY — DX: Malignant (primary) neoplasm, unspecified: C80.1

## 2012-03-28 MED ORDER — TECHNETIUM TC 99M MEDRONATE IV KIT
25.0000 | PACK | Freq: Once | INTRAVENOUS | Status: AC | PRN
  Administered 2012-03-28: 25.5 via INTRAVENOUS

## 2012-03-28 NOTE — Telephone Encounter (Signed)
Per Dr. Tenna Child office, they will not be able to place a port a catheter next week. They recommend that we either get it scheduled with Interventional Radiology or have one of the surgeons here in [place] to insert the port a cath.  Discussed with Mrs. Jean Rosenthal and she will talk with the patient and other family members and call us back on Monday with their decision.

## 2012-03-28 NOTE — Telephone Encounter (Signed)
[name] is out of town and none of the other surgeons there can place port a cath in next week. Their suggestion was to order port thru IR.  Dr. Mariel Sleet notified and we will check with pt/family to see if they prefer IR or local Surgeon. Tobie Lords will call patient's sister.

## 2012-03-28 NOTE — Progress Notes (Signed)
*  PRELIMINARY RESULTS* Echocardiogram 2D Echocardiogram has been performed.  [name] 03/28/2012, 9:01 AM

## 2012-03-28 NOTE — Telephone Encounter (Signed)
Call back from Mrs. Jean Rosenthal and she has talked with her sisters and they want to go with whatever surgeon in [place] that Dr. Mariel Sleet feels comfortable with to put in Ms. Ronk's port.

## 2012-03-28 NOTE — Telephone Encounter (Signed)
Spoke with Tammy, Dr. Thornton Papas nurse, and explained that this patient needs a PAC placed early next week and that Dr. Jamey Ripa is out of town and non of his partners have OR time available to do it.  Asked them to put in the IR orders to have this done.  She said she would inform Dr. Mariel Sleet and he would either put in the orders or have one of the surgeons locally do it.

## 2012-03-31 ENCOUNTER — Inpatient Hospital Stay (HOSPITAL_COMMUNITY): Payer: Medicare Other

## 2012-03-31 ENCOUNTER — Encounter (HOSPITAL_COMMUNITY): Payer: Self-pay | Admitting: Pharmacy Technician

## 2012-03-31 ENCOUNTER — Encounter (HOSPITAL_COMMUNITY): Payer: Medicare Other

## 2012-03-31 MED ORDER — LORAZEPAM 1 MG PO TABS: 1.0000 mg | ORAL_TABLET | ORAL | Status: DC | PRN

## 2012-03-31 MED ORDER — PROCHLORPERAZINE 25 MG RE SUPP: 25.0000 mg | Freq: Four times a day (QID) | RECTAL | Status: DC | PRN

## 2012-03-31 MED ORDER — DEXAMETHASONE 4 MG PO TABS: ORAL_TABLET | ORAL | Status: DC

## 2012-04-01 ENCOUNTER — Other Ambulatory Visit: Payer: Self-pay

## 2012-04-01 ENCOUNTER — Ambulatory Visit (HOSPITAL_COMMUNITY)
Admission: RE | Admit: 2012-04-01 | Discharge: 2012-04-01 | Disposition: A | Discharge status: Home / Self Care | Payer: Medicare Other | Source: Ambulatory Visit | Attending: General Surgery | Admitting: General Surgery

## 2012-04-01 ENCOUNTER — Ambulatory Visit (HOSPITAL_COMMUNITY): Payer: Medicare Other | Admitting: Anesthesiology

## 2012-04-01 ENCOUNTER — Ambulatory Visit (HOSPITAL_COMMUNITY): Payer: Medicare Other

## 2012-04-01 ENCOUNTER — Encounter (HOSPITAL_COMMUNITY): Payer: Self-pay | Admitting: Anesthesiology

## 2012-04-01 ENCOUNTER — Encounter (HOSPITAL_COMMUNITY): Payer: Self-pay | Admitting: *Deleted

## 2012-04-01 ENCOUNTER — Encounter (HOSPITAL_COMMUNITY)
Admission: RE | Disposition: A | Discharge status: Home / Self Care | Payer: Self-pay | Source: Ambulatory Visit | Attending: General Surgery

## 2012-04-01 DIAGNOSIS — Z01810 Encounter for preprocedural cardiovascular examination: Secondary | ICD-10-CM | POA: Insufficient documentation

## 2012-04-01 DIAGNOSIS — J449 Chronic obstructive pulmonary disease, unspecified: Secondary | ICD-10-CM | POA: Insufficient documentation

## 2012-04-01 DIAGNOSIS — C50911 Malignant neoplasm of unspecified site of right female breast: Secondary | ICD-10-CM

## 2012-04-01 DIAGNOSIS — Z01812 Encounter for preprocedural laboratory examination: Secondary | ICD-10-CM | POA: Insufficient documentation

## 2012-04-01 DIAGNOSIS — R1013 Epigastric pain: Secondary | ICD-10-CM

## 2012-04-01 DIAGNOSIS — K760 Fatty (change of) liver, not elsewhere classified: Secondary | ICD-10-CM

## 2012-04-01 DIAGNOSIS — E785 Hyperlipidemia, unspecified: Secondary | ICD-10-CM

## 2012-04-01 DIAGNOSIS — F17200 Nicotine dependence, unspecified, uncomplicated: Secondary | ICD-10-CM | POA: Insufficient documentation

## 2012-04-01 DIAGNOSIS — C50919 Malignant neoplasm of unspecified site of unspecified female breast: Secondary | ICD-10-CM | POA: Insufficient documentation

## 2012-04-01 DIAGNOSIS — F209 Schizophrenia, unspecified: Secondary | ICD-10-CM

## 2012-04-01 DIAGNOSIS — J4489 Other specified chronic obstructive pulmonary disease: Secondary | ICD-10-CM | POA: Insufficient documentation

## 2012-04-01 DIAGNOSIS — K219 Gastro-esophageal reflux disease without esophagitis: Secondary | ICD-10-CM

## 2012-04-01 HISTORY — PX: PORTACATH PLACEMENT: SHX2246

## 2012-04-01 LAB — BASIC METABOLIC PANEL
BUN: 17 mg/dL (ref 6–23)
Calcium: 9.8 mg/dL (ref 8.4–10.5)
Creatinine, Ser: 1 mg/dL (ref 0.50–1.10)
GFR calc Af Amer: 70 mL/min — ABNORMAL LOW (ref >90)
GFR calc non Af Amer: 60 mL/min — ABNORMAL LOW (ref >90)

## 2012-04-01 LAB — CBC
HCT: 39.6 % (ref 36.0–46.0)
MCH: 31.7 pg (ref 26.0–34.0)
MCHC: 33.8 g/dL (ref 30.0–36.0)
MCV: 93.6 fL (ref 78.0–100.0)
Platelets: 334 10*3/uL (ref 150–400)
RDW: 13.5 % (ref 11.5–15.5)

## 2012-04-01 SURGERY — INSERTION, TUNNELED CENTRAL VENOUS DEVICE, WITH PORT
Anesthesia: Monitor Anesthesia Care | Site: Chest | Laterality: Left | Wound class: Clean

## 2012-04-01 MED ORDER — FENTANYL CITRATE 0.05 MG/ML IJ SOLN
INTRAMUSCULAR | Status: AC
  Filled 2012-04-01: qty 2

## 2012-04-01 MED ORDER — BACITRACIN ZINC 500 UNIT/GM EX OINT
TOPICAL_OINTMENT | CUTANEOUS | Status: AC
  Filled 2012-04-01: qty 1.8

## 2012-04-01 MED ORDER — LACTATED RINGERS IV SOLN
INTRAVENOUS | Status: DC
  Administered 2012-04-01: 07:00:00 via INTRAVENOUS

## 2012-04-01 MED ORDER — CEFAZOLIN SODIUM-DEXTROSE 2-3 GM-% IV SOLR
2.0000 g | Freq: Once | INTRAVENOUS | Status: AC
  Administered 2012-04-01: 2 g via INTRAVENOUS

## 2012-04-01 MED ORDER — MIDAZOLAM HCL 2 MG/2ML IJ SOLN
INTRAMUSCULAR | Status: AC
  Filled 2012-04-01: qty 2

## 2012-04-01 MED ORDER — FENTANYL CITRATE 0.05 MG/ML IJ SOLN
INTRAMUSCULAR | Status: DC | PRN
  Administered 2012-04-01 (×4): 25 ug via INTRAVENOUS

## 2012-04-01 MED ORDER — MIDAZOLAM HCL 2 MG/2ML IJ SOLN
1.0000 mg | INTRAMUSCULAR | Status: DC | PRN
  Administered 2012-04-01: 2 mg via INTRAVENOUS

## 2012-04-01 MED ORDER — HEPARIN SODIUM (PORCINE) 1000 UNIT/ML IJ SOLN
INTRAMUSCULAR | Status: DC | PRN
  Administered 2012-04-01: 1000 [IU] via INTRAVENOUS

## 2012-04-01 MED ORDER — DEXAMETHASONE SODIUM PHOSPHATE 4 MG/ML IJ SOLN
INTRAMUSCULAR | Status: AC
  Filled 2012-04-01: qty 1

## 2012-04-01 MED ORDER — ONDANSETRON HCL 4 MG/2ML IJ SOLN
4.0000 mg | Freq: Once | INTRAMUSCULAR | Status: AC
  Administered 2012-04-01: 4 mg via INTRAVENOUS

## 2012-04-01 MED ORDER — FENTANYL CITRATE 0.05 MG/ML IJ SOLN: 25.0000 ug | INTRAMUSCULAR | Status: DC | PRN

## 2012-04-01 MED ORDER — PROPOFOL INFUSION 10 MG/ML OPTIME
INTRAVENOUS | Status: DC | PRN
  Administered 2012-04-01: 100 ug/kg/min via INTRAVENOUS

## 2012-04-01 MED ORDER — DEXAMETHASONE SODIUM PHOSPHATE 4 MG/ML IJ SOLN
4.0000 mg | Freq: Once | INTRAMUSCULAR | Status: AC
  Administered 2012-04-01: 4 mg via INTRAVENOUS

## 2012-04-01 MED ORDER — CEFAZOLIN SODIUM-DEXTROSE 2-3 GM-% IV SOLR
INTRAVENOUS | Status: AC
  Filled 2012-04-01: qty 50

## 2012-04-01 MED ORDER — BACITRACIN-NEOMYCIN-POLYMYXIN 400-5-5000 EX OINT
TOPICAL_OINTMENT | CUTANEOUS | Status: DC | PRN
  Administered 2012-04-01: 1 via TOPICAL

## 2012-04-01 MED ORDER — HEPARIN SOD (PORK) LOCK FLUSH 100 UNIT/ML IV SOLN
INTRAVENOUS | Status: AC
  Filled 2012-04-01: qty 5

## 2012-04-01 MED ORDER — LIDOCAINE HCL (PF) 1 % IJ SOLN
INTRAMUSCULAR | Status: AC
  Filled 2012-04-01: qty 30

## 2012-04-01 MED ORDER — LIDOCAINE HCL (PF) 1 % IJ SOLN
INTRAMUSCULAR | Status: DC | PRN
  Administered 2012-04-01: 7 mL

## 2012-04-01 MED ORDER — SODIUM CHLORIDE 0.9 % IV SOLN
INTRAVENOUS | Status: DC | PRN
  Administered 2012-04-01: 3 mL via INTRAMUSCULAR

## 2012-04-01 MED ORDER — ONDANSETRON HCL 4 MG/2ML IJ SOLN
INTRAMUSCULAR | Status: AC
  Filled 2012-04-01: qty 2

## 2012-04-01 MED ORDER — HEPARIN SODIUM (PORCINE) 1000 UNIT/ML IJ SOLN
INTRAMUSCULAR | Status: AC
  Filled 2012-04-01: qty 1

## 2012-04-01 MED ORDER — MUPIROCIN 2 % EX OINT
TOPICAL_OINTMENT | CUTANEOUS | Status: AC
  Filled 2012-04-01: qty 22

## 2012-04-01 MED ORDER — ONDANSETRON HCL 4 MG/2ML IJ SOLN: 4.0000 mg | Freq: Once | INTRAMUSCULAR | Status: DC | PRN

## 2012-04-01 MED ORDER — HEPARIN SOD (PORK) LOCK FLUSH 100 UNIT/ML IV SOLN
INTRAVENOUS | Status: DC | PRN
  Administered 2012-04-01: 5 [IU] via INTRAVENOUS

## 2012-04-01 SURGICAL SUPPLY — 49 items
APPLIER CLIP 9.375 SM OPEN (CLIP)
APR CLP SM 9.3 20 MLT OPN (CLIP)
BAG DECANTER FOR FLEXI CONT (MISCELLANEOUS) ×2 IMPLANT
BAG HAMPER (MISCELLANEOUS) ×2 IMPLANT
CLIP APPLIE 9.375 SM OPEN (CLIP) IMPLANT
CLOTH BEACON ORANGE TIMEOUT ST (SAFETY) ×2 IMPLANT
COVER LIGHT HANDLE STERIS (MISCELLANEOUS) ×4 IMPLANT
DECANTER SPIKE VIAL GLASS SM (MISCELLANEOUS) ×2 IMPLANT
DRAPE C-ARM FOLDED MOBILE STRL (DRAPES) ×2 IMPLANT
DRSG TEGADERM 2-3/8X2-3/4 SM (GAUZE/BANDAGES/DRESSINGS) ×2 IMPLANT
ELECT REM PT RETURN 9FT ADLT (ELECTROSURGICAL) ×2
ELECTRODE REM PT RTRN 9FT ADLT (ELECTROSURGICAL) ×1 IMPLANT
GLOVE BIOGEL PI IND STRL 7.0 (GLOVE) IMPLANT
GLOVE BIOGEL PI IND STRL 7.5 (GLOVE) IMPLANT
GLOVE BIOGEL PI INDICATOR 7.0 (GLOVE) ×1
GLOVE BIOGEL PI INDICATOR 7.5 (GLOVE) ×1
GLOVE ECLIPSE 7.0 STRL STRAW (GLOVE) ×1 IMPLANT
GLOVE EXAM NITRILE MD LF STRL (GLOVE) ×1 IMPLANT
GLOVE SKINSENSE NS SZ7.0 (GLOVE) ×1
GLOVE SKINSENSE STRL SZ7.0 (GLOVE) ×1 IMPLANT
GLOVE SS BIOGEL STRL SZ 6.5 (GLOVE) IMPLANT
GLOVE SUPERSENSE BIOGEL SZ 6.5 (GLOVE) ×1
GOWN STRL REIN XL XLG (GOWN DISPOSABLE) ×5 IMPLANT
IV NS 500ML (IV SOLUTION) ×2
IV NS 500ML BAXH (IV SOLUTION) ×1 IMPLANT
KIT PORT POWER 8FR ISP MRI (CATHETERS) ×2 IMPLANT
KIT ROOM TURNOVER APOR (KITS) ×2 IMPLANT
MANIFOLD NEPTUNE II (INSTRUMENTS) ×2 IMPLANT
NDL HYPO 18GX1.5 BLUNT FILL (NEEDLE) ×1 IMPLANT
NDL HYPO 25X1 1.5 SAFETY (NEEDLE) ×1 IMPLANT
NEEDLE HYPO 18GX1.5 BLUNT FILL (NEEDLE) ×2 IMPLANT
NEEDLE HYPO 25X1 1.5 SAFETY (NEEDLE) ×2 IMPLANT
PACK MINOR (CUSTOM PROCEDURE TRAY) ×2 IMPLANT
PAD ARMBOARD 7.5X6 YLW CONV (MISCELLANEOUS) ×2 IMPLANT
SET BASIN LINEN APH (SET/KITS/TRAYS/PACK) ×2 IMPLANT
SHEATH COOK PEEL AWAY SET 8F (SHEATH) IMPLANT
SOL PREP PROV IODINE SCRUB 4OZ (MISCELLANEOUS) ×2 IMPLANT
SPONGE GAUZE 2X2 8PLY STRL LF (GAUZE/BANDAGES/DRESSINGS) ×2 IMPLANT
STRIP CLOSURE SKIN 1/4X3 (GAUZE/BANDAGES/DRESSINGS) ×2 IMPLANT
SUT VIC AB 4-0 SH 27 (SUTURE) ×2
SUT VIC AB 4-0 SH 27XBRD (SUTURE) ×1 IMPLANT
SUT VIC AB 5-0 P-3 18X BRD (SUTURE) ×1 IMPLANT
SUT VIC AB 5-0 P3 18 (SUTURE) ×2
SYR 20CC LL (SYRINGE) ×2 IMPLANT
SYR 5ML LL (SYRINGE) ×4 IMPLANT
SYR BULB IRRIGATION 50ML (SYRINGE) ×2 IMPLANT
SYR CONTROL 10ML LL (SYRINGE) ×2 IMPLANT
TOWEL OR 17X26 4PK STRL BLUE (TOWEL DISPOSABLE) ×1 IMPLANT
WATER STERILE IRR 1000ML POUR (IV SOLUTION) ×4 IMPLANT

## 2012-04-01 NOTE — Anesthesia Preprocedure Evaluation (Signed)
Anesthesia Evaluation  Patient identified by MRN, date of birth, ID band Patient awake    Reviewed: Allergy & Precautions, H&P , NPO status , Patient's Chart, lab work & pertinent test results  Airway Mallampati: III TM Distance: <3 FB     Dental  (+) Edentulous Upper and Edentulous Lower   Pulmonary COPDCurrent Smoker,  breath sounds clear to auscultation        Cardiovascular Rhythm:Regular Rate:Normal  Hx palpitations & CP, inactive now.   Neuro/Psych PSYCHIATRIC DISORDERS Bipolar Disorder Schizophrenia CVA, Residual Symptoms    GI/Hepatic GERD-  Medicated,  Endo/Other    Renal/GU      Musculoskeletal   Abdominal   Peds  Hematology   Anesthesia Other Findings   Reproductive/Obstetrics                           Anesthesia Physical Anesthesia Plan  ASA: III  Anesthesia Plan: MAC   Post-op Pain Management:    Induction: Intravenous  Airway Management Planned: Nasal Cannula  Additional Equipment:   Intra-op Plan:   Post-operative Plan:   Informed Consent: I have reviewed the patients History and Physical, chart, labs and discussed the procedure including the risks, benefits and alternatives for the proposed anesthesia with the patient or authorized representative who has indicated his/her understanding and acceptance.     Plan Discussed with:   Anesthesia Plan Comments:         Anesthesia Quick Evaluation

## 2012-04-01 NOTE — Progress Notes (Signed)
51 yr. old B. Female with Bx proven CA of the right breast for placement of porta cath.  Procedure and risks addressed with family and consent obtained.  Labs reviewed.  No clinical change in H&P, dict. # [id number].  Filed Vitals:   04/01/12 0715  BP: 142/74  Pulse:   Temp:   Resp: 9  pulse 103 Temp.98.5

## 2012-04-01 NOTE — Progress Notes (Signed)
Post OP Check Pt awake and alert.  Wound clean and dry.  CXR shows tip of catheter in SVC and no pneumothorax. Filed Vitals:   04/01/12 0930  BP: 123/77  Pulse: 102  Temp:   Resp: 16  temp. 97.7 O2 sat 88% ( no change from pre op) Discharge and follow up arranged.

## 2012-04-01 NOTE — Anesthesia Postprocedure Evaluation (Signed)
Anesthesia Post Note  Patient: [name]  Procedure(s) Performed: Procedure(s) (LRB): INSERTION PORT-A-CATH (Left)  Anesthesia type: MAC  Patient location: PACU  Post pain: Pain level controlled  Post assessment: Post-op Vital signs reviewed, Patient's Cardiovascular Status Stable, Respiratory Function Stable, Patent Airway, No signs of Nausea or vomiting and Pain level controlled  Last Vitals:  Filed Vitals:   04/01/12 0916  BP: 127/78  Pulse: 104  Temp: 36.5 C  Resp: 14    Post vital signs: Reviewed and stable  Level of consciousness: awake and alert   Complications: No apparent anesthesia complications

## 2012-04-01 NOTE — Consult Note (Signed)
NAME:  [name], JAGER NO.:  [id number]  MEDICAL RECORD NO.:  [id number]  LOCATION:  [facility]                         FACILITY:  [facility]  PHYSICIAN:  Barbaraann Barthel, M.D. DATE OF BIRTH:  [birth year]  DATE OF CONSULTATION: DATE OF DISCHARGE:                                CONSULTATION   DIAGNOSIS:  Carcinoma of the right breast.  NOTE:  This is a 61 year old black female who has been recently diagnosed with carcinoma of the right breast by biopsy.  In November after colonoscopy, she was noted to have a mass in her right breast. Previous to that she states that she had a mammogram that was normal. This was at [facility] according to what she said.  At any rate, this mass was worked up with a biopsy and carcinoma of the breast was found.  She consulted a Careers adviser in [place], Dr. Dorothey Baseman and plans are made for interim surgery after chemotherapy.  She was referred to me for placement of Port-A-Cath by Dr. Mariel Sleet.  PAST MEDICAL HISTORY:  Positive for bipolar disorder, schizophrenia. She has had some history of chronic obstructive pulmonary disease for which she sees Dr. Juanetta Gosling as a regular doctor and history of hyperlipidemia and fatty infiltration of the liver.  PAST SURGICAL HISTORY:  A tubal ligation, a biopsy of the right breast by Dr. Lovell Sheehan many years ago for a benign lesion and recently in November a biopsy of her axilla and her right breast.  MEDICATIONS:  See medication list.  ALLERGIES:  She has no known allergies.  She has some nausea with ibuprofen but no real allergic symptoms are reported.  SOCIAL HISTORY:  She said she smokes approximately 5 cigarettes per day. She does not use alcohol.  PHYSICAL EXAMINATION:  VITAL SIGNS:  She is 5 feet 5 inches, weighs 147 pounds.  Her temperature is 97.9, pulse was 100 per minute, respirations 20, blood pressure 110/90. HEENT:  Head is normocephalic.  Eyes:  Extraocular movements are  intact. Pupils are round and react to light and accommodation.  There is noted some bilateral conjunctival pallor.  Nose and oral mucosa are moist. The patient has dental prosthesis superiorly and inferiorly. NECK:  There is no cervical adenopathy appreciated.  No bruits are appreciated either.  No thyromegaly. CHEST:  Heart rate is regular. LUNGS:  Fairly clear. BREASTS:  There is a well-healed periauricular incision on the superior aspect of her right nipple which is well healed.  On her right breast, there is a solid mass that is 5-8 cm in diameter.  It is erythematous and there is peau d'orange noted. ABDOMEN:  Globus.  There is no visceromegaly.  No hernias are appreciated. RECTAL AND PELVIC:  Deferred. EXTREMITIES:  Grossly within normal limits.  REVIEW OF SYSTEMS:  NEURO SYSTEM:  No history of migraines or seizures. She has a history of bipolar disease with schizophrenia and there is question of mental retardation noted in her chart.  ENDOCRINE HISTORY: No history of diabetes or thyroid disease, cardiopulmonary disease. History of COPD and history of tobacco use.  No history of alcohol abuse.  MUSCULOSKELETAL SYSTEM:  Grossly within normal limits.  The patient does have poor posture with kyphosis  noted.  GI SYSTEM: No history of hepatitis, constipation, diarrhea, bright red rectal bleeding, melena, or black tarry stools.  No unexplained weight loss. She had a colonoscopy in 2013 in November. GU SYSTEM: No history of frequency, dysuria, or nephrolithiasis.  OB/GYN HISTORY:  She is a gravida 1, para 1, cesarean 0, abortus 0 female, who is menopausal.  Her last mammogram she states was within March 05, 2012 and she had a tubal ligation in the past as mentioned and a previous breast biopsy by Dr. Lovell Sheehan many years ago.  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  She has had a biopsy and we referred this  patient for placement of Port-A-Cath as soon as possible. We discussed placing this tomorrow.  We discussed complications not limited to, but including bleeding, infection, pneumothorax, catheter embolization, and thrombosis.  Informed consent was obtained.  I had the patient sign the consent but I also had her sister sign the consent as well due to her psychiatric history.  We will plan for surgery tomorrow and arrangements are being made.     Barbaraann Barthel, M.D.     WB/MEDQ  D:  03/31/2012  T:  04/01/2012  Job:  [id number]  cc:   Ladona Horns. Mariel Sleet, MD Fax: [phone]  Oneal Deputy. Juanetta Gosling, M.D. Fax: [phone]

## 2012-04-01 NOTE — Brief Op Note (Signed)
04/01/2012  9:20 AM  PATIENT:  [name]  61 y.o. female  PRE-OPERATIVE DIAGNOSIS:  invasive ductal carcinoma right breast  POST-OPERATIVE DIAGNOSIS:  invasive ductal carcinoma right breast  PROCEDURE:  Procedure(s) (LRB) with comments: INSERTION PORT-A-CATH (Left) - Insertion Port-A-Cath Left Subclavian under Fluoroscopy  SURGEON:  Surgeon(s) and Role:    * Marlane Hatcher, MD - Primary  PHYSICIAN ASSISTANT:   ASSISTANTS: none   ANESTHESIA:   IV sedation  EBL:  Total I/O In: 700 [I.V.:700] Out: 25 [Blood:25]  BLOOD ADMINISTERED:none  DRAINS: none   LOCAL MEDICATIONS USED:  XYLOCAINE 1 % without epi ~5 cc.  SPECIMEN:  No Specimen  DISPOSITION OF SPECIMEN:  N/A  COUNTS:  YES  TOURNIQUET:  * No tourniquets in log *  DICTATION: .Other Dictation: Dictation Number OR dict.# [id number].  PLAN OF CARE: Discharge to home after PACU  PATIENT DISPOSITION:  PACU - hemodynamically stable.   Delay start of Pharmacological VTE agent (>24hrs) due to surgical blood loss or risk of bleeding: not applicable

## 2012-04-01 NOTE — Patient Instructions (Addendum)
[facility]   CHEMOTHERAPY INSTRUCTIONS  Adriamycin - bone marrow suppression, nausea, vomiting, hair loss, mouth sores, cardiotoxicity (this is why we do the 2D Echoes), sensitivity to light, will turn urine pink/red for approximately 1 day after receiving it.  *You will take Adriamycin and Cytoxan every 2 weeks for a total of 4 treatments.*   Cytoxan is a chemotherapy drug. Cytoxan can cause nausea/vomiting and hair loss. It can also cause hemorrhagic cystitis (bloody urine) because the chemo irritates your bladder. You need to push fluids when on this chemo, preferably water(64 oz a day). Do not hold your urine. Urinate before you go to bed and if you wake up during the night--- go to the bathroom.  *You will take Adriamycin and Cytoxan every 2 weeks for a total of 4 treatments.*    Taxol - the first time you receive this drug we will titrate it very slowly to ensure that you do not have or are not having an allergic reaction to the chemo. You will also be responsible for taking a steroid called Dexamethasone before and after chemo. This is to reduce your risk of having an allergic reaction to the Taxol. Side Effects: hair loss, lowers your white blood cells (fight infection), muscle aches, nausea/vomiting, irritation to the mouth (mouth sores, pain in your mouth) *neuropathy - numbness/tingling/burning in hands/fingers/feet/toes. We need to know as soon as this begins to happen so that we can monitor it and treat if necessary. The numbness generally begins in the fingertips or tips of the toes and then begins to travel up the finger/toe/hand/foot. We never want you getting to where you can't pick up a pen, coin, zip a zipper, button a button, or have trouble walking. You must tell us if you are experiencing peripheral neuropathy!  *You will take Taxol after you have completed Adriamycin and Cytoxan. Taxol will be given every week for a total of 12 treatments. Taxol  will begin in February. *  Neulasta - this is not chemo but being given to you because you have had chemo. This medication works by stimulating your bone marrow which is inside of your bones to produce more white blood cells. White blood cells protect [place] from infection. Chemo destroys your cancer cells and white blood cells and that is why you need the Neulasta. This medication will be given to you by a nurse in the Cancer Center. It is given 20-24 hours from the completion of chemo. It comes in the form of an injection. It will be administered into the fatty tissue of your abdomen. The most common side effect associated with this injection is bone pain. The bone pain can occur as soon as a few hours after the injection to several days after the injection. Some people have mild bone pain and some have none. Few people experience severe bone pain. We want and need to know how you respond to this injection. If you develop moderate to severe bone pain - we need to know! You may take Tylenol/acetaminophen for the bone pain and use a heating pad to the affected area(s).    POTENTIAL SIDE EFFECTS OF TREATMENT: Increased Susceptibility to Infection, Vomiting, Constipation, Red or Pink Urine (with Adriamycin), Hair Thinning, Changes in Character of Skin and Nails (brittleness, dryness,etc.), Bone Marrow Suppression, Abdominal Cramping, Blood in Urine, Complete Hair Loss, Nausea, Diarrhea, Sun Sensitivity and Mouth Sores   SELF IMAGE NEEDS AND REFERRALS MADE: Obtain hair accessories as soon as possible (  wigs, scarves, turbans,caps,etc.) and Referral to Look Good, Feel Better consultant   EDUCATIONAL MATERIALS GIVEN AND REVIEWED: Chemotherapy and You Specific Instruction Sheets: Adriamycin, Cytoxan, Taxol, Neulasta, Dexamethasone, Zofran, Ativan, Compazine, Aloxi, Emend, Pepcid, Benadryl  SELF CARE ACTIVITIES WHILE ON CHEMOTHERAPY: Increase your fluid intake 48 hours prior to treatment and drink at least 2  quarts per day after treatment., No alcohol intake., No aspirin or other medications unless approved by your oncologist., Eat foods that are light and easy to digest., Eat foods at cold or room temperature., No fried, fatty, or spicy foods immediately before or after treatment., Have teeth cleaned professionally before starting treatment. Keep dentures and partial plates clean., Use soft toothbrush and do not use mouthwashes that contain alcohol. Biotene is a good mouthwash that is available at most pharmacies or may be ordered by calling [phone]., Use warm salt water gargles (1 teaspoon salt per 1 quart warm water) before and after meals and at bedtime. Or you may rinse with 2 tablespoons of three -percent hydrogen peroxide mixed in eight ounces of water., Always use sunscreen with SPF (Sun Protection Factor) of 30 or higher., Use your nausea medication as directed to prevent nausea., Use your stool softener or laxative as directed to prevent constipation. and Use your anti-diarrheal medication as directed to stop diarrhea.  Please wash your hands for at least 30 seconds using warm soapy water. Handwashing is the #1 way to prevent the spread of germs. Stay away from sick people or people who are getting over a cold. If you develop respiratory systems such as green/yellow mucus production or productive cough or persistent cough let us know and we will see if you need an antibiotic. It is a good idea to keep a pair of gloves on when going into grocery stores/[employer] to decrease your risk of coming into contact with germs on the carts, etc. Carry alcohol hand gel with you at all times and use it frequently if out in public. All foods need to be cooked thoroughly. No raw foods. No medium or undercooked meats, eggs. If your food is cooked medium well, it does not need to be hot pink or saturated with bloody liquid at all. Vegetables and fruits need to be washed/rinsed under the faucet with a dish detergent  before being consumed. You can eat raw fruits and vegetables unless we tell you otherwise but it would be best if you cooked them or bought frozen. Do not eat off of salad bars or hot bars unless you really trust the cleanliness of the restaurant. If you need dental work, please let Dr. Mariel Sleet know before you go for your appointment so that we can coordinate the best possible time for you in regards to your chemo regimen. You need to also let your dentist know that you are actively taking chemo. We may need to do labs prior to your dental appointment. We also want your bowels moving at least every other day. If this is not happening, we need to know so that we can get you on a bowel regimen to help you go.      MEDICATIONS: You have been given prescriptions for the following medications:  Dexamethasone 4mg  tablet. Starting the day after Adriamycin and Cytoxan chemo, take 2 tablets in the am and 2 tablets in the pm for 2 days. Repeat after each chemo cycle.  Dexamethasone 4mg  tablet. When taking Taxol chemotherapy...Marland KitchenMarland KitchenAt 9pm the night before chemo, take 2 tablets. Then take 2 tablets @ 3am  the morning of chemo. Then starting the day after Taxol chemo, take 2 tablets in the am for 2 days. Repeat this after each chemo cycle. (you will not start this until you have completed your Adriamycin & Cytoxan chemo)  Zofran 8mg  tablet. Starting the 3rd day after Adriamycin and Cytoxan chemo, take 1 tablet two times a day IF needed for nausea and vomiting. Side effect: constipation  Zofran 8mg  tablet. Starting the day after Taxol chemo, take 1 tablet in the am and 1 tablet in the pm for 2 days. Then take two times a day IF needed for nausea/vomiting. (You will not start this until you have completed your Adriamycin & Cytoxan chemo).  Side effect: constipation  Ativan 1mg  tablet. Take 1 tablet every 4 hours IF needed for nausea or vomiting. This medication can make you drowsy/sleepy.    Compazine 25mg   suppository. Insert 1 rectally every 6 hours IF needed for nausea or vomiting.  EMLA cream. Apply a quarter sized amount to port site 1 hour prior to chemo. Do not rub in. Cover with plastic.   OVER-the-COUNTER  Colace - this is a stool softener. Take 100mg  capsule 2-6 times a day as needed. If you have to take more than 6 capsules of Colace a day call the Cancer Center.  Senna - this is a mild laxative used to treat mild constipation. May take 2 tabs by mouth daily or up to twice a day as needed for mild constipation.  Milk of Magnesia - this is a laxative used to treat moderate to severe constipation. May take 2-4 tablespoons every 8 hours as needed. May increase to 8 tablespoons x 1 dose and if no bowel movement call the Cancer Center.  Imodium - this is for diarrhea. Take 2 tabs after 1st loose stool and then 1 tab after each loose stool until you go a total of 12 hours without a loose stool. Call Cancer Center if loose stools continue.    SYMPTOMS TO REPORT AS SOON AS POSSIBLE AFTER TREATMENT:  FEVER GREATER THAN 100.5 F  CHILLS WITH OR WITHOUT FEVER  NAUSEA AND VOMITING THAT IS NOT CONTROLLED WITH YOUR NAUSEA MEDICATION  UNUSUAL SHORTNESS OF BREATH  UNUSUAL BRUISING OR BLEEDING  TENDERNESS IN MOUTH AND THROAT WITH OR WITHOUT PRESENCE OF ULCERS  URINARY PROBLEMS  BOWEL PROBLEMS  UNUSUAL RASH    Wear comfortable clothing and clothing appropriate for easy access to any Portacath or PICC line. Let us know if there is anything that we can do to make your therapy better!      I have been informed and understand all of the instructions given to me and have received a copy. I have been instructed to call the clinic [phone] or my family physician as soon as possible for continued medical care, if indicated. I do not have any more questions at this time but understand that I may call the Cancer Center or the Patient Navigator at [phone] during office hours should I  have questions or need assistance in obtaining follow-up care.      _________________________________________      _______________     __________ Signature of Patient or Authorized Representative        Date                            Time      _________________________________________ Nurse's Signature

## 2012-04-01 NOTE — Anesthesia Procedure Notes (Signed)
Procedure Name: MAC Date/Time: 04/01/2012 8:13 AM Performed by: Franco Nones Pre-anesthesia Checklist: Patient identified, Emergency Drugs available, Suction available, Timeout performed and Patient being monitored Patient Re-evaluated:Patient Re-evaluated prior to inductionOxygen Delivery Method: Non-rebreather mask

## 2012-04-01 NOTE — Transfer of Care (Signed)
Immediate Anesthesia Transfer of Care Note  Patient: [name]  Procedure(s) Performed: Procedure(s) (LRB): INSERTION PORT-A-CATH (Left)  Patient Location: PACU  Anesthesia Type: MAC  Level of Consciousness: awake  Airway & Oxygen Therapy: Patient Spontanous Breathing. Non rebreather  Post-op Assessment: Report given to PACU RN, Post -op Vital signs reviewed and stable and Patient moving all extremities  Post vital signs: Reviewed and stable  Complications: No apparent anesthesia complications

## 2012-04-01 NOTE — Op Note (Signed)
NAME:  [name], [name] NO.:  [id number]  MEDICAL RECORD NO.:  [id number]  LOCATION:  [facility]                          FACILITY:  [facility]  PHYSICIAN:  Barbaraann Barthel, M.D. DATE OF BIRTH:  1950-10-24  DATE OF PROCEDURE:  04/01/2012 DATE OF DISCHARGE:  04/01/2012                              OPERATIVE REPORT   DIAGNOSIS:  Carcinoma of the right breast.  PROCEDURE:  Placement of left subclavian Port-A-Cath under fluoro.  SPECIMEN:  None.  WOUND CLASSIFICATION:  Clean.  NOTE:  This is a 61 year old black female who had diagnosis of biopsy of carcinoma of the right breast and clinically had what appeared to be inflammatory carcinoma of the breast.  She was seen by the Oncology Department who asked for placement of Port-A-Cath as soon as possible.  This patient has some diagnosis of bipolar disease and schizophrenia. We discussed with her family in detail of the procedure with complications, risks, and informed consent was obtained.  TECHNIQUE:  The patient was placed in the supine position.  After the adequate administration of IV sedation, her left hemithorax was prepped with an alcohol solution and using 1% Xylocaine without epinephrine, an area below her left clavicle was anesthetized.  We then cannulated the left subclavian vein with an 18-gauge needle using the guidewire and Seldinger technique to place a silastic catheter into the superior vena cava under direct fluoroscopic visualization.  We then anesthetized an area below the catheter insertion site and made a subcutaneous pocket and then connected the silastic catheter to the infusion device and then aspirated and then irrigated with heparinized saline solution.  We checked its position.  The fluoroscopy appeared to be in good position. The patient had no changes in either heart rate or rhythm or in her O2 saturation.  We will follow up with a chest x-ray in the recovery room.  There were  no complications.  Estimated blood loss was minimal, probably 5 mL.  The patient received about 650 mL of crystalloids intraoperatively.  No drains were placed and there were no complications.     Barbaraann Barthel, M.D.     WB/MEDQ  D:  04/01/2012  T:  04/01/2012  Job:  [id number]  cc:   Ladona Horns. Mariel Sleet, MD Fax: [phone]  Dr. Dorothey Baseman

## 2012-04-01 NOTE — Progress Notes (Signed)
O2 sat 88% on RA. Dr Malvin Johns at bedside. Aware. No new orders given. Dr Malvin Johns viewed CXR. No pneumo. Porta cath in good position.

## 2012-04-02 ENCOUNTER — Encounter (HOSPITAL_COMMUNITY): Payer: Medicare Other

## 2012-04-02 ENCOUNTER — Ambulatory Visit (HOSPITAL_COMMUNITY): Payer: Medicare Other

## 2012-04-02 ENCOUNTER — Telehealth (HOSPITAL_COMMUNITY): Payer: Self-pay | Admitting: Oncology

## 2012-04-02 DIAGNOSIS — C50911 Malignant neoplasm of unspecified site of right female breast: Secondary | ICD-10-CM

## 2012-04-02 NOTE — Telephone Encounter (Signed)
BLUE MCR [phone] ? IF CPT [id number]*[id number]*9070*9000*2505*2 D ECHO CSR TOYA C NO AUTH IS REQUIRED REF#[id number]/18/13

## 2012-04-02 NOTE — Progress Notes (Signed)
Chemo teaching done and consent signed for Adriamycin/Cytoxan/Taxol. Chemo calendar/med calendar for Adriamycin/Cytoxan given to patient and family.   Meds called into [facility]

## 2012-04-03 ENCOUNTER — Encounter (HOSPITAL_BASED_OUTPATIENT_CLINIC_OR_DEPARTMENT_OTHER): Payer: Medicare Other

## 2012-04-03 ENCOUNTER — Telehealth (HOSPITAL_COMMUNITY): Payer: Self-pay | Admitting: Oncology

## 2012-04-03 VITALS — BP 138/84 | HR 113 | Temp 97.1°F | Resp 20 | Wt 141.4 lb

## 2012-04-03 DIAGNOSIS — C50319 Malignant neoplasm of lower-inner quadrant of unspecified female breast: Secondary | ICD-10-CM

## 2012-04-03 DIAGNOSIS — Z5111 Encounter for antineoplastic chemotherapy: Secondary | ICD-10-CM

## 2012-04-03 DIAGNOSIS — C50911 Malignant neoplasm of unspecified site of right female breast: Secondary | ICD-10-CM

## 2012-04-03 LAB — CBC WITH DIFFERENTIAL/PLATELET
Basophils Absolute: 0 10*3/uL (ref 0.0–0.1)
Eosinophils Absolute: 0 10*3/uL (ref 0.0–0.7)
Eosinophils Relative: 0 % (ref 0–5)
MCH: 32.1 pg (ref 26.0–34.0)
MCV: 93.6 fL (ref 78.0–100.0)
Platelets: 330 10*3/uL (ref 150–400)
RDW: 13.3 % (ref 11.5–15.5)
WBC: 14.7 10*3/uL — ABNORMAL HIGH (ref 4.0–10.5)

## 2012-04-03 LAB — COMPREHENSIVE METABOLIC PANEL
ALT: 24 U/L (ref 0–35)
AST: 24 U/L (ref 0–37)
Calcium: 10 mg/dL (ref 8.4–10.5)
Sodium: 133 mEq/L — ABNORMAL LOW (ref 135–145)
Total Protein: 8.6 g/dL — ABNORMAL HIGH (ref 6.0–8.3)

## 2012-04-03 MED ORDER — SODIUM CHLORIDE 0.9 % IV SOLN
Freq: Once | INTRAVENOUS | Status: AC
  Administered 2012-04-03: 11:00:00 via INTRAVENOUS

## 2012-04-03 MED ORDER — SODIUM CHLORIDE 0.9 % IJ SOLN
10.0000 mL | INTRAMUSCULAR | Status: DC | PRN
  Administered 2012-04-03: 10 mL
  Filled 2012-04-03: qty 10

## 2012-04-03 MED ORDER — HEPARIN SOD (PORK) LOCK FLUSH 100 UNIT/ML IV SOLN
500.0000 [IU] | Freq: Once | INTRAVENOUS | Status: AC | PRN
  Administered 2012-04-03: 500 [IU]
  Filled 2012-04-03: qty 5

## 2012-04-03 MED ORDER — PALONOSETRON HCL INJECTION 0.25 MG/5ML
0.2500 mg | Freq: Once | INTRAVENOUS | Status: AC
  Administered 2012-04-03: 0.25 mg via INTRAVENOUS

## 2012-04-03 MED ORDER — DOXORUBICIN HCL CHEMO IV INJECTION 2 MG/ML
60.0000 mg/m2 | Freq: Once | INTRAVENOUS | Status: AC
  Administered 2012-04-03: 106 mg via INTRAVENOUS
  Filled 2012-04-03: qty 53

## 2012-04-03 MED ORDER — SODIUM CHLORIDE 0.9 % IV SOLN
600.0000 mg/m2 | Freq: Once | INTRAVENOUS | Status: AC
  Administered 2012-04-03: 1060 mg via INTRAVENOUS
  Filled 2012-04-03: qty 53

## 2012-04-03 MED ORDER — SODIUM CHLORIDE 0.9 % IJ SOLN
INTRAMUSCULAR | Status: AC
  Filled 2012-04-03: qty 10

## 2012-04-03 MED ORDER — SODIUM CHLORIDE 0.9 % IV SOLN
Freq: Once | INTRAVENOUS | Status: AC
  Administered 2012-04-03: 12:00:00 via INTRAVENOUS
  Filled 2012-04-03: qty 5

## 2012-04-03 MED ORDER — HEPARIN SOD (PORK) LOCK FLUSH 100 UNIT/ML IV SOLN
INTRAVENOUS | Status: AC
  Filled 2012-04-03: qty 5

## 2012-04-03 MED ORDER — PALONOSETRON HCL INJECTION 0.25 MG/5ML
INTRAVENOUS | Status: AC
  Filled 2012-04-03: qty 5

## 2012-04-03 NOTE — Addendum Note (Signed)
Addended by: Oda Kilts on: 04/03/2012 04:04 PM   Modules accepted: Orders

## 2012-04-03 NOTE — Telephone Encounter (Signed)
[employer] [phone] CALLED TO GET PRIOR AUTH FOR ZOFRAN GAVE CLINICALS TO KIM/RN REVIEWER AND SHE STATE RX WAS AUTH  FROM 04/02/12-04/02/13.

## 2012-04-04 ENCOUNTER — Encounter (HOSPITAL_BASED_OUTPATIENT_CLINIC_OR_DEPARTMENT_OTHER): Payer: Medicare Other

## 2012-04-04 ENCOUNTER — Encounter (HOSPITAL_COMMUNITY): Payer: Self-pay | Admitting: General Surgery

## 2012-04-04 ENCOUNTER — Ambulatory Visit (HOSPITAL_COMMUNITY): Payer: Medicare Other

## 2012-04-04 VITALS — BP 110/69 | HR 112 | Temp 98.0°F | Resp 18

## 2012-04-04 DIAGNOSIS — C50911 Malignant neoplasm of unspecified site of right female breast: Secondary | ICD-10-CM

## 2012-04-04 DIAGNOSIS — C50319 Malignant neoplasm of lower-inner quadrant of unspecified female breast: Secondary | ICD-10-CM

## 2012-04-04 MED ORDER — PEGFILGRASTIM INJECTION 6 MG/0.6ML
SUBCUTANEOUS | Status: AC
  Filled 2012-04-04: qty 0.6

## 2012-04-04 MED ORDER — PEGFILGRASTIM INJECTION 6 MG/0.6ML
6.0000 mg | Freq: Once | SUBCUTANEOUS | Status: AC
  Administered 2012-04-04: 6 mg via SUBCUTANEOUS

## 2012-04-04 NOTE — Progress Notes (Unsigned)
[name] presents today for injection per the provider's orders.  Neulasta administered administration without incident; see MAR for injection details.  Patient tolerated procedure well and without incident.  Suture line at port placement incision cleaned and covered per pt's request.  No questions or complaints noted at this time; pt states she had tolerated her 1st chemo without complications - reported feeling sleepier than usual, but no other complaints from her baseline.

## 2012-04-15 ENCOUNTER — Other Ambulatory Visit (HOSPITAL_COMMUNITY): Payer: Self-pay | Admitting: Oncology

## 2012-04-17 ENCOUNTER — Encounter (HOSPITAL_COMMUNITY): Payer: Medicare Other | Attending: Oncology

## 2012-04-17 VITALS — BP 114/71 | HR 102 | Temp 98.5°F | Resp 18

## 2012-04-17 DIAGNOSIS — C50319 Malignant neoplasm of lower-inner quadrant of unspecified female breast: Secondary | ICD-10-CM

## 2012-04-17 DIAGNOSIS — Z5111 Encounter for antineoplastic chemotherapy: Secondary | ICD-10-CM

## 2012-04-17 DIAGNOSIS — C50911 Malignant neoplasm of unspecified site of right female breast: Secondary | ICD-10-CM

## 2012-04-17 DIAGNOSIS — C50919 Malignant neoplasm of unspecified site of unspecified female breast: Secondary | ICD-10-CM | POA: Insufficient documentation

## 2012-04-17 LAB — CBC WITH DIFFERENTIAL/PLATELET
Basophils Absolute: 0 10*3/uL (ref 0.0–0.1)
HCT: 33.9 % — ABNORMAL LOW (ref 36.0–46.0)
Lymphocytes Relative: 14 % (ref 12–46)
Monocytes Absolute: 1.1 10*3/uL — ABNORMAL HIGH (ref 0.1–1.0)
Neutro Abs: 9.3 10*3/uL — ABNORMAL HIGH (ref 1.7–7.7)
RDW: 12.7 % (ref 11.5–15.5)
WBC: 12.2 10*3/uL — ABNORMAL HIGH (ref 4.0–10.5)

## 2012-04-17 LAB — COMPREHENSIVE METABOLIC PANEL
ALT: 18 U/L (ref 0–35)
AST: 19 U/L (ref 0–37)
Alkaline Phosphatase: 97 U/L (ref 39–117)
CO2: 25 mEq/L (ref 19–32)
Chloride: 97 mEq/L (ref 96–112)
Creatinine, Ser: 0.89 mg/dL (ref 0.50–1.10)
GFR calc non Af Amer: 69 mL/min — ABNORMAL LOW (ref >90)
Potassium: 3.7 mEq/L (ref 3.5–5.1)
Sodium: 133 mEq/L — ABNORMAL LOW (ref 135–145)
Total Bilirubin: 0.1 mg/dL — ABNORMAL LOW (ref 0.3–1.2)

## 2012-04-17 MED ORDER — SODIUM CHLORIDE 0.9 % IV SOLN
600.0000 mg/m2 | Freq: Once | INTRAVENOUS | Status: AC
  Administered 2012-04-17: 1060 mg via INTRAVENOUS
  Filled 2012-04-17: qty 53

## 2012-04-17 MED ORDER — SODIUM CHLORIDE 0.9 % IV SOLN
Freq: Once | INTRAVENOUS | Status: AC
  Administered 2012-04-17: 13:00:00 via INTRAVENOUS
  Filled 2012-04-17: qty 5

## 2012-04-17 MED ORDER — SODIUM CHLORIDE 0.9 % IJ SOLN
INTRAMUSCULAR | Status: AC
  Filled 2012-04-17: qty 10

## 2012-04-17 MED ORDER — DOXORUBICIN HCL CHEMO IV INJECTION 2 MG/ML
60.0000 mg/m2 | Freq: Once | INTRAVENOUS | Status: AC
  Administered 2012-04-17: 106 mg via INTRAVENOUS
  Filled 2012-04-17: qty 53

## 2012-04-17 MED ORDER — PALONOSETRON HCL INJECTION 0.25 MG/5ML
INTRAVENOUS | Status: AC
  Filled 2012-04-17: qty 5

## 2012-04-17 MED ORDER — SODIUM CHLORIDE 0.9 % IJ SOLN
10.0000 mL | INTRAMUSCULAR | Status: DC | PRN
  Administered 2012-04-17: 10 mL
  Filled 2012-04-17: qty 10

## 2012-04-17 MED ORDER — SODIUM CHLORIDE 0.9 % IV SOLN
Freq: Once | INTRAVENOUS | Status: AC
  Administered 2012-04-17: 12:00:00 via INTRAVENOUS

## 2012-04-17 MED ORDER — HEPARIN SOD (PORK) LOCK FLUSH 100 UNIT/ML IV SOLN
INTRAVENOUS | Status: AC
  Filled 2012-04-17: qty 5

## 2012-04-17 MED ORDER — HEPARIN SOD (PORK) LOCK FLUSH 100 UNIT/ML IV SOLN
500.0000 [IU] | Freq: Once | INTRAVENOUS | Status: AC | PRN
  Administered 2012-04-17: 500 [IU]
  Filled 2012-04-17: qty 5

## 2012-04-17 MED ORDER — PALONOSETRON HCL INJECTION 0.25 MG/5ML
0.2500 mg | Freq: Once | INTRAVENOUS | Status: AC
  Administered 2012-04-17: 0.25 mg via INTRAVENOUS

## 2012-04-17 NOTE — Progress Notes (Signed)
Tolerated well

## 2012-04-18 ENCOUNTER — Encounter (HOSPITAL_BASED_OUTPATIENT_CLINIC_OR_DEPARTMENT_OTHER): Payer: Medicare Other

## 2012-04-18 VITALS — BP 109/69 | HR 98

## 2012-04-18 DIAGNOSIS — C50911 Malignant neoplasm of unspecified site of right female breast: Secondary | ICD-10-CM

## 2012-04-18 DIAGNOSIS — C50319 Malignant neoplasm of lower-inner quadrant of unspecified female breast: Secondary | ICD-10-CM

## 2012-04-18 MED ORDER — PEGFILGRASTIM INJECTION 6 MG/0.6ML
SUBCUTANEOUS | Status: AC
  Filled 2012-04-18: qty 0.6

## 2012-04-18 MED ORDER — PEGFILGRASTIM INJECTION 6 MG/0.6ML
6.0000 mg | Freq: Once | SUBCUTANEOUS | Status: AC
  Administered 2012-04-18: 6 mg via SUBCUTANEOUS

## 2012-04-18 NOTE — Progress Notes (Signed)
[name] presents today for injection per MD orders. Neulasta 6mg  administered SQ in right Abdomen. Administration without incident. Patient tolerated well.

## 2012-04-25 ENCOUNTER — Other Ambulatory Visit (HOSPITAL_COMMUNITY): Payer: Self-pay | Admitting: *Deleted

## 2012-04-25 MED ORDER — MAGIC MOUTHWASH: 5.0000 mL | Freq: Four times a day (QID) | ORAL | Status: DC | PRN

## 2012-04-25 NOTE — Progress Notes (Signed)
Received call from [place] with home health [phone]) states patient has c/o sore throat. BP 98/50, HR 120, resp 36. She has no other complaints. We will call in Dukes magic mouthwash to [facility]. The family knows that if she changes at all they should take her to ED. Home health will also be seeing her 04/26/2012

## 2012-05-01 ENCOUNTER — Encounter (HOSPITAL_BASED_OUTPATIENT_CLINIC_OR_DEPARTMENT_OTHER): Payer: Medicare Other

## 2012-05-01 VITALS — Wt 135.6 lb

## 2012-05-01 DIAGNOSIS — C50319 Malignant neoplasm of lower-inner quadrant of unspecified female breast: Secondary | ICD-10-CM

## 2012-05-01 DIAGNOSIS — C50911 Malignant neoplasm of unspecified site of right female breast: Secondary | ICD-10-CM

## 2012-05-01 DIAGNOSIS — Z5111 Encounter for antineoplastic chemotherapy: Secondary | ICD-10-CM

## 2012-05-01 LAB — COMPREHENSIVE METABOLIC PANEL
BUN: 6 mg/dL (ref 6–23)
CO2: 26 mEq/L (ref 19–32)
Chloride: 100 mEq/L (ref 96–112)
Creatinine, Ser: 0.75 mg/dL (ref 0.50–1.10)
GFR calc non Af Amer: >90 mL/min (ref >90)
Total Bilirubin: 0.1 mg/dL — ABNORMAL LOW (ref 0.3–1.2)

## 2012-05-01 LAB — CBC WITH DIFFERENTIAL/PLATELET
Basophils Relative: 0 % (ref 0–1)
Eosinophils Relative: 0 % (ref 0–5)
HCT: 30.7 % — ABNORMAL LOW (ref 36.0–46.0)
Hemoglobin: 10.4 g/dL — ABNORMAL LOW (ref 12.0–15.0)
Lymphocytes Relative: 14 % (ref 12–46)
MCHC: 33.9 g/dL (ref 30.0–36.0)
MCV: 93.9 fL (ref 78.0–100.0)
Monocytes Absolute: 1.1 10*3/uL — ABNORMAL HIGH (ref 0.1–1.0)
Monocytes Relative: 8 % (ref 3–12)
Neutro Abs: 11 10*3/uL — ABNORMAL HIGH (ref 1.7–7.7)

## 2012-05-01 MED ORDER — PALONOSETRON HCL INJECTION 0.25 MG/5ML
0.2500 mg | Freq: Once | INTRAVENOUS | Status: AC
  Administered 2012-05-01: 0.25 mg via INTRAVENOUS

## 2012-05-01 MED ORDER — SODIUM CHLORIDE 0.9 % IV SOLN
Freq: Once | INTRAVENOUS | Status: AC
  Administered 2012-05-01: 12:00:00 via INTRAVENOUS

## 2012-05-01 MED ORDER — DOXORUBICIN HCL CHEMO IV INJECTION 2 MG/ML
60.0000 mg/m2 | Freq: Once | INTRAVENOUS | Status: AC
  Administered 2012-05-01: 106 mg via INTRAVENOUS
  Filled 2012-05-01: qty 53

## 2012-05-01 MED ORDER — HEPARIN SOD (PORK) LOCK FLUSH 100 UNIT/ML IV SOLN
500.0000 [IU] | Freq: Once | INTRAVENOUS | Status: AC | PRN
  Administered 2012-05-01: 500 [IU]
  Filled 2012-05-01: qty 5

## 2012-05-01 MED ORDER — HEPARIN SOD (PORK) LOCK FLUSH 100 UNIT/ML IV SOLN
INTRAVENOUS | Status: AC
  Filled 2012-05-01: qty 5

## 2012-05-01 MED ORDER — SODIUM CHLORIDE 0.9 % IV SOLN
600.0000 mg/m2 | Freq: Once | INTRAVENOUS | Status: AC
  Administered 2012-05-01: 1060 mg via INTRAVENOUS
  Filled 2012-05-01: qty 53

## 2012-05-01 MED ORDER — SODIUM CHLORIDE 0.9 % IV SOLN
Freq: Once | INTRAVENOUS | Status: AC
  Administered 2012-05-01: 13:00:00 via INTRAVENOUS
  Filled 2012-05-01: qty 5

## 2012-05-01 MED ORDER — SODIUM CHLORIDE 0.9 % IJ SOLN
10.0000 mL | INTRAMUSCULAR | Status: DC | PRN
  Filled 2012-05-01: qty 10

## 2012-05-02 ENCOUNTER — Encounter (HOSPITAL_BASED_OUTPATIENT_CLINIC_OR_DEPARTMENT_OTHER): Payer: Medicare Other

## 2012-05-02 VITALS — BP 109/51 | HR 106

## 2012-05-02 DIAGNOSIS — C50911 Malignant neoplasm of unspecified site of right female breast: Secondary | ICD-10-CM

## 2012-05-02 DIAGNOSIS — C50319 Malignant neoplasm of lower-inner quadrant of unspecified female breast: Secondary | ICD-10-CM

## 2012-05-02 LAB — CANCER ANTIGEN 27.29: CA 27.29: 30 U/mL (ref 0–39)

## 2012-05-02 MED ORDER — PEGFILGRASTIM INJECTION 6 MG/0.6ML
6.0000 mg | Freq: Once | SUBCUTANEOUS | Status: AC
  Administered 2012-05-02: 6 mg via SUBCUTANEOUS

## 2012-05-02 MED ORDER — PEGFILGRASTIM INJECTION 6 MG/0.6ML
SUBCUTANEOUS | Status: AC
  Filled 2012-05-02: qty 0.6

## 2012-05-02 NOTE — Progress Notes (Signed)
[name] presents today for injection per MD orders. Neulasta 6mg administered SQ in right Abdomen. Administration without incident. Patient tolerated well.  

## 2012-05-06 ENCOUNTER — Ambulatory Visit (HOSPITAL_COMMUNITY)
Admission: RE | Admit: 2012-05-06 | Discharge: 2012-05-06 | Disposition: A | Discharge status: Home / Self Care | Payer: Medicare Other | Source: Ambulatory Visit | Attending: Oncology | Admitting: Oncology

## 2012-05-06 DIAGNOSIS — C50911 Malignant neoplasm of unspecified site of right female breast: Secondary | ICD-10-CM

## 2012-05-06 DIAGNOSIS — F17200 Nicotine dependence, unspecified, uncomplicated: Secondary | ICD-10-CM | POA: Insufficient documentation

## 2012-05-06 DIAGNOSIS — C50919 Malignant neoplasm of unspecified site of unspecified female breast: Secondary | ICD-10-CM | POA: Insufficient documentation

## 2012-05-06 DIAGNOSIS — Z9221 Personal history of antineoplastic chemotherapy: Secondary | ICD-10-CM | POA: Insufficient documentation

## 2012-05-06 DIAGNOSIS — I369 Nonrheumatic tricuspid valve disorder, unspecified: Secondary | ICD-10-CM

## 2012-05-06 NOTE — Progress Notes (Signed)
*  PRELIMINARY RESULTS* Echocardiogram 2D Echocardiogram has been performed.  Conrad [place] 05/06/2012, 1:38 PM

## 2012-05-09 ENCOUNTER — Other Ambulatory Visit (HOSPITAL_COMMUNITY): Payer: Self-pay | Admitting: Oncology

## 2012-05-15 ENCOUNTER — Encounter (HOSPITAL_BASED_OUTPATIENT_CLINIC_OR_DEPARTMENT_OTHER): Payer: Medicare Other

## 2012-05-15 VITALS — BP 114/65 | HR 116 | Temp 98.2°F | Resp 18 | Wt 135.0 lb

## 2012-05-15 DIAGNOSIS — Z5111 Encounter for antineoplastic chemotherapy: Secondary | ICD-10-CM

## 2012-05-15 DIAGNOSIS — Z171 Estrogen receptor negative status [ER-]: Secondary | ICD-10-CM

## 2012-05-15 DIAGNOSIS — C50911 Malignant neoplasm of unspecified site of right female breast: Secondary | ICD-10-CM

## 2012-05-15 DIAGNOSIS — C50319 Malignant neoplasm of lower-inner quadrant of unspecified female breast: Secondary | ICD-10-CM

## 2012-05-15 LAB — CBC WITH DIFFERENTIAL/PLATELET
Basophils Relative: 0 % (ref 0–1)
Eosinophils Absolute: 0 10*3/uL (ref 0.0–0.7)
Eosinophils Relative: 0 % (ref 0–5)
Hemoglobin: 9.3 g/dL — ABNORMAL LOW (ref 12.0–15.0)
Lymphs Abs: 1 10*3/uL (ref 0.7–4.0)
MCH: 31.7 pg (ref 26.0–34.0)
MCHC: 33.5 g/dL (ref 30.0–36.0)
MCV: 94.9 fL (ref 78.0–100.0)
Monocytes Relative: 10 % (ref 3–12)
Neutrophils Relative %: 81 % — ABNORMAL HIGH (ref 43–77)
RBC: 2.93 MIL/uL — ABNORMAL LOW (ref 3.87–5.11)

## 2012-05-15 LAB — COMPREHENSIVE METABOLIC PANEL
Albumin: 3.3 g/dL — ABNORMAL LOW (ref 3.5–5.2)
Alkaline Phosphatase: 100 U/L (ref 39–117)
BUN: 6 mg/dL (ref 6–23)
Calcium: 9.1 mg/dL (ref 8.4–10.5)
Creatinine, Ser: 0.72 mg/dL (ref 0.50–1.10)
GFR calc Af Amer: >90 mL/min (ref >90)
Glucose, Bld: 195 mg/dL — ABNORMAL HIGH (ref 70–99)
Total Protein: 6.5 g/dL (ref 6.0–8.3)

## 2012-05-15 MED ORDER — DOXORUBICIN HCL CHEMO IV INJECTION 2 MG/ML
60.0000 mg/m2 | Freq: Once | INTRAVENOUS | Status: AC
  Administered 2012-05-15: 106 mg via INTRAVENOUS
  Filled 2012-05-15: qty 53

## 2012-05-15 MED ORDER — SODIUM CHLORIDE 0.9 % IJ SOLN
10.0000 mL | INTRAMUSCULAR | Status: DC | PRN
  Filled 2012-05-15: qty 10

## 2012-05-15 MED ORDER — SODIUM CHLORIDE 0.9 % IV SOLN
Freq: Once | INTRAVENOUS | Status: AC
  Administered 2012-05-15: 12:00:00 via INTRAVENOUS

## 2012-05-15 MED ORDER — PALONOSETRON HCL INJECTION 0.25 MG/5ML
INTRAVENOUS | Status: AC
  Filled 2012-05-15: qty 5

## 2012-05-15 MED ORDER — SODIUM CHLORIDE 0.9 % IV SOLN
Freq: Once | INTRAVENOUS | Status: AC
  Administered 2012-05-15: 12:00:00 via INTRAVENOUS
  Filled 2012-05-15: qty 5

## 2012-05-15 MED ORDER — SODIUM CHLORIDE 0.9 % IV SOLN
600.0000 mg/m2 | Freq: Once | INTRAVENOUS | Status: AC
  Administered 2012-05-15: 1060 mg via INTRAVENOUS
  Filled 2012-05-15: qty 53

## 2012-05-15 MED ORDER — HEPARIN SOD (PORK) LOCK FLUSH 100 UNIT/ML IV SOLN
500.0000 [IU] | Freq: Once | INTRAVENOUS | Status: AC | PRN
  Administered 2012-05-15: 500 [IU]
  Filled 2012-05-15: qty 5

## 2012-05-15 MED ORDER — PALONOSETRON HCL INJECTION 0.25 MG/5ML
0.2500 mg | Freq: Once | INTRAVENOUS | Status: AC
  Administered 2012-05-15: 0.25 mg via INTRAVENOUS

## 2012-05-15 NOTE — Progress Notes (Signed)
Tolerated well

## 2012-05-16 ENCOUNTER — Encounter (HOSPITAL_BASED_OUTPATIENT_CLINIC_OR_DEPARTMENT_OTHER): Payer: Medicare Other | Admitting: Oncology

## 2012-05-16 ENCOUNTER — Encounter (HOSPITAL_COMMUNITY): Payer: Medicare Other

## 2012-05-16 VITALS — BP 99/66 | HR 114 | Temp 98.1°F | Resp 16 | Wt 138.9 lb

## 2012-05-16 DIAGNOSIS — C50911 Malignant neoplasm of unspecified site of right female breast: Secondary | ICD-10-CM

## 2012-05-16 DIAGNOSIS — F489 Nonpsychotic mental disorder, unspecified: Secondary | ICD-10-CM

## 2012-05-16 DIAGNOSIS — C50319 Malignant neoplasm of lower-inner quadrant of unspecified female breast: Secondary | ICD-10-CM

## 2012-05-16 DIAGNOSIS — Z171 Estrogen receptor negative status [ER-]: Secondary | ICD-10-CM

## 2012-05-16 MED ORDER — DEXAMETHASONE 4 MG PO TABS: ORAL_TABLET | ORAL | Status: DC

## 2012-05-16 MED ORDER — PEGFILGRASTIM INJECTION 6 MG/0.6ML
SUBCUTANEOUS | Status: AC
  Filled 2012-05-16: qty 0.6

## 2012-05-16 MED ORDER — ONDANSETRON HCL 8 MG PO TABS: 8.0000 mg | ORAL_TABLET | Freq: Once | ORAL | Status: DC

## 2012-05-16 MED ORDER — PEGFILGRASTIM INJECTION 6 MG/0.6ML
6.0000 mg | Freq: Once | SUBCUTANEOUS | Status: AC
  Administered 2012-05-16: 6 mg via SUBCUTANEOUS

## 2012-05-16 NOTE — Progress Notes (Signed)
[name] presents today for injection per MD orders. Neulasta 6mg  administered SQ in left Abdomen. Administration without incident. Patient tolerated well.

## 2012-05-16 NOTE — Progress Notes (Signed)
In preparation for your chemo in 2 weeks:  Dexamethasone 4 mg tablet. Starting the night before chemo @ 9pm take 2 tablets. Then take 2 tablets @ 3am the morning of chemo. Starting the day after chemo, take 2 tablets in the am and 2 tablets in the pm for 1 day.   Zofran 8mg  tablet. Starting the day after chemo, take 1 tablet in the am and 1 tablet in the pm for 2 days. Then may take 1 tablet two times a day IF needed for nausea/vomiting.   Taxol - the first time you receive this drug we will titrate it very slowly to ensure that you do not have or are not having an allergic reaction to the chemo. You will also be responsible for taking a steroid called Dexamethasone before and after chemo. This is to reduce your risk of having an allergic reaction to the Taxol. Side Effects: hair loss, abdominal pain, lowers your white blood cells (fight infection), muscle aches, nausea/vomiting, irritation to the mouth (mouth sores, pain in your mouth) *neuropathy - numbness/tingling/burning in hands/fingers/feet/toes. We need to know as soon as this begins to happen so that we can monitor it and treat if necessary. The numbness generally begins in the fingertips of tips of toes and then begins to travel up the finger/toe/hand/foot. We never want you getting to where you can't pick up a pen, coin, zip a zipper, button a button, or have trouble walking. You must tell us immediately if you are experiencing peripheral neuropathy!

## 2012-05-16 NOTE — Progress Notes (Signed)
Injection given at MD appt. 

## 2012-05-16 NOTE — Patient Instructions (Addendum)
[facility] Discharge Instructions  RECOMMENDATIONS MADE BY THE CONSULTANT AND ANY TEST RESULTS WILL BE SENT TO YOUR REFERRING PHYSICIAN.  EXAM FINDINGS BY THE PHYSICIAN TODAY AND SIGNS OR SYMPTOMS TO REPORT TO CLINIC OR PRIMARY PHYSICIAN: Exam and discussion by MD. 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  You have had some response to the first part of your treatment with Adriamycin and Cytoxan but not as much as we hoped.  Surgery cannot be done until the tumor shrinks more.  Plan to start weekly chemotherapy in 2 weeks with Taxol. Main side effects are numbness, tingling, allergic type reactions, hair loss, constipation.    MEDICATIONS PRESCRIBED:  none  INSTRUCTIONS GIVEN AND DISCUSSED: Report fevers, chills, shortness of breath,etc.      SPECIAL INSTRUCTIONS/FOLLOW-UP: To start weekly Taxol on 2/18.  Will schedule appointment with Dr. Thersa Salt at [facility] in [place] for radiation consult and will also get you scheduled to see Dr. Malvin Johns in about 8 weeks for possible surgical consult.  Thank you for choosing [facility] to provide your oncology and hematology care.  To afford each patient quality time with our providers, please arrive at least 15 minutes before your scheduled appointment time.  With your help, our goal is to use those 15 minutes to complete the necessary work-up to ensure our physicians have the information they need to help with your evaluation and healthcare recommendations.    Effective January 1st, 2014, we ask that you re-schedule your appointment with our physicians should you arrive 10 or more minutes late for your appointment.  We strive to give you quality time with our providers, and arriving late affects you and other patients whose appointments are after yours.    Again, thank you for choosing [facility].  Our hope is that these requests will decrease the amount of time that you wait before being seen by our physicians.       _____________________________________________________________  Should you have questions after your visit to [facility], please contact our office at [phone] between the hours of 8:30 a.m. and 5:00 p.m.  Voicemails left after 4:30 p.m. will not be returned until the following business day.  For prescription refill requests, have your pharmacy contact our office with your prescription refill request.

## 2012-05-20 NOTE — Progress Notes (Signed)
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 It does extend around the entire nipple areola complex. She is now status post 4 cycles of neoadjuvant Adriamycin and Cytoxan. 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  She has tolerated the chemotherapy very well. She is accompanied by her 4 sisters. They have a host of questions.  Her vital signs however are stable and she looks good. She still has no lymphadenopathy that's palpable in the cervical, subclavicular, infraclavicular, axillary, or inguinal areas. Her lungs are clear but she has been a smoker. Heart shows a regular rhythm and rate without distinct murmur rub or gallop. Abdomen is soft and nontender. The left breast is negative the right breast still shows a mass which is now approximately 5 x 4 cm. There is less erythema that is quite significantly less. There is still induration of the inferior half of the right breast however. The main part of this mass is still at the inferior portion of the right breast at the inframammary fold. It appears still fixed to the underlying chest wall.  We will embark on chemotherapy with weekly Taxol x12 weeks followed by radiation therapy. We will obtain a radiation therapy consult soon.  I did inform the patient and her family that operating we'll probably not be potentially feasible. We will see how she responds to Taxol. We will obtain a surgical consult if appropriate. We will get Dr. Thersa Salt to see her in the near future.  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 Problem #6 long-standing smoking history consistent  with COPD though her sisters are not binder cigarettes any longer. She is living with one of them constantly.

## 2012-05-21 ENCOUNTER — Other Ambulatory Visit (HOSPITAL_COMMUNITY): Payer: Self-pay | Admitting: Oncology

## 2012-05-27 ENCOUNTER — Other Ambulatory Visit (HOSPITAL_COMMUNITY): Payer: Self-pay | Admitting: Oncology

## 2012-05-27 ENCOUNTER — Encounter (HOSPITAL_COMMUNITY): Payer: Medicare Other | Attending: Oncology

## 2012-05-27 DIAGNOSIS — C50219 Malignant neoplasm of upper-inner quadrant of unspecified female breast: Secondary | ICD-10-CM

## 2012-05-27 DIAGNOSIS — C50919 Malignant neoplasm of unspecified site of unspecified female breast: Secondary | ICD-10-CM | POA: Insufficient documentation

## 2012-05-27 DIAGNOSIS — C50911 Malignant neoplasm of unspecified site of right female breast: Secondary | ICD-10-CM

## 2012-05-27 LAB — CBC WITH DIFFERENTIAL/PLATELET
Basophils Absolute: 0 10*3/uL (ref 0.0–0.1)
HCT: 28.5 % — ABNORMAL LOW (ref 36.0–46.0)
Lymphs Abs: 1.3 10*3/uL (ref 0.7–4.0)
MCV: 96.3 fL (ref 78.0–100.0)
Monocytes Relative: 13 % — ABNORMAL HIGH (ref 3–12)
Neutro Abs: 6.6 10*3/uL (ref 1.7–7.7)
RDW: 16.3 % — ABNORMAL HIGH (ref 11.5–15.5)
WBC: 9.1 10*3/uL (ref 4.0–10.5)

## 2012-05-27 LAB — COMPREHENSIVE METABOLIC PANEL
ALT: 12 U/L (ref 0–35)
Alkaline Phosphatase: 128 U/L — ABNORMAL HIGH (ref 39–117)
CO2: 28 mEq/L (ref 19–32)
Chloride: 100 mEq/L (ref 96–112)
GFR calc Af Amer: 90 mL/min — ABNORMAL LOW (ref >90)
GFR calc non Af Amer: 77 mL/min — ABNORMAL LOW (ref >90)
Glucose, Bld: 109 mg/dL — ABNORMAL HIGH (ref 70–99)
Potassium: 4.1 mEq/L (ref 3.5–5.1)
Sodium: 141 mEq/L (ref 135–145)
Total Bilirubin: 0.1 mg/dL — ABNORMAL LOW (ref 0.3–1.2)

## 2012-05-27 NOTE — Progress Notes (Signed)
[name] presented for labwork. Labs per MD order drawn via Peripheral Line 23 gauge needle inserted in left antecubital.  Good blood return present. Procedure without incident.  Needle removed intact. Patient tolerated procedure well.

## 2012-05-28 ENCOUNTER — Encounter (HOSPITAL_BASED_OUTPATIENT_CLINIC_OR_DEPARTMENT_OTHER): Payer: Medicare Other

## 2012-05-28 VITALS — BP 151/98 | HR 107 | Temp 97.3°F | Resp 18 | Wt 133.4 lb

## 2012-05-28 DIAGNOSIS — Z5111 Encounter for antineoplastic chemotherapy: Secondary | ICD-10-CM

## 2012-05-28 DIAGNOSIS — C50911 Malignant neoplasm of unspecified site of right female breast: Secondary | ICD-10-CM

## 2012-05-28 DIAGNOSIS — C50319 Malignant neoplasm of lower-inner quadrant of unspecified female breast: Secondary | ICD-10-CM

## 2012-05-28 MED ORDER — SODIUM CHLORIDE 0.9 % IV SOLN
Freq: Once | INTRAVENOUS | Status: AC
  Administered 2012-05-28: 5 mg via INTRAVENOUS
  Filled 2012-05-28: qty 4

## 2012-05-28 MED ORDER — DIPHENHYDRAMINE HCL 25 MG PO CAPS
ORAL_CAPSULE | ORAL | Status: AC
  Filled 2012-05-28: qty 1

## 2012-05-28 MED ORDER — DIPHENHYDRAMINE HCL 50 MG/ML IJ SOLN
50.0000 mg | Freq: Once | INTRAMUSCULAR | Status: AC
  Administered 2012-05-28: 50 mg via INTRAVENOUS

## 2012-05-28 MED ORDER — DIPHENHYDRAMINE HCL 50 MG/ML IJ SOLN
INTRAMUSCULAR | Status: AC
  Filled 2012-05-28: qty 1

## 2012-05-28 MED ORDER — HEPARIN SOD (PORK) LOCK FLUSH 100 UNIT/ML IV SOLN
500.0000 [IU] | Freq: Once | INTRAVENOUS | Status: AC | PRN
Start: 2012-05-28 — End: 2012-05-28
  Administered 2012-05-28: 500 [IU]
  Filled 2012-05-28: qty 5

## 2012-05-28 MED ORDER — FAMOTIDINE IN NACL 20-0.9 MG/50ML-% IV SOLN
20.0000 mg | Freq: Once | INTRAVENOUS | Status: AC
  Administered 2012-05-28: 20 mg via INTRAVENOUS

## 2012-05-28 MED ORDER — FAMOTIDINE IN NACL 20-0.9 MG/50ML-% IV SOLN
INTRAVENOUS | Status: AC
  Filled 2012-05-28: qty 50

## 2012-05-28 MED ORDER — SODIUM CHLORIDE 0.9 % IJ SOLN
10.0000 mL | INTRAMUSCULAR | Status: DC | PRN
  Administered 2012-05-28: 10 mL
  Filled 2012-05-28: qty 10

## 2012-05-28 MED ORDER — PACLITAXEL CHEMO INJECTION 300 MG/50ML
80.0000 mg/m2 | Freq: Once | INTRAVENOUS | Status: AC
  Administered 2012-05-28: 138 mg via INTRAVENOUS
  Filled 2012-05-28: qty 23

## 2012-05-28 MED ORDER — SODIUM CHLORIDE 0.9 % IV SOLN
Freq: Once | INTRAVENOUS | Status: AC
  Administered 2012-05-28: 09:00:00 via INTRAVENOUS

## 2012-05-28 NOTE — Progress Notes (Signed)
Tolerated chemo well today. 

## 2012-05-31 ENCOUNTER — Other Ambulatory Visit: Payer: Self-pay

## 2012-06-04 ENCOUNTER — Inpatient Hospital Stay (HOSPITAL_COMMUNITY): Payer: Medicare Other

## 2012-06-04 ENCOUNTER — Encounter (HOSPITAL_BASED_OUTPATIENT_CLINIC_OR_DEPARTMENT_OTHER): Payer: Medicare Other

## 2012-06-04 VITALS — BP 124/74 | HR 114 | Temp 97.9°F | Resp 14 | Wt 134.8 lb

## 2012-06-04 DIAGNOSIS — C50911 Malignant neoplasm of unspecified site of right female breast: Secondary | ICD-10-CM

## 2012-06-04 LAB — CBC WITH DIFFERENTIAL/PLATELET
Hemoglobin: 9.4 g/dL — ABNORMAL LOW (ref 12.0–15.0)
Lymphocytes Relative: 4 % — ABNORMAL LOW (ref 12–46)
Lymphs Abs: 0.5 10*3/uL — ABNORMAL LOW (ref 0.7–4.0)
MCV: 94.9 fL (ref 78.0–100.0)
Monocytes Relative: 2 % — ABNORMAL LOW (ref 3–12)
Neutrophils Relative %: 94 % — ABNORMAL HIGH (ref 43–77)
Platelets: 369 10*3/uL (ref 150–400)
RBC: 2.94 MIL/uL — ABNORMAL LOW (ref 3.87–5.11)
WBC: 11.9 10*3/uL — ABNORMAL HIGH (ref 4.0–10.5)

## 2012-06-04 LAB — COMPREHENSIVE METABOLIC PANEL
ALT: 12 U/L (ref 0–35)
Alkaline Phosphatase: 83 U/L (ref 39–117)
CO2: 22 mEq/L (ref 19–32)
GFR calc Af Amer: >90 mL/min (ref >90)
GFR calc non Af Amer: >90 mL/min (ref >90)
Glucose, Bld: 290 mg/dL — ABNORMAL HIGH (ref 70–99)
Potassium: 4.2 mEq/L (ref 3.5–5.1)
Sodium: 132 mEq/L — ABNORMAL LOW (ref 135–145)
Total Bilirubin: 0.3 mg/dL (ref 0.3–1.2)

## 2012-06-04 MED ORDER — HEPARIN SOD (PORK) LOCK FLUSH 100 UNIT/ML IV SOLN
500.0000 [IU] | Freq: Once | INTRAVENOUS | Status: AC | PRN
  Administered 2012-06-04: 500 [IU]
  Filled 2012-06-04: qty 5

## 2012-06-04 MED ORDER — SODIUM CHLORIDE 0.9 % IV SOLN
Freq: Once | INTRAVENOUS | Status: AC
  Administered 2012-06-04: 11:00:00 via INTRAVENOUS

## 2012-06-04 MED ORDER — DEXTROSE 5 % IV SOLN
80.0000 mg/m2 | Freq: Once | INTRAVENOUS | Status: AC
  Administered 2012-06-04: 138 mg via INTRAVENOUS
  Filled 2012-06-04: qty 23

## 2012-06-04 MED ORDER — SODIUM CHLORIDE 0.9 % IV SOLN: 8.0000 mg | Freq: Once | INTRAVENOUS | Status: DC

## 2012-06-04 MED ORDER — DIPHENHYDRAMINE HCL 50 MG/ML IJ SOLN
INTRAMUSCULAR | Status: AC
  Filled 2012-06-04: qty 1

## 2012-06-04 MED ORDER — DEXAMETHASONE SODIUM PHOSPHATE 10 MG/ML IJ SOLN: 20.0000 mg | Freq: Once | INTRAMUSCULAR | Status: DC

## 2012-06-04 MED ORDER — FAMOTIDINE IN NACL 20-0.9 MG/50ML-% IV SOLN
INTRAVENOUS | Status: AC
  Filled 2012-06-04: qty 50

## 2012-06-04 MED ORDER — HEPARIN SOD (PORK) LOCK FLUSH 100 UNIT/ML IV SOLN
INTRAVENOUS | Status: AC
  Filled 2012-06-04: qty 5

## 2012-06-04 MED ORDER — FAMOTIDINE IN NACL 20-0.9 MG/50ML-% IV SOLN
20.0000 mg | Freq: Once | INTRAVENOUS | Status: AC
  Administered 2012-06-04: 20 mg via INTRAVENOUS

## 2012-06-04 MED ORDER — SODIUM CHLORIDE 0.9 % IJ SOLN
10.0000 mL | INTRAMUSCULAR | Status: DC | PRN
Start: 2012-06-04 — End: 2012-06-04
  Filled 2012-06-04: qty 10

## 2012-06-04 MED ORDER — DIPHENHYDRAMINE HCL 50 MG/ML IJ SOLN
50.0000 mg | Freq: Once | INTRAMUSCULAR | Status: AC
  Administered 2012-06-04: 50 mg via INTRAVENOUS

## 2012-06-04 MED ORDER — SODIUM CHLORIDE 0.9 % IV SOLN
Freq: Once | INTRAVENOUS | Status: AC
  Administered 2012-06-04: 8 mg via INTRAVENOUS
  Filled 2012-06-04: qty 4

## 2012-06-10 ENCOUNTER — Other Ambulatory Visit (HOSPITAL_COMMUNITY): Payer: Self-pay | Admitting: Oncology

## 2012-06-11 ENCOUNTER — Inpatient Hospital Stay (HOSPITAL_COMMUNITY): Payer: Medicare Other

## 2012-06-11 ENCOUNTER — Encounter (HOSPITAL_BASED_OUTPATIENT_CLINIC_OR_DEPARTMENT_OTHER): Payer: Medicare Other

## 2012-06-11 VITALS — BP 121/67 | HR 110 | Temp 97.2°F | Resp 18 | Wt 134.2 lb

## 2012-06-11 LAB — CBC WITH DIFFERENTIAL/PLATELET
Basophils Absolute: 0 10*3/uL (ref 0.0–0.1)
Basophils Relative: 0 % (ref 0–1)
Hemoglobin: 9.6 g/dL — ABNORMAL LOW (ref 12.0–15.0)
MCHC: 33.6 g/dL (ref 30.0–36.0)
Monocytes Relative: 2 % — ABNORMAL LOW (ref 3–12)
Neutro Abs: 6.2 10*3/uL (ref 1.7–7.7)
Neutrophils Relative %: 92 % — ABNORMAL HIGH (ref 43–77)
RBC: 2.93 MIL/uL — ABNORMAL LOW (ref 3.87–5.11)

## 2012-06-11 LAB — COMPREHENSIVE METABOLIC PANEL
ALT: 15 U/L (ref 0–35)
AST: 14 U/L (ref 0–37)
Albumin: 3.5 g/dL (ref 3.5–5.2)
Alkaline Phosphatase: 78 U/L (ref 39–117)
BUN: 18 mg/dL (ref 6–23)
Chloride: 94 mEq/L — ABNORMAL LOW (ref 96–112)
Potassium: 4 mEq/L (ref 3.5–5.1)
Total Bilirubin: 0.2 mg/dL — ABNORMAL LOW (ref 0.3–1.2)

## 2012-06-11 MED ORDER — HEPARIN SOD (PORK) LOCK FLUSH 100 UNIT/ML IV SOLN
500.0000 [IU] | Freq: Once | INTRAVENOUS | Status: DC | PRN
  Filled 2012-06-11: qty 5

## 2012-06-11 MED ORDER — DEXTROSE 5 % IV SOLN
80.0000 mg/m2 | Freq: Once | INTRAVENOUS | Status: AC
  Administered 2012-06-11: 138 mg via INTRAVENOUS
  Filled 2012-06-11: qty 23

## 2012-06-11 MED ORDER — SODIUM CHLORIDE 0.9 % IV SOLN
Freq: Once | INTRAVENOUS | Status: AC
  Administered 2012-06-11: 11:00:00 via INTRAVENOUS

## 2012-06-11 MED ORDER — SODIUM CHLORIDE 0.9 % IV SOLN
Freq: Once | INTRAVENOUS | Status: AC
  Administered 2012-06-11: 8 mg via INTRAVENOUS
  Filled 2012-06-11: qty 4

## 2012-06-11 MED ORDER — DIPHENHYDRAMINE HCL 50 MG/ML IJ SOLN
50.0000 mg | Freq: Once | INTRAMUSCULAR | Status: AC
  Administered 2012-06-11: 50 mg via INTRAVENOUS

## 2012-06-11 MED ORDER — FAMOTIDINE IN NACL 20-0.9 MG/50ML-% IV SOLN
INTRAVENOUS | Status: AC
  Filled 2012-06-11: qty 50

## 2012-06-11 MED ORDER — DIPHENHYDRAMINE HCL 50 MG/ML IJ SOLN
INTRAMUSCULAR | Status: AC
  Filled 2012-06-11: qty 1

## 2012-06-11 MED ORDER — FAMOTIDINE IN NACL 20-0.9 MG/50ML-% IV SOLN
20.0000 mg | Freq: Once | INTRAVENOUS | Status: AC
  Administered 2012-06-11: 20 mg via INTRAVENOUS

## 2012-06-11 MED ORDER — HEPARIN SOD (PORK) LOCK FLUSH 100 UNIT/ML IV SOLN
INTRAVENOUS | Status: AC
  Filled 2012-06-11: qty 5

## 2012-06-11 NOTE — Progress Notes (Signed)
Tolerated chemo well. Talked to dr. Mariel Sleet and po dexamethasone pre treatment and post treatment d/ced. Patient and her sister notified and calender changed.

## 2012-06-13 ENCOUNTER — Encounter (HOSPITAL_BASED_OUTPATIENT_CLINIC_OR_DEPARTMENT_OTHER): Payer: Medicare Other | Admitting: Oncology

## 2012-06-13 VITALS — BP 108/70 | HR 128 | Temp 97.3°F | Resp 18 | Wt 134.3 lb

## 2012-06-13 NOTE — Addendum Note (Signed)
Addended by: Oda Kilts on: 06/13/2012 02:11 PM   Modules accepted: Orders

## 2012-06-13 NOTE — Patient Instructions (Addendum)
[facility] Discharge Instructions  RECOMMENDATIONS MADE BY THE CONSULTANT AND ANY TEST RESULTS WILL BE SENT TO YOUR REFERRING PHYSICIAN.  EXAM FINDINGS BY THE PHYSICIAN TODAY AND SIGNS OR SYMPTOMS TO REPORT TO CLINIC OR PRIMARY PHYSICIAN: Exam and discussion by MD.  [name] are doing well with therapy.  Will need to complete all 12 weeks of taxol.  You have 9 more treatments to do.  You will also need to take radiation treatments which would start 2 - 3 weeks after chemotherapy.  You may not be able to have surgery due to the location of your tumor will have to wait and see.  MEDICATIONS PRESCRIBED:  none  INSTRUCTIONS GIVEN AND DISCUSSED: Report any new lumps, bone pain or shortness of breath, uncontrolled nausea, vomiting or fevers.  SPECIAL INSTRUCTIONS/FOLLOW-UP: Chemotherapy as scheduled and to see MD in 4 weeks for follow-up.  Thank you for choosing [facility] to provide your oncology and hematology care.  To afford each patient quality time with our providers, please arrive at least 15 minutes before your scheduled appointment time.  With your help, our goal is to use those 15 minutes to complete the necessary work-up to ensure our physicians have the information they need to help with your evaluation and healthcare recommendations.    Effective January 1st, 2014, we ask that you re-schedule your appointment with our physicians should you arrive 10 or more minutes late for your appointment.  We strive to give you quality time with our providers, and arriving late affects you and other patients whose appointments are after yours.    Again, thank you for choosing [facility].  Our hope is that these requests will decrease the amount of time that you wait before being seen by our physicians.       _____________________________________________________________  Should you have questions after your visit to [facility], please contact  our office at [phone] between the hours of 8:30 a.m. and 5:00 p.m.  Voicemails left after 4:30 p.m. will not be returned until the following business day.  For prescription refill requests, have your pharmacy contact our office with your prescription refill request.

## 2012-06-13 NOTE — Progress Notes (Signed)
#  1 inflammatory carcinoma the right breast presenting with an 8 x 6 cm mass in the lower inner quadrant with approximately 40% of her breast involved by erythema, skin involvement with extension down to and slightly below the inframammary fold medially and inferiorly. Did extend around the entire nipple areola complex. She is status post 4 cycles of neoadjuvant Adriamycin and Cytoxan for this triple negative breast cancer. She is now on weekly Taxol and has had 3 doses. She has done very well. She's had no nausea or vomiting with the Taxol. She has no numbness no tingling, no abdominal pain, and no constipation etc. She is once again accompanied by her 4 sisters who continue to have a host of questions.  Vital signs remained stable and she does not have any distinct issues.  I think the breast mass on the right is now' 4 x 4 cm is worse induration and there is definitely less erythema but still some darkish pigmentation not involving the entire areolar complex at this time. There is some mild pigmentation in the inferior to the inframammary fold but again not as bad. Presently there is no true erythema. This is more darkish pigmentation.  It still remains in my opinion fixed to the chest wall. The left breast is negative her port is intact lungs are clear. She has no lymphadenopathy in the cervical, supraclavicular, infraclavicular, axillary or inguinal areas. Abdomen remains soft and nontender but bowel sounds are diminished. Heart shows a regular rhythm and rate without murmur rub or gallop.  I think she has done very well. We will finish the 12 full weeks of Taxol then move on to radiation therapy unless she becomes operable which I do not think she will. I will get her back to see Dr. Malvin Johns potentially. I will see her in 4 weeks. We spent 25 minutes in consultation most of the time in counseling.

## 2012-06-18 ENCOUNTER — Inpatient Hospital Stay (HOSPITAL_COMMUNITY): Payer: Medicare Other

## 2012-06-18 ENCOUNTER — Encounter (HOSPITAL_COMMUNITY): Payer: Medicare Other | Attending: Oncology

## 2012-06-18 VITALS — BP 119/65 | HR 128 | Temp 98.6°F | Resp 16 | Wt 135.0 lb

## 2012-06-18 DIAGNOSIS — C50919 Malignant neoplasm of unspecified site of unspecified female breast: Secondary | ICD-10-CM | POA: Insufficient documentation

## 2012-06-18 DIAGNOSIS — Z5111 Encounter for antineoplastic chemotherapy: Secondary | ICD-10-CM

## 2012-06-18 DIAGNOSIS — C50911 Malignant neoplasm of unspecified site of right female breast: Secondary | ICD-10-CM

## 2012-06-18 LAB — CBC WITH DIFFERENTIAL/PLATELET
Eosinophils Relative: 1 % (ref 0–5)
HCT: 26.9 % — ABNORMAL LOW (ref 36.0–46.0)
Hemoglobin: 8.9 g/dL — ABNORMAL LOW (ref 12.0–15.0)
Lymphocytes Relative: 9 % — ABNORMAL LOW (ref 12–46)
Lymphs Abs: 0.9 10*3/uL (ref 0.7–4.0)
MCV: 99.6 fL (ref 78.0–100.0)
Monocytes Absolute: 1 10*3/uL (ref 0.1–1.0)
Monocytes Relative: 11 % (ref 3–12)
RDW: 18.4 % — ABNORMAL HIGH (ref 11.5–15.5)
WBC: 9.2 10*3/uL (ref 4.0–10.5)

## 2012-06-18 LAB — COMPREHENSIVE METABOLIC PANEL
BUN: 10 mg/dL (ref 6–23)
CO2: 25 mEq/L (ref 19–32)
Calcium: 10 mg/dL (ref 8.4–10.5)
Chloride: 98 mEq/L (ref 96–112)
Creatinine, Ser: 0.71 mg/dL (ref 0.50–1.10)
GFR calc Af Amer: >90 mL/min (ref >90)
GFR calc non Af Amer: >90 mL/min (ref >90)
Glucose, Bld: 191 mg/dL — ABNORMAL HIGH (ref 70–99)
Total Bilirubin: 0.2 mg/dL — ABNORMAL LOW (ref 0.3–1.2)

## 2012-06-18 MED ORDER — DIPHENHYDRAMINE HCL 50 MG/ML IJ SOLN
INTRAMUSCULAR | Status: AC
  Filled 2012-06-18: qty 1

## 2012-06-18 MED ORDER — HEPARIN SOD (PORK) LOCK FLUSH 100 UNIT/ML IV SOLN
INTRAVENOUS | Status: AC
  Filled 2012-06-18: qty 5

## 2012-06-18 MED ORDER — PACLITAXEL CHEMO INJECTION 300 MG/50ML
80.0000 mg/m2 | Freq: Once | INTRAVENOUS | Status: AC
  Administered 2012-06-18: 138 mg via INTRAVENOUS
  Filled 2012-06-18: qty 23

## 2012-06-18 MED ORDER — SODIUM CHLORIDE 0.9 % IV SOLN
Freq: Once | INTRAVENOUS | Status: AC
  Administered 2012-06-18: 12:00:00 via INTRAVENOUS

## 2012-06-18 MED ORDER — SODIUM CHLORIDE 0.9 % IJ SOLN
10.0000 mL | INTRAMUSCULAR | Status: DC | PRN
  Administered 2012-06-18: 10 mL
  Filled 2012-06-18: qty 10

## 2012-06-18 MED ORDER — SODIUM CHLORIDE 0.9 % IV SOLN
Freq: Once | INTRAVENOUS | Status: AC
  Administered 2012-06-18: 8 mg via INTRAVENOUS
  Filled 2012-06-18: qty 4

## 2012-06-18 MED ORDER — FAMOTIDINE IN NACL 20-0.9 MG/50ML-% IV SOLN
20.0000 mg | Freq: Once | INTRAVENOUS | Status: AC
  Administered 2012-06-18: 20 mg via INTRAVENOUS

## 2012-06-18 MED ORDER — FAMOTIDINE IN NACL 20-0.9 MG/50ML-% IV SOLN
INTRAVENOUS | Status: AC
  Filled 2012-06-18: qty 50

## 2012-06-18 MED ORDER — HEPARIN SOD (PORK) LOCK FLUSH 100 UNIT/ML IV SOLN
500.0000 [IU] | Freq: Once | INTRAVENOUS | Status: AC | PRN
  Administered 2012-06-18: 500 [IU]
  Filled 2012-06-18: qty 5

## 2012-06-18 MED ORDER — DIPHENHYDRAMINE HCL 50 MG/ML IJ SOLN
50.0000 mg | Freq: Once | INTRAMUSCULAR | Status: AC
  Administered 2012-06-18: 50 mg via INTRAVENOUS

## 2012-06-18 NOTE — Progress Notes (Signed)
Tolerated chemo well. 

## 2012-06-25 ENCOUNTER — Encounter (HOSPITAL_COMMUNITY): Payer: Self-pay | Admitting: Oncology

## 2012-06-25 ENCOUNTER — Encounter (HOSPITAL_BASED_OUTPATIENT_CLINIC_OR_DEPARTMENT_OTHER): Payer: Medicare Other

## 2012-06-25 ENCOUNTER — Other Ambulatory Visit (HOSPITAL_COMMUNITY): Payer: Self-pay | Admitting: Oncology

## 2012-06-25 ENCOUNTER — Encounter (HOSPITAL_BASED_OUTPATIENT_CLINIC_OR_DEPARTMENT_OTHER): Payer: Medicare Other | Admitting: Oncology

## 2012-06-25 DIAGNOSIS — R509 Fever, unspecified: Secondary | ICD-10-CM

## 2012-06-25 DIAGNOSIS — N390 Urinary tract infection, site not specified: Secondary | ICD-10-CM

## 2012-06-25 LAB — CBC WITH DIFFERENTIAL/PLATELET
Basophils Relative: 0 % (ref 0–1)
Eosinophils Relative: 0 % (ref 0–5)
HCT: 26.2 % — ABNORMAL LOW (ref 36.0–46.0)
Hemoglobin: 8.8 g/dL — ABNORMAL LOW (ref 12.0–15.0)
MCHC: 33.6 g/dL (ref 30.0–36.0)
MCV: 99.6 fL (ref 78.0–100.0)
Monocytes Absolute: 0.9 10*3/uL (ref 0.1–1.0)
Monocytes Relative: 9 % (ref 3–12)
Neutro Abs: 8.1 10*3/uL — ABNORMAL HIGH (ref 1.7–7.7)

## 2012-06-25 LAB — URINE MICROSCOPIC-ADD ON

## 2012-06-25 LAB — URINALYSIS, ROUTINE W REFLEX MICROSCOPIC
Bilirubin Urine: NEGATIVE
Glucose, UA: NEGATIVE mg/dL
Ketones, ur: NEGATIVE mg/dL
Nitrite: NEGATIVE
Protein, ur: 30 mg/dL — AB
Specific Gravity, Urine: 1.02 (ref 1.005–1.030)
Urobilinogen, UA: 0.2 mg/dL (ref 0.0–1.0)
pH: 5.5 (ref 5.0–8.0)

## 2012-06-25 LAB — COMPREHENSIVE METABOLIC PANEL
ALT: 19 U/L (ref 0–35)
AST: 19 U/L (ref 0–37)
Albumin: 3.2 g/dL — ABNORMAL LOW (ref 3.5–5.2)
Alkaline Phosphatase: 82 U/L (ref 39–117)
Calcium: 9.7 mg/dL (ref 8.4–10.5)
GFR calc Af Amer: 70 mL/min — ABNORMAL LOW (ref >90)
Potassium: 4.5 mEq/L (ref 3.5–5.1)
Sodium: 132 mEq/L — ABNORMAL LOW (ref 135–145)
Total Protein: 6.9 g/dL (ref 6.0–8.3)

## 2012-06-25 MED ORDER — HEPARIN SOD (PORK) LOCK FLUSH 100 UNIT/ML IV SOLN
INTRAVENOUS | Status: AC
  Filled 2012-06-25: qty 5

## 2012-06-25 MED ORDER — FAMOTIDINE IN NACL 20-0.9 MG/50ML-% IV SOLN
INTRAVENOUS | Status: AC
  Filled 2012-06-25: qty 50

## 2012-06-25 MED ORDER — ACETAMINOPHEN 500 MG PO TABS
ORAL_TABLET | ORAL | Status: AC
  Filled 2012-06-25: qty 2

## 2012-06-25 MED ORDER — DIPHENHYDRAMINE HCL 50 MG/ML IJ SOLN
50.0000 mg | Freq: Once | INTRAMUSCULAR | Status: AC
  Administered 2012-06-25: 50 mg via INTRAVENOUS

## 2012-06-25 MED ORDER — FAMOTIDINE IN NACL 20-0.9 MG/50ML-% IV SOLN
20.0000 mg | Freq: Once | INTRAVENOUS | Status: AC
  Administered 2012-06-25: 20 mg via INTRAVENOUS

## 2012-06-25 MED ORDER — SODIUM CHLORIDE 0.9 % IV SOLN
Freq: Once | INTRAVENOUS | Status: DC
  Filled 2012-06-25: qty 4

## 2012-06-25 MED ORDER — DIPHENHYDRAMINE HCL 50 MG/ML IJ SOLN
INTRAMUSCULAR | Status: AC
  Filled 2012-06-25: qty 1

## 2012-06-25 MED ORDER — SODIUM CHLORIDE 0.9 % IJ SOLN
10.0000 mL | INTRAMUSCULAR | Status: DC | PRN
  Filled 2012-06-25: qty 10

## 2012-06-25 MED ORDER — SULFAMETHOXAZOLE-TRIMETHOPRIM 800-160 MG PO TABS: 1.0000 | ORAL_TABLET | Freq: Two times a day (BID) | ORAL | Status: DC

## 2012-06-25 MED ORDER — SODIUM CHLORIDE 0.9 % IV SOLN
16.0000 mg | Freq: Once | INTRAVENOUS | Status: AC
  Administered 2012-06-25: 16 mg via INTRAVENOUS
  Filled 2012-06-25: qty 8

## 2012-06-25 MED ORDER — HEPARIN SOD (PORK) LOCK FLUSH 100 UNIT/ML IV SOLN
500.0000 [IU] | Freq: Once | INTRAVENOUS | Status: AC | PRN
  Administered 2012-06-25: 500 [IU]
  Filled 2012-06-25: qty 5

## 2012-06-25 MED ORDER — PACLITAXEL CHEMO INJECTION 300 MG/50ML
80.0000 mg/m2 | Freq: Once | INTRAVENOUS | Status: DC
  Filled 2012-06-25: qty 23

## 2012-06-25 MED ORDER — ACETAMINOPHEN 500 MG PO TABS
1000.0000 mg | ORAL_TABLET | Freq: Four times a day (QID) | ORAL | Status: DC | PRN
  Administered 2012-06-25: 1000 mg via ORAL

## 2012-06-25 MED ORDER — SODIUM CHLORIDE 0.9 % IV SOLN
Freq: Once | INTRAVENOUS | Status: AC
  Administered 2012-06-25: 11:00:00 via INTRAVENOUS

## 2012-06-25 NOTE — Progress Notes (Signed)
Patient was here for treatment with paclitaxel but she was noted to have a fever of 101.3 F.  She was treated with Tylenol and her fever resolved.  However, chemotherapy was held x 7 days and I was asked to see the patient.   The patient reports that after BM she wipe back to front.  She denies any urinary complaints.     She denies a cough, shaking chills, sputum production, nasal discharge.  UA was ordered.  On inspection, the urine is dark and cloudy.   PE: Gen: NAD, laying in bed Head: Atraumatic normocephalic, alopecia HEENT: trachea, midline Cardiac: RRR Lungs: CTA B/L Abd: No suprapubic tenderness on deep palpation Skin: Warm and dry Neuro: No focal deficits    Assessment: 1. Fever of 101.3 F when presenting to clinic for chemotherapy. 2. UTI, small leukocytes and many bacteria.   Plan: 1. Defer chemotherapy x 7 days 2. Bactrim DS x 8 days called in to pharmacy. 3. Return as scheduled for follow-up.  Patient and plan discussed with Dr. Glenford Peers and he is in agreement with the aforementioned.  KEFALAS,THOMAS

## 2012-06-25 NOTE — Progress Notes (Signed)
Patient presented today for chemotherapy, had fever, treatment held for a week.  UTI suspected and UA performed.  Antibiotics called into pharmacy for delivery to patient residence.  Patient and family informed.

## 2012-06-27 LAB — URINE CULTURE: Colony Count: >=100000

## 2012-07-02 ENCOUNTER — Encounter (HOSPITAL_BASED_OUTPATIENT_CLINIC_OR_DEPARTMENT_OTHER): Payer: Medicare Other | Admitting: Oncology

## 2012-07-02 ENCOUNTER — Encounter (HOSPITAL_BASED_OUTPATIENT_CLINIC_OR_DEPARTMENT_OTHER): Payer: Medicare Other

## 2012-07-02 ENCOUNTER — Inpatient Hospital Stay (HOSPITAL_COMMUNITY): Payer: Medicare Other

## 2012-07-02 VITALS — BP 108/53 | HR 128 | Temp 97.8°F | Resp 16 | Wt 132.4 lb

## 2012-07-02 DIAGNOSIS — D6481 Anemia due to antineoplastic chemotherapy: Secondary | ICD-10-CM

## 2012-07-02 DIAGNOSIS — C50319 Malignant neoplasm of lower-inner quadrant of unspecified female breast: Secondary | ICD-10-CM

## 2012-07-02 DIAGNOSIS — D649 Anemia, unspecified: Secondary | ICD-10-CM

## 2012-07-02 LAB — COMPREHENSIVE METABOLIC PANEL
ALT: 43 U/L — ABNORMAL HIGH (ref 0–35)
AST: 30 U/L (ref 0–37)
Alkaline Phosphatase: 110 U/L (ref 39–117)
CO2: 19 mEq/L (ref 19–32)
Calcium: 9.2 mg/dL (ref 8.4–10.5)
Potassium: 4.1 mEq/L (ref 3.5–5.1)
Sodium: 134 mEq/L — ABNORMAL LOW (ref 135–145)
Total Protein: 6.7 g/dL (ref 6.0–8.3)

## 2012-07-02 LAB — CBC WITH DIFFERENTIAL/PLATELET
Basophils Absolute: 0 10*3/uL (ref 0.0–0.1)
Eosinophils Absolute: 0 10*3/uL (ref 0.0–0.7)
Eosinophils Relative: 0 % (ref 0–5)
Lymphocytes Relative: 9 % — ABNORMAL LOW (ref 12–46)
Lymphs Abs: 1 10*3/uL (ref 0.7–4.0)
MCV: 100 fL (ref 78.0–100.0)
Neutrophils Relative %: 83 % — ABNORMAL HIGH (ref 43–77)
Platelets: 394 10*3/uL (ref 150–400)
RBC: 2.67 MIL/uL — ABNORMAL LOW (ref 3.87–5.11)
RDW: 19.3 % — ABNORMAL HIGH (ref 11.5–15.5)
WBC: 11.3 10*3/uL — ABNORMAL HIGH (ref 4.0–10.5)

## 2012-07-02 MED ORDER — FAMOTIDINE IN NACL 20-0.9 MG/50ML-% IV SOLN
20.0000 mg | Freq: Once | INTRAVENOUS | Status: AC
  Administered 2012-07-02: 20 mg via INTRAVENOUS

## 2012-07-02 MED ORDER — SODIUM CHLORIDE 0.9 % IV SOLN
Freq: Once | INTRAVENOUS | Status: AC
  Administered 2012-07-02: 12:00:00 via INTRAVENOUS

## 2012-07-02 MED ORDER — DIPHENHYDRAMINE HCL 50 MG/ML IJ SOLN
INTRAMUSCULAR | Status: AC
  Filled 2012-07-02: qty 1

## 2012-07-02 MED ORDER — DIPHENHYDRAMINE HCL 50 MG/ML IJ SOLN
50.0000 mg | Freq: Once | INTRAMUSCULAR | Status: AC
  Administered 2012-07-02: 50 mg via INTRAVENOUS

## 2012-07-02 MED ORDER — PACLITAXEL CHEMO INJECTION 300 MG/50ML
80.0000 mg/m2 | Freq: Once | INTRAVENOUS | Status: AC
  Administered 2012-07-02: 138 mg via INTRAVENOUS
  Filled 2012-07-02: qty 23

## 2012-07-02 MED ORDER — EPOETIN ALFA 40000 UNIT/ML IJ SOLN
40000.0000 [IU] | Freq: Once | INTRAMUSCULAR | Status: AC
  Administered 2012-07-02: 40000 [IU] via SUBCUTANEOUS

## 2012-07-02 MED ORDER — HEPARIN SOD (PORK) LOCK FLUSH 100 UNIT/ML IV SOLN
500.0000 [IU] | Freq: Once | INTRAVENOUS | Status: AC | PRN
  Administered 2012-07-02: 500 [IU]
  Filled 2012-07-02: qty 5

## 2012-07-02 MED ORDER — HEPARIN SOD (PORK) LOCK FLUSH 100 UNIT/ML IV SOLN
INTRAVENOUS | Status: AC
  Filled 2012-07-02: qty 5

## 2012-07-02 MED ORDER — FAMOTIDINE IN NACL 20-0.9 MG/50ML-% IV SOLN
INTRAVENOUS | Status: AC
  Filled 2012-07-02: qty 50

## 2012-07-02 MED ORDER — EPOETIN ALFA 40000 UNIT/ML IJ SOLN
INTRAMUSCULAR | Status: AC
  Filled 2012-07-02: qty 1

## 2012-07-02 MED ORDER — SODIUM CHLORIDE 0.9 % IV SOLN
Freq: Once | INTRAVENOUS | Status: AC
  Administered 2012-07-02: 8 mg via INTRAVENOUS
  Filled 2012-07-02: qty 4

## 2012-07-02 NOTE — Progress Notes (Signed)
[name] is here for chemotherapy.    I personally reviewed and went over laboratory results with the patient.  Her Hgb is trending downward.   We discussed Procrit therapy.  We discussed the risks, benefits, alternatives, and side effects of this supportive therapy plan.  I provided her education about the medication procrit.  We would administer every week at 40,000 Units.   She is agreeable to this plan.  I will build Procrit supportive therapy 40,000 units every week with chemotherapy.   All questions were answered.   [name]

## 2012-07-03 ENCOUNTER — Ambulatory Visit (HOSPITAL_COMMUNITY): Payer: Medicare Other | Admitting: Oncology

## 2012-07-09 ENCOUNTER — Inpatient Hospital Stay (HOSPITAL_COMMUNITY): Payer: Medicare Other

## 2012-07-09 ENCOUNTER — Encounter (HOSPITAL_BASED_OUTPATIENT_CLINIC_OR_DEPARTMENT_OTHER): Payer: Medicare Other

## 2012-07-09 ENCOUNTER — Encounter (HOSPITAL_BASED_OUTPATIENT_CLINIC_OR_DEPARTMENT_OTHER): Payer: Medicare Other | Admitting: Oncology

## 2012-07-09 VITALS — BP 101/66 | HR 128 | Temp 97.6°F | Resp 20 | Wt 131.4 lb

## 2012-07-09 DIAGNOSIS — D649 Anemia, unspecified: Secondary | ICD-10-CM

## 2012-07-09 DIAGNOSIS — C50911 Malignant neoplasm of unspecified site of right female breast: Secondary | ICD-10-CM

## 2012-07-09 DIAGNOSIS — Z171 Estrogen receptor negative status [ER-]: Secondary | ICD-10-CM

## 2012-07-09 DIAGNOSIS — Z5111 Encounter for antineoplastic chemotherapy: Secondary | ICD-10-CM

## 2012-07-09 DIAGNOSIS — B370 Candidal stomatitis: Secondary | ICD-10-CM

## 2012-07-09 DIAGNOSIS — C50319 Malignant neoplasm of lower-inner quadrant of unspecified female breast: Secondary | ICD-10-CM

## 2012-07-09 LAB — CBC WITH DIFFERENTIAL/PLATELET
Basophils Relative: 0 % (ref 0–1)
Eosinophils Absolute: 0 10*3/uL (ref 0.0–0.7)
Monocytes Absolute: 0.4 10*3/uL (ref 0.1–1.0)
Monocytes Relative: 6 % (ref 3–12)
Neutro Abs: 5 10*3/uL (ref 1.7–7.7)
Neutrophils Relative %: 76 % (ref 43–77)
Platelets: 313 10*3/uL (ref 150–400)
RBC: 2.83 MIL/uL — ABNORMAL LOW (ref 3.87–5.11)
RDW: 18.3 % — ABNORMAL HIGH (ref 11.5–15.5)
WBC: 6.6 10*3/uL (ref 4.0–10.5)

## 2012-07-09 LAB — COMPREHENSIVE METABOLIC PANEL
Alkaline Phosphatase: 99 U/L (ref 39–117)
BUN: 12 mg/dL (ref 6–23)
Chloride: 100 mEq/L (ref 96–112)
Creatinine, Ser: 0.78 mg/dL (ref 0.50–1.10)
GFR calc Af Amer: >90 mL/min (ref >90)
Glucose, Bld: 143 mg/dL — ABNORMAL HIGH (ref 70–99)
Potassium: 3.4 mEq/L — ABNORMAL LOW (ref 3.5–5.1)
Total Bilirubin: 0.2 mg/dL — ABNORMAL LOW (ref 0.3–1.2)

## 2012-07-09 MED ORDER — DIPHENHYDRAMINE HCL 50 MG/ML IJ SOLN
INTRAMUSCULAR | Status: AC
  Filled 2012-07-09: qty 1

## 2012-07-09 MED ORDER — HEPARIN SOD (PORK) LOCK FLUSH 100 UNIT/ML IV SOLN
INTRAVENOUS | Status: AC
  Filled 2012-07-09: qty 5

## 2012-07-09 MED ORDER — SODIUM CHLORIDE 0.9 % IV SOLN
Freq: Once | INTRAVENOUS | Status: AC
  Administered 2012-07-09: 8 mg via INTRAVENOUS
  Filled 2012-07-09: qty 4

## 2012-07-09 MED ORDER — EPOETIN ALFA 40000 UNIT/ML IJ SOLN
40000.0000 [IU] | Freq: Once | INTRAMUSCULAR | Status: AC
  Administered 2012-07-09: 40000 [IU] via SUBCUTANEOUS

## 2012-07-09 MED ORDER — HEPARIN SOD (PORK) LOCK FLUSH 100 UNIT/ML IV SOLN
500.0000 [IU] | Freq: Once | INTRAVENOUS | Status: AC | PRN
  Administered 2012-07-09: 500 [IU]
  Filled 2012-07-09: qty 5

## 2012-07-09 MED ORDER — PACLITAXEL CHEMO INJECTION 300 MG/50ML
80.0000 mg/m2 | Freq: Once | INTRAVENOUS | Status: AC
  Administered 2012-07-09: 138 mg via INTRAVENOUS
  Filled 2012-07-09: qty 23

## 2012-07-09 MED ORDER — FLUCONAZOLE 100 MG PO TABS: 100.0000 mg | ORAL_TABLET | Freq: Every day | ORAL | Status: DC

## 2012-07-09 MED ORDER — SODIUM CHLORIDE 0.9 % IV SOLN
Freq: Once | INTRAVENOUS | Status: AC
  Administered 2012-07-09: 12:00:00 via INTRAVENOUS

## 2012-07-09 MED ORDER — SODIUM CHLORIDE 0.9 % IJ SOLN
10.0000 mL | INTRAMUSCULAR | Status: DC | PRN
  Filled 2012-07-09: qty 10

## 2012-07-09 MED ORDER — DIPHENHYDRAMINE HCL 50 MG/ML IJ SOLN
50.0000 mg | Freq: Once | INTRAMUSCULAR | Status: AC
  Administered 2012-07-09: 50 mg via INTRAVENOUS

## 2012-07-09 MED ORDER — FAMOTIDINE IN NACL 20-0.9 MG/50ML-% IV SOLN
20.0000 mg | Freq: Once | INTRAVENOUS | Status: AC
  Administered 2012-07-09: 20 mg via INTRAVENOUS

## 2012-07-09 MED ORDER — MAGIC MOUTHWASH: 5.0000 mL | Freq: Four times a day (QID) | ORAL | Status: DC | PRN

## 2012-07-09 MED ORDER — EPOETIN ALFA 40000 UNIT/ML IJ SOLN
INTRAMUSCULAR | Status: AC
  Filled 2012-07-09: qty 1

## 2012-07-09 NOTE — Progress Notes (Signed)
Tolerated well

## 2012-07-09 NOTE — Patient Instructions (Addendum)
.  [facility] Discharge Instructions  RECOMMENDATIONS MADE BY THE CONSULTANT AND ANY TEST RESULTS WILL BE SENT TO YOUR REFERRING PHYSICIAN.  EXAM FINDINGS BY THE PHYSICIAN TODAY AND SIGNS OR SYMPTOMS TO REPORT TO CLINIC OR PRIMARY PHYSICIAN: Exam good. Dr. Mariel Sleet is well pleased with response to chemo. We will continue for 6 more treatments then get MRI and have surgeon again. MEDICATIONS PRESCRIBED:  Diflucan 100 mg take 1 daily for 7 days Refill called in on Magic mouthwash and you can use that 4 or 5 times a day. Swish and swallow Procrit given today and your hemoglobin is doing well INSTRUCTIONS GIVEN AND DISCUSSED: See Dr. Mariel Sleet back in 4 weeks     Thank you for choosing [facility] to provide your oncology and hematology care.  To afford each patient quality time with our providers, please arrive at least 15 minutes before your scheduled appointment time.  With your help, our goal is to use those 15 minutes to complete the necessary work-up to ensure our physicians have the information they need to help with your evaluation and healthcare recommendations.    Effective January 1st, 2014, we ask that you re-schedule your appointment with our physicians should you arrive 10 or more minutes late for your appointment.  We strive to give you quality time with our providers, and arriving late affects you and other patients whose appointments are after yours.    Again, thank you for choosing [facility].  Our hope is that these requests will decrease the amount of time that you wait before being seen by our physicians.       _____________________________________________________________  Should you have questions after your visit to [facility], please contact our office at [phone] between the hours of 8:30 a.m. and 5:00 p.m.  Voicemails left after 4:30 p.m. will not be returned until the following business day.  For  prescription refill requests, have your pharmacy contact our office with your prescription refill request.

## 2012-07-11 ENCOUNTER — Ambulatory Visit (HOSPITAL_COMMUNITY): Payer: Medicare Other | Admitting: Oncology

## 2012-07-11 NOTE — Progress Notes (Signed)
#  1 inflammatory carcinoma the right breast presenting with an 8 x 6 cm mass in the lower inner quadrant approximately 40% of her breast involvement erythema, skin involvement with extension down to and below the inframammary fold medially and inferiorly. It also extended around the entire nipple areola complex. She took Adriamycin and Cytoxan for 4 cycles in a neoadjuvant fashion and is now on weekly Taxol and is at week 6. She tolerates it very well with no nausea no vomiting just a little bit weakness. She has lost her hair.   She wanted to be seen today rather than on Friday when her appointment was scheduled. The breast mass is now reduced to 3 x 2 cm with much less induration. There is also much less erythema. I am not sure the mass is attached to the chest wall musculature but it might still be but is much much softer. Her dark is pigmentation is less.  She has no adenopathy in any location including cervical, supraclavicular, axillary, or angle areas. Abdomen remains soft and nontender without hepatosplenomegaly. Her lungs are clear. Heart shows a regular rhythm and rate without murmur rub or gallop. Port-A-Cath is intact.  I think she has done very well. Her throat also has a little bit of candida and we will treat that with Magic mouthwash.  I think she is doing very well. She's had a lot of shrinkage with the Taxol which we will continue. Her cancer was ER -0%, PR essentially negative at 1% positive, HER-2/neu nonamplified. She essentially has a triple negative cancer. We could consider hormone therapy but it has a minimal chance of benefiting her with this very low PR receptor level.  We will have to consider an MRI of her breast potentially and to see Dr. Malvin Johns for possible a mastectomy if she continues to improve. I am very encouraged today. We will see her in 4 weeks

## 2012-07-16 ENCOUNTER — Encounter (HOSPITAL_COMMUNITY): Payer: Medicare Other | Attending: Oncology

## 2012-07-16 ENCOUNTER — Inpatient Hospital Stay (HOSPITAL_COMMUNITY): Payer: Medicare Other

## 2012-07-16 DIAGNOSIS — C50911 Malignant neoplasm of unspecified site of right female breast: Secondary | ICD-10-CM

## 2012-07-16 DIAGNOSIS — C50319 Malignant neoplasm of lower-inner quadrant of unspecified female breast: Secondary | ICD-10-CM

## 2012-07-16 DIAGNOSIS — C50919 Malignant neoplasm of unspecified site of unspecified female breast: Secondary | ICD-10-CM | POA: Insufficient documentation

## 2012-07-16 DIAGNOSIS — Z5111 Encounter for antineoplastic chemotherapy: Secondary | ICD-10-CM

## 2012-07-16 DIAGNOSIS — D649 Anemia, unspecified: Secondary | ICD-10-CM

## 2012-07-16 LAB — CBC WITH DIFFERENTIAL/PLATELET
Basophils Relative: 1 % (ref 0–1)
Eosinophils Absolute: 0 10*3/uL (ref 0.0–0.7)
Eosinophils Relative: 1 % (ref 0–5)
Hemoglobin: 9.8 g/dL — ABNORMAL LOW (ref 12.0–15.0)
Lymphs Abs: 1 10*3/uL (ref 0.7–4.0)
MCH: 32.8 pg (ref 26.0–34.0)
MCHC: 31.9 g/dL (ref 30.0–36.0)
MCV: 102.7 fL — ABNORMAL HIGH (ref 78.0–100.0)
Monocytes Absolute: 0.4 10*3/uL (ref 0.1–1.0)
Monocytes Relative: 10 % (ref 3–12)
Neutrophils Relative %: 64 % (ref 43–77)
RBC: 2.99 MIL/uL — ABNORMAL LOW (ref 3.87–5.11)

## 2012-07-16 LAB — COMPREHENSIVE METABOLIC PANEL
Albumin: 3.5 g/dL (ref 3.5–5.2)
Alkaline Phosphatase: 91 U/L (ref 39–117)
BUN: 8 mg/dL (ref 6–23)
Calcium: 9.8 mg/dL (ref 8.4–10.5)
Creatinine, Ser: 0.83 mg/dL (ref 0.50–1.10)
GFR calc Af Amer: 86 mL/min — ABNORMAL LOW (ref >90)
Glucose, Bld: 131 mg/dL — ABNORMAL HIGH (ref 70–99)
Potassium: 4.4 mEq/L (ref 3.5–5.1)
Total Protein: 7 g/dL (ref 6.0–8.3)

## 2012-07-16 MED ORDER — HEPARIN SOD (PORK) LOCK FLUSH 100 UNIT/ML IV SOLN
500.0000 [IU] | Freq: Once | INTRAVENOUS | Status: AC | PRN
  Administered 2012-07-16: 500 [IU]
  Filled 2012-07-16: qty 5

## 2012-07-16 MED ORDER — ONDANSETRON HCL 40 MG/20ML IJ SOLN
Freq: Once | INTRAMUSCULAR | Status: AC
  Administered 2012-07-16: 8 mg via INTRAVENOUS
  Filled 2012-07-16: qty 4

## 2012-07-16 MED ORDER — FAMOTIDINE IN NACL 20-0.9 MG/50ML-% IV SOLN
INTRAVENOUS | Status: AC
  Filled 2012-07-16: qty 50

## 2012-07-16 MED ORDER — FAMOTIDINE IN NACL 20-0.9 MG/50ML-% IV SOLN
20.0000 mg | Freq: Once | INTRAVENOUS | Status: AC
  Administered 2012-07-16: 20 mg via INTRAVENOUS

## 2012-07-16 MED ORDER — PACLITAXEL CHEMO INJECTION 300 MG/50ML
80.0000 mg/m2 | Freq: Once | INTRAVENOUS | Status: AC
  Administered 2012-07-16: 138 mg via INTRAVENOUS
  Filled 2012-07-16: qty 23

## 2012-07-16 MED ORDER — HEPARIN SOD (PORK) LOCK FLUSH 100 UNIT/ML IV SOLN
INTRAVENOUS | Status: AC
  Filled 2012-07-16: qty 5

## 2012-07-16 MED ORDER — DIPHENHYDRAMINE HCL 50 MG/ML IJ SOLN
INTRAMUSCULAR | Status: AC
  Filled 2012-07-16: qty 1

## 2012-07-16 MED ORDER — SODIUM CHLORIDE 0.9 % IV SOLN
Freq: Once | INTRAVENOUS | Status: AC
  Administered 2012-07-16: 12:00:00 via INTRAVENOUS

## 2012-07-16 MED ORDER — EPOETIN ALFA 40000 UNIT/ML IJ SOLN
40000.0000 [IU] | Freq: Once | INTRAMUSCULAR | Status: AC
  Administered 2012-07-16: 40000 [IU] via SUBCUTANEOUS

## 2012-07-16 MED ORDER — EPOETIN ALFA 40000 UNIT/ML IJ SOLN
INTRAMUSCULAR | Status: AC
  Filled 2012-07-16: qty 1

## 2012-07-16 MED ORDER — DIPHENHYDRAMINE HCL 50 MG/ML IJ SOLN
50.0000 mg | Freq: Once | INTRAMUSCULAR | Status: AC
  Administered 2012-07-16: 50 mg via INTRAVENOUS

## 2012-07-23 ENCOUNTER — Encounter (HOSPITAL_BASED_OUTPATIENT_CLINIC_OR_DEPARTMENT_OTHER): Payer: Medicare Other

## 2012-07-23 VITALS — BP 116/77 | HR 124 | Temp 98.2°F | Resp 24 | Wt 132.0 lb

## 2012-07-23 DIAGNOSIS — Z5111 Encounter for antineoplastic chemotherapy: Secondary | ICD-10-CM

## 2012-07-23 DIAGNOSIS — C50319 Malignant neoplasm of lower-inner quadrant of unspecified female breast: Secondary | ICD-10-CM

## 2012-07-23 DIAGNOSIS — C50911 Malignant neoplasm of unspecified site of right female breast: Secondary | ICD-10-CM

## 2012-07-23 LAB — COMPREHENSIVE METABOLIC PANEL
Albumin: 3.6 g/dL (ref 3.5–5.2)
BUN: 9 mg/dL (ref 6–23)
Calcium: 9.4 mg/dL (ref 8.4–10.5)
Chloride: 103 mEq/L (ref 96–112)
Creatinine, Ser: 0.77 mg/dL (ref 0.50–1.10)
Total Bilirubin: 0.2 mg/dL — ABNORMAL LOW (ref 0.3–1.2)

## 2012-07-23 LAB — CBC WITH DIFFERENTIAL/PLATELET
Basophils Relative: 0 % (ref 0–1)
Eosinophils Absolute: 0 10*3/uL (ref 0.0–0.7)
Eosinophils Relative: 1 % (ref 0–5)
HCT: 32.8 % — ABNORMAL LOW (ref 36.0–46.0)
Hemoglobin: 10.5 g/dL — ABNORMAL LOW (ref 12.0–15.0)
MCH: 33.3 pg (ref 26.0–34.0)
MCHC: 32 g/dL (ref 30.0–36.0)
MCV: 104.1 fL — ABNORMAL HIGH (ref 78.0–100.0)
Monocytes Absolute: 0.4 10*3/uL (ref 0.1–1.0)
Monocytes Relative: 8 % (ref 3–12)
Neutro Abs: 3.3 10*3/uL (ref 1.7–7.7)
RDW: 20.2 % — ABNORMAL HIGH (ref 11.5–15.5)

## 2012-07-23 MED ORDER — PACLITAXEL CHEMO INJECTION 300 MG/50ML
80.0000 mg/m2 | Freq: Once | INTRAVENOUS | Status: AC
  Administered 2012-07-23: 138 mg via INTRAVENOUS
  Filled 2012-07-23: qty 23

## 2012-07-23 MED ORDER — FAMOTIDINE IN NACL 20-0.9 MG/50ML-% IV SOLN
INTRAVENOUS | Status: AC
  Filled 2012-07-23: qty 50

## 2012-07-23 MED ORDER — SODIUM CHLORIDE 0.9 % IJ SOLN
10.0000 mL | INTRAMUSCULAR | Status: DC | PRN
  Filled 2012-07-23: qty 10

## 2012-07-23 MED ORDER — SODIUM CHLORIDE 0.9 % IV SOLN
Freq: Once | INTRAVENOUS | Status: AC
  Administered 2012-07-23: 12:00:00 via INTRAVENOUS

## 2012-07-23 MED ORDER — HEPARIN SOD (PORK) LOCK FLUSH 100 UNIT/ML IV SOLN
INTRAVENOUS | Status: AC
  Filled 2012-07-23: qty 5

## 2012-07-23 MED ORDER — FAMOTIDINE IN NACL 20-0.9 MG/50ML-% IV SOLN
20.0000 mg | Freq: Once | INTRAVENOUS | Status: AC
  Administered 2012-07-23: 20 mg via INTRAVENOUS

## 2012-07-23 MED ORDER — DIPHENHYDRAMINE HCL 50 MG/ML IJ SOLN
INTRAMUSCULAR | Status: AC
  Filled 2012-07-23: qty 1

## 2012-07-23 MED ORDER — DIPHENHYDRAMINE HCL 50 MG/ML IJ SOLN
50.0000 mg | Freq: Once | INTRAMUSCULAR | Status: AC
  Administered 2012-07-23: 50 mg via INTRAVENOUS

## 2012-07-23 MED ORDER — HEPARIN SOD (PORK) LOCK FLUSH 100 UNIT/ML IV SOLN
500.0000 [IU] | Freq: Once | INTRAVENOUS | Status: AC | PRN
  Administered 2012-07-23: 500 [IU]
  Filled 2012-07-23: qty 5

## 2012-07-23 MED ORDER — SODIUM CHLORIDE 0.9 % IV SOLN
Freq: Once | INTRAVENOUS | Status: AC
  Administered 2012-07-23: 8 mg via INTRAVENOUS
  Filled 2012-07-23: qty 4

## 2012-07-23 NOTE — Progress Notes (Signed)
Tolerated chemo well. Procrit held due to hgb 10.5

## 2012-07-23 NOTE — Patient Instructions (Addendum)
[facility] Discharge Instructions for Patients Receiving Chemotherapy  Today you received the following chemotherapy agents  Taxol  To help prevent nausea and vomiting after your treatment, we encourage you to take your nausea medication as directed.  Your hemoglobin is up to 10.5 so you do not need the procrit.   If you develop nausea and vomiting that is not controlled by your nausea medication, call the clinic. If it is after clinic hours your family physician or the after hours number for the clinic or go to the Emergency Department.   BELOW ARE SYMPTOMS THAT SHOULD BE REPORTED IMMEDIATELY:  *FEVER GREATER THAN 101.0 F  *CHILLS WITH OR WITHOUT FEVER  NAUSEA AND VOMITING THAT IS NOT CONTROLLED WITH YOUR NAUSEA MEDICATION  *UNUSUAL SHORTNESS OF BREATH  *UNUSUAL BRUISING OR BLEEDING  TENDERNESS IN MOUTH AND THROAT WITH OR WITHOUT PRESENCE OF ULCERS  *URINARY PROBLEMS  *BOWEL PROBLEMS  UNUSUAL RASH Items with * indicate a potential emergency and should be followed up as soon as possible.  One of the nurses will contact you 24 hours after your treatment. Please let the nurse know about any problems that you may have experienced. Feel free to call the clinic you have any questions or concerns. The clinic phone number is [phone].   I have been informed and understand all the instructions given to me. I know to contact the clinic, my physician, or go to the Emergency Department if any problems should occur. I do not have any questions at this time, but understand that I may call the clinic during office hours or the Patient Navigator at [phone] should I have any questions or need assistance in obtaining follow up care.    __________________________________________  _____________  __________ Signature of Patient or Authorized Representative            Date                   Time    __________________________________________ Nurse's Signature

## 2012-07-30 ENCOUNTER — Encounter (HOSPITAL_BASED_OUTPATIENT_CLINIC_OR_DEPARTMENT_OTHER): Payer: Medicare Other

## 2012-07-30 VITALS — BP 147/85 | HR 121 | Temp 97.4°F | Resp 18 | Wt 135.1 lb

## 2012-07-30 DIAGNOSIS — C50911 Malignant neoplasm of unspecified site of right female breast: Secondary | ICD-10-CM

## 2012-07-30 DIAGNOSIS — C50319 Malignant neoplasm of lower-inner quadrant of unspecified female breast: Secondary | ICD-10-CM

## 2012-07-30 DIAGNOSIS — Z5111 Encounter for antineoplastic chemotherapy: Secondary | ICD-10-CM

## 2012-07-30 LAB — CBC WITH DIFFERENTIAL/PLATELET
Basophils Absolute: 0 10*3/uL (ref 0.0–0.1)
Basophils Relative: 1 % (ref 0–1)
Eosinophils Absolute: 0 10*3/uL (ref 0.0–0.7)
MCH: 33.9 pg (ref 26.0–34.0)
MCHC: 32.9 g/dL (ref 30.0–36.0)
Monocytes Relative: 7 % (ref 3–12)
Neutrophils Relative %: 76 % (ref 43–77)
Platelets: 245 10*3/uL (ref 150–400)
RDW: 18.7 % — ABNORMAL HIGH (ref 11.5–15.5)

## 2012-07-30 LAB — COMPREHENSIVE METABOLIC PANEL
AST: 18 U/L (ref 0–37)
Albumin: 3.6 g/dL (ref 3.5–5.2)
Alkaline Phosphatase: 70 U/L (ref 39–117)
BUN: 8 mg/dL (ref 6–23)
Potassium: 3.9 mEq/L (ref 3.5–5.1)
Sodium: 138 mEq/L (ref 135–145)
Total Protein: 6.7 g/dL (ref 6.0–8.3)

## 2012-07-30 MED ORDER — HEPARIN SOD (PORK) LOCK FLUSH 100 UNIT/ML IV SOLN
500.0000 [IU] | Freq: Once | INTRAVENOUS | Status: AC | PRN
  Administered 2012-07-30: 500 [IU]
  Filled 2012-07-30: qty 5

## 2012-07-30 MED ORDER — FAMOTIDINE IN NACL 20-0.9 MG/50ML-% IV SOLN
20.0000 mg | Freq: Once | INTRAVENOUS | Status: AC
  Administered 2012-07-30: 20 mg via INTRAVENOUS

## 2012-07-30 MED ORDER — PACLITAXEL CHEMO INJECTION 300 MG/50ML
80.0000 mg/m2 | Freq: Once | INTRAVENOUS | Status: AC
  Administered 2012-07-30: 138 mg via INTRAVENOUS
  Filled 2012-07-30: qty 23

## 2012-07-30 MED ORDER — HEPARIN SOD (PORK) LOCK FLUSH 100 UNIT/ML IV SOLN
INTRAVENOUS | Status: AC
  Filled 2012-07-30: qty 5

## 2012-07-30 MED ORDER — SODIUM CHLORIDE 0.9 % IV SOLN
Freq: Once | INTRAVENOUS | Status: AC
  Administered 2012-07-30: 12:00:00 via INTRAVENOUS

## 2012-07-30 MED ORDER — DIPHENHYDRAMINE HCL 50 MG/ML IJ SOLN
50.0000 mg | Freq: Once | INTRAMUSCULAR | Status: AC
  Administered 2012-07-30: 50 mg via INTRAVENOUS

## 2012-07-30 MED ORDER — DIPHENHYDRAMINE HCL 50 MG/ML IJ SOLN
INTRAMUSCULAR | Status: AC
  Filled 2012-07-30: qty 1

## 2012-07-30 MED ORDER — FAMOTIDINE IN NACL 20-0.9 MG/50ML-% IV SOLN
INTRAVENOUS | Status: AC
  Filled 2012-07-30: qty 50

## 2012-07-30 MED ORDER — ONDANSETRON HCL 40 MG/20ML IJ SOLN
Freq: Once | INTRAMUSCULAR | Status: AC
  Administered 2012-07-30: 8 mg via INTRAVENOUS
  Filled 2012-07-30: qty 4

## 2012-07-31 LAB — CANCER ANTIGEN 27.29: CA 27.29: 49 U/mL — ABNORMAL HIGH (ref 0–39)

## 2012-08-06 ENCOUNTER — Encounter (HOSPITAL_BASED_OUTPATIENT_CLINIC_OR_DEPARTMENT_OTHER): Payer: Medicare Other | Admitting: Oncology

## 2012-08-06 ENCOUNTER — Encounter (HOSPITAL_BASED_OUTPATIENT_CLINIC_OR_DEPARTMENT_OTHER): Payer: Medicare Other

## 2012-08-06 VITALS — BP 122/80 | HR 125 | Temp 98.1°F | Resp 24 | Wt 136.4 lb

## 2012-08-06 DIAGNOSIS — Z171 Estrogen receptor negative status [ER-]: Secondary | ICD-10-CM

## 2012-08-06 DIAGNOSIS — C50911 Malignant neoplasm of unspecified site of right female breast: Secondary | ICD-10-CM

## 2012-08-06 DIAGNOSIS — Z5111 Encounter for antineoplastic chemotherapy: Secondary | ICD-10-CM

## 2012-08-06 DIAGNOSIS — C50319 Malignant neoplasm of lower-inner quadrant of unspecified female breast: Secondary | ICD-10-CM

## 2012-08-06 LAB — CBC WITH DIFFERENTIAL/PLATELET
Hemoglobin: 11.1 g/dL — ABNORMAL LOW (ref 12.0–15.0)
Lymphocytes Relative: 19 % (ref 12–46)
Lymphs Abs: 1.1 10*3/uL (ref 0.7–4.0)
Monocytes Relative: 6 % (ref 3–12)
Neutro Abs: 4.3 10*3/uL (ref 1.7–7.7)
Neutrophils Relative %: 74 % (ref 43–77)
Platelets: 241 10*3/uL (ref 150–400)
RBC: 3.24 MIL/uL — ABNORMAL LOW (ref 3.87–5.11)
WBC: 5.8 10*3/uL (ref 4.0–10.5)

## 2012-08-06 LAB — COMPREHENSIVE METABOLIC PANEL
CO2: 22 mEq/L (ref 19–32)
Calcium: 9.4 mg/dL (ref 8.4–10.5)
Creatinine, Ser: 0.7 mg/dL (ref 0.50–1.10)
GFR calc Af Amer: >90 mL/min (ref >90)
GFR calc non Af Amer: >90 mL/min (ref >90)
Glucose, Bld: 235 mg/dL — ABNORMAL HIGH (ref 70–99)

## 2012-08-06 MED ORDER — SODIUM CHLORIDE 0.9 % IV SOLN
Freq: Once | INTRAVENOUS | Status: AC
  Administered 2012-08-06: 8 mg via INTRAVENOUS
  Filled 2012-08-06: qty 4

## 2012-08-06 MED ORDER — DIPHENHYDRAMINE HCL 50 MG/ML IJ SOLN
50.0000 mg | Freq: Once | INTRAMUSCULAR | Status: AC
  Administered 2012-08-06: 50 mg via INTRAVENOUS

## 2012-08-06 MED ORDER — SODIUM CHLORIDE 0.9 % IJ SOLN
10.0000 mL | INTRAMUSCULAR | Status: DC | PRN
  Administered 2012-08-06: 10 mL
  Filled 2012-08-06: qty 10

## 2012-08-06 MED ORDER — SODIUM CHLORIDE 0.9 % IV SOLN
Freq: Once | INTRAVENOUS | Status: AC
  Administered 2012-08-06: 13:00:00 via INTRAVENOUS

## 2012-08-06 MED ORDER — PACLITAXEL CHEMO INJECTION 300 MG/50ML
80.0000 mg/m2 | Freq: Once | INTRAVENOUS | Status: AC
  Administered 2012-08-06: 138 mg via INTRAVENOUS
  Filled 2012-08-06: qty 23

## 2012-08-06 MED ORDER — DIPHENHYDRAMINE HCL 50 MG/ML IJ SOLN
INTRAMUSCULAR | Status: AC
  Filled 2012-08-06: qty 1

## 2012-08-06 MED ORDER — FAMOTIDINE IN NACL 20-0.9 MG/50ML-% IV SOLN
20.0000 mg | Freq: Once | INTRAVENOUS | Status: AC
  Administered 2012-08-06: 20 mg via INTRAVENOUS

## 2012-08-06 MED ORDER — HEPARIN SOD (PORK) LOCK FLUSH 100 UNIT/ML IV SOLN
500.0000 [IU] | Freq: Once | INTRAVENOUS | Status: AC | PRN
  Administered 2012-08-06: 500 [IU]
  Filled 2012-08-06: qty 5

## 2012-08-06 MED ORDER — FAMOTIDINE IN NACL 20-0.9 MG/50ML-% IV SOLN
INTRAVENOUS | Status: AC
  Filled 2012-08-06: qty 50

## 2012-08-06 NOTE — Progress Notes (Signed)
Tolerated chemo well. 

## 2012-08-06 NOTE — Progress Notes (Signed)
#  1 inflammatory carcinoma of the right breast presenting with an 8 x 6 cm mass in the lower inner quadrant approximately 40% of her breast involved by erythema. She had skin involvement with extension down to and below the inframammary fold medially and inferiorly and it also extended around the entire nipple areola complex. She had 4 cycles of adjuvant Adriamycin and Cytoxan for this triple negative breast cancer. She has now had 9 doses of weekly Taxol with 3 remaining. She continues to have improvement.  She is accompanied by her 4 sisters.  She still has an increased pigmented area of thickened skin that is approximately 4 cm or slightly less by approximately 1-1/2 cm wide but the mass is no longer palpable as a mass. This is more thickening than a true mass. She still has no lymphadenopathy in the cervical, supraclavicular, infraclavicular, axillary regions. Her liver is not palpably enlarged nor is her spleen. Her lungs are clear. Heart shows a regular rhythm and rate without murmur rub or gallop. Her Port-A-Cath is intact.  So we now have more darkish pigmentation with some scattered pigmented areas in the medial inner quadrant specifically more than any other place but also on the superior portion of the chest wall below the inframammary fold. I do not think there is any attachment of this thickened area to the pectoralis muscles however I still believe it might be very difficult to do surgical removal of the breast and this area to obtain negative margins.  She has had a tremendous response however. I will talk to Dr. Malvin Johns about his thoughts. I think we both need to see her after completion of the 12 doses of weekly Taxol.  I think one or 2 of the sisters do not want her to have surgery which is fine as long as we all understand that even with surgery we may not be wouldn't cure her. Radiation many help [place] control this disease for some time but I don't think by itself it is the answer either  so as I discussed with all the sisters and the patient the ideal is neoadjuvant chemotherapy followed by surgery followed by radiation therapy.  I will see her back in 3 or 4 weeks I will discuss her with Dr. Malvin Johns. We had a very long discussion between myself and all of the sisters and the patient.

## 2012-08-06 NOTE — Patient Instructions (Addendum)
[facility] Discharge Instructions  RECOMMENDATIONS MADE BY THE CONSULTANT AND ANY TEST RESULTS WILL BE SENT TO YOUR REFERRING PHYSICIAN.  EXAM FINDINGS BY THE PHYSICIAN TODAY AND SIGNS OR SYMPTOMS TO REPORT TO CLINIC OR PRIMARY PHYSICIAN:   Just 2 more chemo treatments to go.   To be seen by Dr. Malvin Johns 1 week after the last chemo 08/27/12 @ 2:00  To be seen back in the clinic by Dr. Mariel Sleet on 08/29/12 @ 3:30   Thank you for choosing [facility] to provide your oncology and hematology care.  To afford each patient quality time with our providers, please arrive at least 15 minutes before your scheduled appointment time.  With your help, our goal is to use those 15 minutes to complete the necessary work-up to ensure our physicians have the information they need to help with your evaluation and healthcare recommendations.    Effective January 1st, 2014, we ask that you re-schedule your appointment with our physicians should you arrive 10 or more minutes late for your appointment.  We strive to give you quality time with our providers, and arriving late affects you and other patients whose appointments are after yours.    Again, thank you for choosing [facility]  Our hope is that these requests will decrease the amount of time that you wait before being seen by our physicians.       _____________________________________________________________  Should you have questions after your visit to [facility], please contact our office at [phone] between the hours of 8:30 a.m. and 5:00 p.m.  Voicemails left after 4:30 p.m. will not be returned until the following business day.  For prescription refill requests, have your pharmacy contact our office with your prescription refill request.

## 2012-08-12 ENCOUNTER — Other Ambulatory Visit (HOSPITAL_COMMUNITY): Payer: Self-pay | Admitting: Oncology

## 2012-08-13 ENCOUNTER — Encounter (HOSPITAL_BASED_OUTPATIENT_CLINIC_OR_DEPARTMENT_OTHER): Payer: Medicare Other

## 2012-08-13 VITALS — BP 139/86 | HR 133 | Temp 98.9°F | Resp 18 | Wt 136.0 lb

## 2012-08-13 DIAGNOSIS — Z5111 Encounter for antineoplastic chemotherapy: Secondary | ICD-10-CM

## 2012-08-13 DIAGNOSIS — C50319 Malignant neoplasm of lower-inner quadrant of unspecified female breast: Secondary | ICD-10-CM

## 2012-08-13 DIAGNOSIS — C50911 Malignant neoplasm of unspecified site of right female breast: Secondary | ICD-10-CM

## 2012-08-13 LAB — CBC WITH DIFFERENTIAL/PLATELET
Basophils Relative: 0 % (ref 0–1)
Eosinophils Absolute: 0 10*3/uL (ref 0.0–0.7)
Eosinophils Relative: 1 % (ref 0–5)
MCH: 33.8 pg (ref 26.0–34.0)
MCHC: 33.7 g/dL (ref 30.0–36.0)
MCV: 100.3 fL — ABNORMAL HIGH (ref 78.0–100.0)
Neutrophils Relative %: 73 % (ref 43–77)
Platelets: 220 10*3/uL (ref 150–400)
RBC: 3.46 MIL/uL — ABNORMAL LOW (ref 3.87–5.11)
RDW: 16.5 % — ABNORMAL HIGH (ref 11.5–15.5)

## 2012-08-13 LAB — COMPREHENSIVE METABOLIC PANEL
Albumin: 3.6 g/dL (ref 3.5–5.2)
Calcium: 9.6 mg/dL (ref 8.4–10.5)
GFR calc non Af Amer: 89 mL/min — ABNORMAL LOW (ref >90)
Potassium: 4.1 mEq/L (ref 3.5–5.1)
Sodium: 138 mEq/L (ref 135–145)
Total Bilirubin: 0.2 mg/dL — ABNORMAL LOW (ref 0.3–1.2)
Total Protein: 6.9 g/dL (ref 6.0–8.3)

## 2012-08-13 MED ORDER — SODIUM CHLORIDE 0.9 % IV SOLN
Freq: Once | INTRAVENOUS | Status: AC
  Administered 2012-08-13: 11:00:00 via INTRAVENOUS

## 2012-08-13 MED ORDER — SODIUM CHLORIDE 0.9 % IV SOLN
Freq: Once | INTRAVENOUS | Status: AC
  Administered 2012-08-13: 8 mg via INTRAVENOUS
  Filled 2012-08-13: qty 4

## 2012-08-13 MED ORDER — FAMOTIDINE IN NACL 20-0.9 MG/50ML-% IV SOLN
20.0000 mg | Freq: Once | INTRAVENOUS | Status: AC
  Administered 2012-08-13: 20 mg via INTRAVENOUS

## 2012-08-13 MED ORDER — SODIUM CHLORIDE 0.9 % IJ SOLN
10.0000 mL | INTRAMUSCULAR | Status: DC | PRN
  Filled 2012-08-13: qty 10

## 2012-08-13 MED ORDER — HEPARIN SOD (PORK) LOCK FLUSH 100 UNIT/ML IV SOLN
500.0000 [IU] | Freq: Once | INTRAVENOUS | Status: AC | PRN
  Administered 2012-08-13: 500 [IU]
  Filled 2012-08-13: qty 5

## 2012-08-13 MED ORDER — DIPHENHYDRAMINE HCL 50 MG/ML IJ SOLN
50.0000 mg | Freq: Once | INTRAMUSCULAR | Status: AC
  Administered 2012-08-13: 50 mg via INTRAVENOUS

## 2012-08-13 MED ORDER — DEXTROSE 5 % IV SOLN
80.0000 mg/m2 | Freq: Once | INTRAVENOUS | Status: AC
  Administered 2012-08-13: 138 mg via INTRAVENOUS
  Filled 2012-08-13: qty 23

## 2012-08-13 NOTE — Patient Instructions (Addendum)
.  [facility] Discharge Instructions  RECOMMENDATIONS MADE BY THE CONSULTANT AND ANY TEST RESULTS WILL BE SENT TO YOUR REFERRING PHYSICIAN.  EXAM FINDINGS BY THE PHYSICIAN TODAY AND SIGNS OR SYMPTOMS TO REPORT TO CLINIC OR PRIMARY PHYSICIAN: [name] looked at your rash. You can use calamine lotion or benadryl cream for itching.   SPECIAL INSTRUCTIONS/FOLLOW-UP: ONE MORE CHEMO!  Thank you for choosing [facility] to provide your oncology and hematology care.  To afford each patient quality time with our providers, please arrive at least 15 minutes before your scheduled appointment time.  With your help, our goal is to use those 15 minutes to complete the necessary work-up to ensure our physicians have the information they need to help with your evaluation and healthcare recommendations.    Effective January 1st, 2014, we ask that you re-schedule your appointment with our physicians should you arrive 10 or more minutes late for your appointment.  We strive to give you quality time with our providers, and arriving late affects you and other patients whose appointments are after yours.    Again, thank you for choosing [facility].  Our hope is that these requests will decrease the amount of time that you wait before being seen by our physicians.       _____________________________________________________________  Should you have questions after your visit to [facility], please contact our office at [phone] between the hours of 8:30 a.m. and 5:00 p.m.  Voicemails left after 4:30 p.m. will not be returned until the following business day.  For prescription refill requests, have your pharmacy contact our office with your prescription refill request.

## 2012-08-13 NOTE — Progress Notes (Signed)
Tolerated well. No procrit today due to hemoglobin > 10.

## 2012-08-20 ENCOUNTER — Encounter (HOSPITAL_COMMUNITY): Payer: Medicare Other | Attending: Oncology

## 2012-08-20 VITALS — BP 127/86 | HR 131 | Temp 99.8°F | Resp 18 | Wt 138.2 lb

## 2012-08-20 DIAGNOSIS — Z5111 Encounter for antineoplastic chemotherapy: Secondary | ICD-10-CM

## 2012-08-20 DIAGNOSIS — C50919 Malignant neoplasm of unspecified site of unspecified female breast: Secondary | ICD-10-CM | POA: Insufficient documentation

## 2012-08-20 DIAGNOSIS — C50319 Malignant neoplasm of lower-inner quadrant of unspecified female breast: Secondary | ICD-10-CM

## 2012-08-20 DIAGNOSIS — C50911 Malignant neoplasm of unspecified site of right female breast: Secondary | ICD-10-CM

## 2012-08-20 LAB — COMPREHENSIVE METABOLIC PANEL
Albumin: 3.6 g/dL (ref 3.5–5.2)
BUN: 9 mg/dL (ref 6–23)
Calcium: 10.1 mg/dL (ref 8.4–10.5)
Chloride: 101 mEq/L (ref 96–112)
Creatinine, Ser: 0.74 mg/dL (ref 0.50–1.10)
GFR calc non Af Amer: 90 mL/min — ABNORMAL LOW (ref >90)
Total Bilirubin: 0.1 mg/dL — ABNORMAL LOW (ref 0.3–1.2)

## 2012-08-20 LAB — CBC WITH DIFFERENTIAL/PLATELET
Basophils Relative: 0 % (ref 0–1)
Eosinophils Relative: 0 % (ref 0–5)
HCT: 32.5 % — ABNORMAL LOW (ref 36.0–46.0)
Hemoglobin: 11 g/dL — ABNORMAL LOW (ref 12.0–15.0)
MCH: 34.1 pg — ABNORMAL HIGH (ref 26.0–34.0)
MCHC: 33.8 g/dL (ref 30.0–36.0)
MCV: 100.6 fL — ABNORMAL HIGH (ref 78.0–100.0)
Monocytes Absolute: 0.6 10*3/uL (ref 0.1–1.0)
Monocytes Relative: 10 % (ref 3–12)
Neutro Abs: 3.3 10*3/uL (ref 1.7–7.7)

## 2012-08-20 LAB — CANCER ANTIGEN 27.29: CA 27.29: 35 U/mL (ref 0–39)

## 2012-08-20 MED ORDER — DIPHENHYDRAMINE HCL 50 MG/ML IJ SOLN
INTRAMUSCULAR | Status: AC
  Filled 2012-08-20: qty 1

## 2012-08-20 MED ORDER — PACLITAXEL CHEMO INJECTION 300 MG/50ML
80.0000 mg/m2 | Freq: Once | INTRAVENOUS | Status: AC
  Administered 2012-08-20: 138 mg via INTRAVENOUS
  Filled 2012-08-20: qty 23

## 2012-08-20 MED ORDER — FAMOTIDINE IN NACL 20-0.9 MG/50ML-% IV SOLN
INTRAVENOUS | Status: AC
  Filled 2012-08-20: qty 50

## 2012-08-20 MED ORDER — HEPARIN SOD (PORK) LOCK FLUSH 100 UNIT/ML IV SOLN
500.0000 [IU] | Freq: Once | INTRAVENOUS | Status: DC | PRN
  Filled 2012-08-20: qty 5

## 2012-08-20 MED ORDER — SODIUM CHLORIDE 0.9 % IV SOLN
Freq: Once | INTRAVENOUS | Status: AC
  Administered 2012-08-20: 8 mg via INTRAVENOUS
  Filled 2012-08-20: qty 4

## 2012-08-20 MED ORDER — FAMOTIDINE IN NACL 20-0.9 MG/50ML-% IV SOLN
20.0000 mg | Freq: Once | INTRAVENOUS | Status: AC
  Administered 2012-08-20: 20 mg via INTRAVENOUS

## 2012-08-20 MED ORDER — SODIUM CHLORIDE 0.9 % IV SOLN
Freq: Once | INTRAVENOUS | Status: AC
  Administered 2012-08-20: 12:00:00 via INTRAVENOUS

## 2012-08-20 MED ORDER — DIPHENHYDRAMINE HCL 50 MG/ML IJ SOLN
50.0000 mg | Freq: Once | INTRAMUSCULAR | Status: AC
  Administered 2012-08-20: 50 mg via INTRAVENOUS

## 2012-08-20 NOTE — Patient Instructions (Addendum)
.  [facility] Discharge Instructions  RECOMMENDATIONS MADE BY THE CONSULTANT AND ANY TEST RESULTS WILL BE SENT TO YOUR REFERRING PHYSICIAN.    SPECIAL INSTRUCTIONS/FOLLOW-UP: Last one today!!  Thank you for choosing [facility] to provide your oncology and hematology care.  To afford each patient quality time with our providers, please arrive at least 15 minutes before your scheduled appointment time.  With your help, our goal is to use those 15 minutes to complete the necessary work-up to ensure our physicians have the information they need to help with your evaluation and healthcare recommendations.    Effective January 1st, 2014, we ask that you re-schedule your appointment with our physicians should you arrive 10 or more minutes late for your appointment.  We strive to give you quality time with our providers, and arriving late affects you and other patients whose appointments are after yours.    Again, thank you for choosing [facility].  Our hope is that these requests will decrease the amount of time that you wait before being seen by our physicians.       _____________________________________________________________  Should you have questions after your visit to [facility], please contact our office at [phone] between the hours of 8:30 a.m. and 5:00 p.m.  Voicemails left after 4:30 p.m. will not be returned until the following business day.  For prescription refill requests, have your pharmacy contact our office with your prescription refill request.

## 2012-08-20 NOTE — Progress Notes (Signed)
Tolerated well

## 2012-08-29 ENCOUNTER — Encounter (HOSPITAL_BASED_OUTPATIENT_CLINIC_OR_DEPARTMENT_OTHER): Payer: Medicare Other | Admitting: Oncology

## 2012-08-29 VITALS — BP 116/78 | HR 123 | Resp 16 | Wt 141.2 lb

## 2012-08-29 DIAGNOSIS — C50319 Malignant neoplasm of lower-inner quadrant of unspecified female breast: Secondary | ICD-10-CM

## 2012-08-29 DIAGNOSIS — Z171 Estrogen receptor negative status [ER-]: Secondary | ICD-10-CM

## 2012-08-29 DIAGNOSIS — C50911 Malignant neoplasm of unspecified site of right female breast: Secondary | ICD-10-CM

## 2012-08-29 NOTE — Progress Notes (Signed)
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 Skin involvement extended down to and below the inframammary fold medially and inferiorly and extended around the entire nipple areola complex. She was essentially triple negative but did have her just receptors 1% positivity. She was ER negative and HER-2/neu nonamplified. She is now status post 4 cycles of Adriamycin and Cytoxan followed by weekly Taxol x12 doses. She has some thickening still in the inframammary fold which is very minimal and some discoloration but no distinct mass. Dr. Malvin Johns and I are still concerned about tumor at the inframammary fold. He does not feel no July that she is a good surgical candidate for a mastectomy because of its location and therefore we think she should move on with a radiation therapy consultation with Dr. Thersa Salt.  She has no lymphadenopathy palpable in the cervical, supraclavicular, infraclavicular, or axillary areas.  Which she does have her scars on the right mid back extending around to the right flank and on the right upper abdomen consistent with old herpes zoster changes. They have healed nicely and she is absolutely asymptomatic.  We will get Dr. Thersa Salt to see her in consultation. I spoke with her and her for sisters who have today for proximal the 25 minutes about what has transpired what is to be expected and the fact that surgeries by very risky at this juncture. A nonhealing surgical scar would be very problematic.  She is okay with not having surgery as is the family. We will see her back here in 2 months and I will consider giving her Arimidex but if she has any side effects from any drug we use going forward I would not pursue it with this marginal progesterone receptor positivity.

## 2012-08-29 NOTE — Patient Instructions (Addendum)
[facility] Discharge Instructions  RECOMMENDATIONS MADE BY THE CONSULTANT AND ANY TEST RESULTS WILL BE SENT TO YOUR REFERRING PHYSICIAN.  EXAM FINDINGS BY THE PHYSICIAN TODAY AND SIGNS OR SYMPTOMS TO REPORT TO CLINIC OR PRIMARY PHYSICIAN: Exam and discussion by MD.  [name] had a great response to chemotherapy.  Because of the location of your tumor it would be very difficult for you to heal if you had surgery.  Recommendations are that you get radiation therapy.  We will make an appointment to see Dr. Thersa Salt and he will determine when it needs to be started.  You generally want to wait 3 - 4 weeks after completion of chemotherapy to start radiation.  You had shingles but the areas are healed now.  MEDICATIONS PRESCRIBED:  none  INSTRUCTIONS GIVEN AND DISCUSSED: Report any new lumps, bone pain or shortness of breath.  SPECIAL INSTRUCTIONS/FOLLOW-UP: Follow-up here in 2 months.  Thank you for choosing [facility] to provide your oncology and hematology care.  To afford each patient quality time with our providers, please arrive at least 15 minutes before your scheduled appointment time.  With your help, our goal is to use those 15 minutes to complete the necessary work-up to ensure our physicians have the information they need to help with your evaluation and healthcare recommendations.    Effective January 1st, 2014, we ask that you re-schedule your appointment with our physicians should you arrive 10 or more minutes late for your appointment.  We strive to give you quality time with our providers, and arriving late affects you and other patients whose appointments are after yours.    Again, thank you for choosing [facility].  Our hope is that these requests will decrease the amount of time that you wait before being seen by our physicians.       _____________________________________________________________  Should you have questions after your  visit to [facility], please contact our office at [phone] between the hours of 8:30 a.m. and 5:00 p.m.  Voicemails left after 4:30 p.m. will not be returned until the following business day.  For prescription refill requests, have your pharmacy contact our office with your prescription refill request.

## 2012-09-08 ENCOUNTER — Other Ambulatory Visit (HOSPITAL_COMMUNITY): Payer: Self-pay | Admitting: Oncology

## 2012-09-15 ENCOUNTER — Ambulatory Visit (HOSPITAL_COMMUNITY): Payer: Medicare Other | Admitting: Oncology

## 2012-10-27 ENCOUNTER — Encounter (HOSPITAL_COMMUNITY): Payer: Self-pay

## 2012-10-27 ENCOUNTER — Encounter (HOSPITAL_COMMUNITY): Payer: Medicare Other | Attending: Hematology and Oncology

## 2012-10-27 VITALS — BP 105/70 | HR 116 | Temp 98.7°F | Resp 16 | Wt 145.4 lb

## 2012-10-27 DIAGNOSIS — C50919 Malignant neoplasm of unspecified site of unspecified female breast: Secondary | ICD-10-CM

## 2012-10-27 DIAGNOSIS — C50911 Malignant neoplasm of unspecified site of right female breast: Secondary | ICD-10-CM

## 2012-10-27 MED ORDER — ANASTROZOLE 1 MG PO TABS: 1.0000 mg | ORAL_TABLET | Freq: Every day | ORAL | Status: DC

## 2012-10-27 MED ORDER — HEPARIN SOD (PORK) LOCK FLUSH 100 UNIT/ML IV SOLN
500.0000 [IU] | Freq: Once | INTRAVENOUS | Status: AC
  Administered 2012-10-27: 500 [IU] via INTRAVENOUS
  Filled 2012-10-27: qty 5

## 2012-10-27 MED ORDER — HEPARIN SOD (PORK) LOCK FLUSH 100 UNIT/ML IV SOLN
INTRAVENOUS | Status: AC
  Filled 2012-10-27: qty 5

## 2012-10-27 MED ORDER — SODIUM CHLORIDE 0.9 % IJ SOLN
10.0000 mL | INTRAMUSCULAR | Status: DC | PRN
  Filled 2012-10-27: qty 10

## 2012-10-27 NOTE — Progress Notes (Signed)
[facility] [phone]   [phone]  OFFICE PROGRESS NOTE  [name], MD [address] [place] [address]  DIAGNOSIS: Invasive ductal carcinoma of the right Breast. "The carcinoma is Grade III. Per outside report, estrogen receptor is 0%, progesterone receptor is 1%, Her 2 is 2+ by immunohistochemistry and negative by gene amplification with a ratio of 1.03. Ki-67 is 47%" per path report dtaed 03/24/2012.  ONCOLOGIC HISTORY:Per Dr. 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 Skin involvement extended down to and below the inframammary fold medially and inferiorly and extended around the entire nipple areola complex. She was essentially triple negative but did have her just receptors 1% positivity. She was ER negative and HER-2/neu nonamplified. She is now status post 4 cycles of Adriamycin and Cytoxan followed by weekly Taxol x12 doses. She has some thickening still in the inframammary fold which is very minimal and some discoloration but no distinct mass. Dr. Malvin Johns and I are still concerned about tumor at the inframammary fold. He does not feel no July that she is a good surgical candidate for a mastectomy because of its location and therefore we think she should move on with a radiation therapy consultation with Dr. Thersa Salt.   INTERVAL HISTORY:   [name] 62 y.o. female returns to the clinic today for  Scheduled followup.  She was seen by Dr. Thersa Salt after recent re referral by Dr Mariel Sleet when she was considered not a candidate for surgery.  At this time to call recommended definitive external beam radiation to the right breast.  She tells me she has 9 days left in radiation schedule. She is accompanied by daily of her sisters; [name] and [name]. Most of the information  today were provided  by her sister and itching there did not much.Patient is  tolerating radiation very well  without any associated nausea,vomiting or fatigue.  There is also no significant change in bowel  habits or change in shortness of breath.she also denies any significant skin reaction from the radiation.  She denies pain, normal finger or toe numbness .  MEDICAL HISTORY: Past Medical History  Diagnosis Date  . Allergic rhinitis   . Bipolar disorder   . GERD (gastroesophageal reflux disease)   . Hypopotassemia   . Hyperlipemia   . COPD (chronic obstructive pulmonary disease)   . Heart palpitations   . Anemia   . Cancer   . 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    ALLERGIES:  is allergic to ibuprofen and other.  MEDICATIONS:  Current Outpatient Prescriptions  Medication Sig Dispense Refill  . Alum & Mag Hydroxide-Simeth (MAGIC MOUTHWASH) SOLN Take 5 mLs by mouth 4 (four) times daily as needed.  300 mL  0  . docusate sodium (COLACE) 100 MG capsule Take 2 capsules (200 mg total) by mouth at bedtime.  10 capsule  0  . fluPHENAZine (PROLIXIN) 2.5 MG tablet Take 2.5 mg by mouth at bedtime.      Marland Kitchen HYDROcodone-acetaminophen (NORCO/VICODIN) 5-325 MG per tablet Take 1 tablet by mouth every 6 (six) hours as needed for pain.  30 tablet  0  . lidocaine-prilocaine (EMLA) cream Apply 1 application topically as needed. Apply a quarter sized amount to port site 1 hour prior to chemo.  Do not rub in. Cover with plastic.      Marland Kitchen loratadine (CLARITIN) 10 MG tablet Take 10 mg by mouth daily.      Marland Kitchen LORazepam (ATIVAN) 1 MG tablet Take 1 tablet (1 mg total) by mouth every 4 (four) hours as needed (Nausea or vomiting).  30 tablet  0  . loxapine (LOXITANE) 10 MG capsule Take 10 mg by mouth at bedtime.      . metFORMIN (GLUCOPHAGE) 500 MG tablet TAKE (1) TABLET TWICE DAILY.  60 tablet  1  . nortriptyline (PAMELOR) 50 MG capsule Take 100 mg by mouth at bedtime.       Marland Kitchen  omeprazole (PRILOSEC) 20 MG capsule Take 20 mg by mouth daily.      Marland Kitchen POTASSIUM CHLORIDE PO Take 20 mEq by mouth daily.      . ramelteon (ROZEREM) 8 MG tablet Take 8 mg by mouth at bedtime.      . simvastatin (ZOCOR) 40 MG tablet Take 40 mg by mouth daily.      . traZODone (DESYREL) 150 MG tablet Take 150 mg by mouth at bedtime.      Marland Kitchen anastrozole (ARIMIDEX) 1 MG tablet Take 1 tablet (1 mg total) by mouth daily.  30 tablet  11   Current Facility-Administered Medications  Medication Dose Route Frequency Provider Last Rate Last Dose  . sodium chloride 0.9 % injection 10 mL  10 mL Intravenous PRN Sherral Hammers, MD        SURGICAL HISTORY:  Past Surgical History  Procedure Laterality Date  . Sterilization    . Colonoscopy    . Upper gastrointestinal endoscopy    . Dilation and curettage of uterus    . Colonoscopy with esophagogastroduodenoscopy (egd)  02/21/2012    Procedure: COLONOSCOPY WITH ESOPHAGOGASTRODUODENOSCOPY (EGD);  Surgeon: Malissa Hippo, MD;  Location: [facility];  Service: Endoscopy;  Laterality: N/A;  2:25  . Right breast lumpectomy      benign/Dr. Lovell Sheehan ?1990's  . Breast biopsy  2013    right breast  . Portacath placement  04/01/2012    Procedure: INSERTION PORT-A-CATH;  Surgeon: Marlane Hatcher, MD;  Location: [facility] ORS;  Service: General;  Laterality: Left;  Insertion Port-A-Cath Left Subclavian under Fluoroscopy     REVIEW OF SYSTEMS: 14 point review of system is as in the history above otherwise negative.  PHYSICAL EXAMINATION:  Blood pressure 105/70, pulse 116, temperature 98.7 F (37.1 C), temperature source Oral, resp. rate 16, weight 145 lb 6.4 oz (65.953 kg). GENERAL: No distress, well nourished. Does not talk a lot. SKIN:  No rashes or significant lesions .Some alopecia within the scalp. HEAD: Normocephalic, No masses, lesions, tenderness or abnormalities  EYES: Conjunctiva are pink and non-injected  ENT: External ears normal ,lips, buccal mucosa,  and tongue normal and mucous membranes are moist .  No evidence of thrush LYMPH: No palpable lymphadenopathy in the neck or axilla or supraclavicular areas. BREAST:left breast was unremarkable.  Right breast she'll induration in the inferior aspect of the breast but no definite mass palpable, there is also some hyperpigmentation of the skin. LUNGS: clear to auscultation , no crackles or wheezes HEART: regular rate & rhythm, no murmurs, no gallops, S1 normal and S2 normal  ABDOMEN: Abdomen soft, non-tender, normal bowel sounds, no masses or organomegaly and no hepatosplenomegaly palpable. MSK: No CVA tenderness and no tenderness on percussion of the back or rib cage. EXTREMITIES: No edema, no skin discoloration or tenderness NEURO: alert &  oriented , no focal motor/sensory deficits.     LABORATORY DATA: Lab Results  Component Value Date   WBC 5.4 08/20/2012   HGB 11.0* 08/20/2012   HCT 32.5* 08/20/2012   MCV 100.6* 08/20/2012   PLT 234 08/20/2012      Chemistry      Component Value Date/Time   NA 142 08/20/2012 1037   K 4.6 08/20/2012 1037   CL 101 08/20/2012 1037   CO2 25 08/20/2012 1037   BUN 9 08/20/2012 1037   CREATININE 0.74 08/20/2012 1037      Component Value Date/Time   CALCIUM 10.1 08/20/2012 1037   ALKPHOS 83 08/20/2012 1037   AST 18 08/20/2012 1037   ALT 19 08/20/2012 1037   BILITOT 0.1* 08/20/2012 1037       RADIOGRAPHIC STUDIES: No results found.   ASSESSMENT:  Carcinoma of the right breast treated as above.  She was considered not a candidate for surgery and presently undergoing definitive external beam radiation which she seems to be tolerating well without any significant adverse reaction.  These are now 1% progesterone receptor expression the tumor current recommendation is to administer endocrine therapy for 5 years.  Had a long discussion with the patient and her family regarding this, at the end they elected to go along with this. At this point there is no clear clinical evidence  of tumor progression or active tumor, however I did explain to the patient and her family that the induration in the inferior aspect of the right breast could represent scar tissue and possibly active cancer but time will make make this distinction.  PLAN:  1. She will finish radiation as recommended. 2. I recommended that and prescribed Arimidex 1 mg daily for 5 years.  Benefits side effects explained to the patient, including but not limited to bone loss hot flashes, aches and pains. 3. We'll get a baseline DEXA scan. 4. She'll return to clinic in 1 month to see how she is doing on Arimidex.    All questions were satisfactorily answered.She knows to call if she has any concern.  I spent more than 50 % counseling the patient and her relatives face to face. The total time spent in the appointment was 40 minutes.   Sherral Hammers, MD FACP. Hematology/Oncology.

## 2012-10-27 NOTE — Patient Instructions (Addendum)
[facility] Discharge Instructions  RECOMMENDATIONS MADE BY THE CONSULTANT AND ANY TEST RESULTS WILL BE SENT TO YOUR REFERRING PHYSICIAN.  The doctor would like for you to start taking a pill called Arimidex. Medication information has been provided. You will need a Bone Density test scheduled soon. We need a baseline study because the Arimidex can potentially cause bone loss. Port flush done today. Port flush should be done every 6-8 weeks. Return to clinic in 1 month to see how you are tolerating the Arimidex. Report any issues/concerns to clinic as needed prior to appointment.  Thank you for choosing [name] to provide your oncology and hematology care.  To afford each patient quality time with our providers, please arrive at least 15 minutes before your scheduled appointment time.  With your help, our goal is to use those 15 minutes to complete the necessary work-up to ensure our physicians have the information they need to help with your evaluation and healthcare recommendations.    Effective January 1st, 2014, we ask that you re-schedule your appointment with our physicians should you arrive 10 or more minutes late for your appointment.  We strive to give you quality time with our providers, and arriving late affects you and other patients whose appointments are after yours.    Again, thank you for choosing [facility].  Our hope is that these requests will decrease the amount of time that you wait before being seen by our physicians.       _____________________________________________________________  Should you have questions after your visit to [facility], please contact our office at [phone] between the hours of 8:30 a.m. and 5:00 p.m.  Voicemails left after 4:30 p.m. will not be returned until the following business day.  For prescription refill requests, have your pharmacy contact our office with your prescription  refill request.     Anastrozole tablets What is this medicine? ANASTROZOLE (an AS troe zole) is used to treat breast cancer in women who have gone through menopause. Some types of breast cancer depend on estrogen to grow, and this medicine can stop tumor growth by blocking estrogen production. This medicine may be used for other purposes; ask your health care provider or pharmacist if you have questions. What should I tell my health care provider before I take this medicine? They need to know if you have any of these conditions: -liver disease -an unusual or allergic reaction to anastrozole, other medicines, foods, dyes, or preservatives -pregnant or trying to get pregnant -breast-feeding How should I use this medicine? Take this medicine by mouth with a glass of water. Follow the directions on the prescription label. You can take this medicine with or without food. Take your doses at regular intervals. Do not take your medicine more often than directed. Do not stop taking except on the advice of your doctor or health care professional. Talk to your pediatrician regarding the use of this medicine in children. Special care may be needed. Overdosage: If you think you have taken too much of this medicine contact a poison control center or emergency room at once. NOTE: This medicine is only for you. Do not share this medicine with others. What if I miss a dose? If you miss a dose, take it as soon as you can. If it is almost time for your next dose, take only that dose. Do not take double or extra doses. What may interact with this medicine? Do  not take this medicine with any of the following medications: -female hormones, like estrogens or progestins and birth control pills This medicine may also interact with the following medications: -tamoxifen This list may not describe all possible interactions. Give your health care provider a list of all the medicines, herbs, non-prescription drugs, or  dietary supplements you use. Also tell them if you smoke, drink alcohol, or use illegal drugs. Some items may interact with your medicine. What should I watch for while using this medicine? Visit your doctor or health care professional for regular checks on your progress. Let your doctor or health care professional know about any unusual vaginal bleeding. Do not treat yourself for diarrhea, nausea, vomiting or other side effects. Ask your doctor or health care professional for advice. What side effects may I notice from receiving this medicine? Side effects that you should report to your doctor or health care professional as soon as possible: -allergic reactions like skin rash, itching or hives, swelling of the face, lips, or tongue -any Darin Redmann or unusual symptoms -breathing problems -chest pain -leg pain or swelling -vomiting Side effects that usually do not require medical attention (report to your doctor or health care professional if they continue or are bothersome): -back or bone pain -cough, or throat infection -diarrhea or constipation -dizziness -headache -hot flashes -loss of appetite -nausea -sweating -weakness and tiredness -weight gain This list may not describe all possible side effects. Call your doctor for medical advice about side effects. You may report side effects to FDA at 1-800-FDA-1088. Where should I keep my medicine? Keep out of the reach of children. Store at room temperature between 20 and 25 degrees C (68 and 77 degrees F). Throw away any unused medicine after the expiration date. NOTE: This sheet is a summary. It may not cover all possible information. If you have questions about this medicine, talk to your doctor, pharmacist, or health care provider.  2013, [employer] Standard. (06/13/2007 4:31:52 PM)

## 2012-10-27 NOTE — Progress Notes (Signed)
[name] presented for Portacath access and flush. Proper placement of portacath confirmed by CXR. Portacath located left chest wall accessed with  H 20 needle. Good blood return present. Portacath flushed with 20ml NS and 500U/81ml Heparin and needle removed intact. Procedure without incident. Patient tolerated procedure well.

## 2012-10-29 ENCOUNTER — Emergency Department (HOSPITAL_COMMUNITY): Payer: Medicare Other

## 2012-10-29 ENCOUNTER — Encounter (HOSPITAL_COMMUNITY): Payer: Self-pay

## 2012-10-29 ENCOUNTER — Other Ambulatory Visit: Payer: Self-pay

## 2012-10-29 ENCOUNTER — Inpatient Hospital Stay (HOSPITAL_COMMUNITY)
Admission: EM | Admit: 2012-10-29 | Discharge: 2012-11-07 | DRG: 286 | Disposition: A | Discharge status: Skilled Nursing Facility | Payer: Medicare Other | Attending: Internal Medicine | Admitting: Internal Medicine

## 2012-10-29 DIAGNOSIS — Z853 Personal history of malignant neoplasm of breast: Secondary | ICD-10-CM

## 2012-10-29 DIAGNOSIS — Z836 Family history of other diseases of the respiratory system: Secondary | ICD-10-CM

## 2012-10-29 DIAGNOSIS — F319 Bipolar disorder, unspecified: Secondary | ICD-10-CM | POA: Diagnosis present

## 2012-10-29 DIAGNOSIS — T451X5A Adverse effect of antineoplastic and immunosuppressive drugs, initial encounter: Secondary | ICD-10-CM | POA: Diagnosis present

## 2012-10-29 DIAGNOSIS — J4489 Other specified chronic obstructive pulmonary disease: Secondary | ICD-10-CM | POA: Diagnosis present

## 2012-10-29 DIAGNOSIS — Z79899 Other long term (current) drug therapy: Secondary | ICD-10-CM

## 2012-10-29 DIAGNOSIS — C50911 Malignant neoplasm of unspecified site of right female breast: Secondary | ICD-10-CM

## 2012-10-29 DIAGNOSIS — Z9221 Personal history of antineoplastic chemotherapy: Secondary | ICD-10-CM

## 2012-10-29 DIAGNOSIS — Z803 Family history of malignant neoplasm of breast: Secondary | ICD-10-CM

## 2012-10-29 DIAGNOSIS — I509 Heart failure, unspecified: Secondary | ICD-10-CM

## 2012-10-29 DIAGNOSIS — K760 Fatty (change of) liver, not elsewhere classified: Secondary | ICD-10-CM

## 2012-10-29 DIAGNOSIS — I428 Other cardiomyopathies: Secondary | ICD-10-CM

## 2012-10-29 DIAGNOSIS — E785 Hyperlipidemia, unspecified: Secondary | ICD-10-CM | POA: Diagnosis present

## 2012-10-29 DIAGNOSIS — N390 Urinary tract infection, site not specified: Secondary | ICD-10-CM | POA: Diagnosis not present

## 2012-10-29 DIAGNOSIS — Z8673 Personal history of transient ischemic attack (TIA), and cerebral infarction without residual deficits: Secondary | ICD-10-CM

## 2012-10-29 DIAGNOSIS — J9600 Acute respiratory failure, unspecified whether with hypoxia or hypercapnia: Secondary | ICD-10-CM | POA: Diagnosis present

## 2012-10-29 DIAGNOSIS — I3139 Other pericardial effusion (noninflammatory): Secondary | ICD-10-CM | POA: Diagnosis present

## 2012-10-29 DIAGNOSIS — I2510 Atherosclerotic heart disease of native coronary artery without angina pectoris: Secondary | ICD-10-CM

## 2012-10-29 DIAGNOSIS — Z8249 Family history of ischemic heart disease and other diseases of the circulatory system: Secondary | ICD-10-CM

## 2012-10-29 DIAGNOSIS — F17200 Nicotine dependence, unspecified, uncomplicated: Secondary | ICD-10-CM | POA: Diagnosis present

## 2012-10-29 DIAGNOSIS — Z923 Personal history of irradiation: Secondary | ICD-10-CM

## 2012-10-29 DIAGNOSIS — J449 Chronic obstructive pulmonary disease, unspecified: Secondary | ICD-10-CM | POA: Diagnosis present

## 2012-10-29 DIAGNOSIS — E119 Type 2 diabetes mellitus without complications: Secondary | ICD-10-CM | POA: Diagnosis present

## 2012-10-29 DIAGNOSIS — Z833 Family history of diabetes mellitus: Secondary | ICD-10-CM

## 2012-10-29 DIAGNOSIS — Z8261 Family history of arthritis: Secondary | ICD-10-CM

## 2012-10-29 DIAGNOSIS — Z886 Allergy status to analgesic agent status: Secondary | ICD-10-CM

## 2012-10-29 DIAGNOSIS — I319 Disease of pericardium, unspecified: Secondary | ICD-10-CM | POA: Diagnosis present

## 2012-10-29 DIAGNOSIS — J90 Pleural effusion, not elsewhere classified: Secondary | ICD-10-CM

## 2012-10-29 DIAGNOSIS — K219 Gastro-esophageal reflux disease without esophagitis: Secondary | ICD-10-CM | POA: Diagnosis present

## 2012-10-29 DIAGNOSIS — I420 Dilated cardiomyopathy: Secondary | ICD-10-CM

## 2012-10-29 DIAGNOSIS — R570 Cardiogenic shock: Secondary | ICD-10-CM | POA: Diagnosis present

## 2012-10-29 DIAGNOSIS — Z7982 Long term (current) use of aspirin: Secondary | ICD-10-CM

## 2012-10-29 DIAGNOSIS — E876 Hypokalemia: Secondary | ICD-10-CM | POA: Diagnosis present

## 2012-10-29 DIAGNOSIS — T502X5A Adverse effect of carbonic-anhydrase inhibitors, benzothiadiazides and other diuretics, initial encounter: Secondary | ICD-10-CM | POA: Diagnosis present

## 2012-10-29 DIAGNOSIS — F209 Schizophrenia, unspecified: Secondary | ICD-10-CM | POA: Diagnosis present

## 2012-10-29 DIAGNOSIS — D638 Anemia in other chronic diseases classified elsewhere: Secondary | ICD-10-CM | POA: Diagnosis present

## 2012-10-29 DIAGNOSIS — N179 Acute kidney failure, unspecified: Secondary | ICD-10-CM | POA: Diagnosis present

## 2012-10-29 DIAGNOSIS — I5021 Acute systolic (congestive) heart failure: Secondary | ICD-10-CM

## 2012-10-29 DIAGNOSIS — I959 Hypotension, unspecified: Secondary | ICD-10-CM

## 2012-10-29 DIAGNOSIS — I5023 Acute on chronic systolic (congestive) heart failure: Principal | ICD-10-CM | POA: Diagnosis present

## 2012-10-29 DIAGNOSIS — I313 Pericardial effusion (noninflammatory): Secondary | ICD-10-CM

## 2012-10-29 HISTORY — DX: Hypokalemia: E87.6

## 2012-10-29 LAB — BASIC METABOLIC PANEL
BUN: 9 mg/dL (ref 6–23)
Calcium: 9.2 mg/dL (ref 8.4–10.5)
GFR calc Af Amer: 83 mL/min — ABNORMAL LOW (ref >90)
GFR calc non Af Amer: 71 mL/min — ABNORMAL LOW (ref >90)
Glucose, Bld: 168 mg/dL — ABNORMAL HIGH (ref 70–99)
Potassium: 4.5 mEq/L (ref 3.5–5.1)
Sodium: 134 mEq/L — ABNORMAL LOW (ref 135–145)

## 2012-10-29 LAB — CBC WITH DIFFERENTIAL/PLATELET
Basophils Relative: 0 % (ref 0–1)
Eosinophils Absolute: 0 10*3/uL (ref 0.0–0.7)
Eosinophils Relative: 0 % (ref 0–5)
Lymphs Abs: 0.4 10*3/uL — ABNORMAL LOW (ref 0.7–4.0)
MCH: 32 pg (ref 26.0–34.0)
MCHC: 32.4 g/dL (ref 30.0–36.0)
MCV: 98.7 fL (ref 78.0–100.0)
Neutrophils Relative %: 86 % — ABNORMAL HIGH (ref 43–77)
Platelets: 187 10*3/uL (ref 150–400)
RBC: 2.97 MIL/uL — ABNORMAL LOW (ref 3.87–5.11)

## 2012-10-29 LAB — GLUCOSE, CAPILLARY
Glucose-Capillary: 150 mg/dL — ABNORMAL HIGH (ref 70–99)
Glucose-Capillary: 168 mg/dL — ABNORMAL HIGH (ref 70–99)
Glucose-Capillary: 128 mg/dL — ABNORMAL HIGH (ref 70–99)

## 2012-10-29 LAB — PRO B NATRIURETIC PEPTIDE: Pro B Natriuretic peptide (BNP): 6239 pg/mL — ABNORMAL HIGH (ref 0–125)

## 2012-10-29 MED ORDER — FLUPHENAZINE HCL 2.5 MG PO TABS
2.5000 mg | ORAL_TABLET | Freq: Every day | ORAL | Status: DC
  Administered 2012-10-29 – 2012-11-06 (×9): 2.5 mg via ORAL
  Filled 2012-10-29 (×10): qty 1

## 2012-10-29 MED ORDER — IOHEXOL 350 MG/ML SOLN
100.0000 mL | Freq: Once | INTRAVENOUS | Status: AC | PRN
  Administered 2012-10-29: 100 mL via INTRAVENOUS

## 2012-10-29 MED ORDER — MILRINONE IN DEXTROSE 20 MG/100ML IV SOLN
0.2500 ug/kg/min | INTRAVENOUS | Status: DC
  Administered 2012-10-29 – 2012-10-30 (×2): 0.25 ug/kg/min via INTRAVENOUS
  Administered 2012-10-31: 0.264 ug/kg/min via INTRAVENOUS
  Filled 2012-10-29 (×3): qty 100

## 2012-10-29 MED ORDER — LOXAPINE SUCCINATE 10 MG PO CAPS
10.0000 mg | ORAL_CAPSULE | Freq: Every day | ORAL | Status: DC
  Filled 2012-10-29: qty 1

## 2012-10-29 MED ORDER — LOXAPINE SUCCINATE 10 MG PO CAPS
10.0000 mg | ORAL_CAPSULE | Freq: Every day | ORAL | Status: DC
  Administered 2012-10-30 – 2012-11-06 (×8): 10 mg via ORAL
  Filled 2012-10-29 (×9): qty 1

## 2012-10-29 MED ORDER — SODIUM CHLORIDE 0.9 % IV BOLUS (SEPSIS): 1000.0000 mL | Freq: Once | INTRAVENOUS | Status: DC

## 2012-10-29 MED ORDER — NORTRIPTYLINE HCL 25 MG PO CAPS
100.0000 mg | ORAL_CAPSULE | Freq: Every day | ORAL | Status: DC
  Administered 2012-10-29 – 2012-11-06 (×9): 100 mg via ORAL
  Filled 2012-10-29 (×10): qty 4

## 2012-10-29 MED ORDER — SODIUM CHLORIDE 0.9 % IV SOLN
INTRAVENOUS | Status: DC
  Administered 2012-10-29 – 2012-11-07 (×6): via INTRAVENOUS

## 2012-10-29 MED ORDER — FUROSEMIDE 10 MG/ML IJ SOLN
80.0000 mg | Freq: Three times a day (TID) | INTRAMUSCULAR | Status: DC
  Administered 2012-10-29 (×2): 80 mg via INTRAVENOUS
  Filled 2012-10-29 (×5): qty 8

## 2012-10-29 MED ORDER — SODIUM CHLORIDE 0.9 % IV BOLUS (SEPSIS)
500.0000 mL | Freq: Once | INTRAVENOUS | Status: AC
  Administered 2012-10-29: 10:00:00 via INTRAVENOUS

## 2012-10-29 NOTE — ED Notes (Signed)
Pt requested to ambulate to the bathroom. Pt was slighlty hypotensive at 91/63. RN aware that pt wanting to ambulate. RN, Bosie Clos, okay with pt ambulating. Pt was assisted to the bathroom. Pt became short of breath and weak while ambulating to the restroom. Upon returning to room pt was 90% O2, and heart rate was 132. Pt made aware that next time we would assist with a bedpan or a bedside commode. Family at bedside. NAD noted at this time.

## 2012-10-29 NOTE — ED Notes (Signed)
Pt given multiple warm blankets, family at bedside.

## 2012-10-29 NOTE — ED Notes (Signed)
Pt report waking from sleep around 5:30 this am sob, +cough for 2 days, no fever or chills.  No nausea or vomiting. Pt is on radiation treatments for breast cancer.

## 2012-10-29 NOTE — ED Notes (Signed)
[name] reported that pt reported increased cp before going to ct scan, dr.sheldon notified and repeat ekg ordered for when pt returns to department.

## 2012-10-29 NOTE — ED Notes (Signed)
Multiple family members at bedside assisting pt with comfort, adjusting blankets, hob etc.

## 2012-10-29 NOTE — ED Notes (Signed)
Dr.sheldon to bedside for exam. Hob elevated.

## 2012-10-29 NOTE — Care Management Note (Addendum)
    Page 1 of 2   11/07/2012     3:12:07 PM   CARE MANAGEMENT NOTE 11/07/2012  Patient:  [name], [name]   Account Number:  [id number]  Date Initiated:  10/29/2012  Documentation initiated by:  Junius Creamer  Subjective/Objective Assessment:   adm w pericardial effusion     Action/Plan:   lives w sister, pcp dr ed Juanetta Gosling   Anticipated DC Date:  11/07/2012   Anticipated DC Plan:  HOME W HOME HEALTH SERVICES  In-house referral  Clinical Social Worker      DC Associate Professor  CM consult      PAC Choice  DURABLE MEDICAL EQUIPMENT  HOME HEALTH   Choice offered to / List presented to:  C-1 Patient   DME arranged  IV PUMP/EQUIPMENT  [facility]      DME agency  OTHER - SEE NOTE  [facility]     HH arranged  HH-1 RN  HH-2 PT  HH-10 DISEASE MANAGEMENT      HH agency  OTHER - SEE NOTE  [facility]  [facility]   Status of service:   Medicare Important Message given?   (If response is "NO", the following Medicare IM given date fields will be blank) Date Medicare IM given:   Date Additional Medicare IM given:    Discharge Disposition:  SKILLED NURSING FACILITY  Per UR Regulation:  Reviewed for med. necessity/level of care/duration of stay  If discussed at Long Length of Stay Meetings, dates discussed:   11/04/2012  11/06/2012    Comments:  7/25  1442 debbie Furqan Gosselin rn,bsn fam has dcided on [facility] [place].have alerted [facility] that will not need hh iv therapy. sw making arrangemnts for snf.  7/24  1520 debbie Brailon Don rn,bsn fam decided this afternoon they would like short term snf for rehab prior to home. phy ther seeing. sw starting snf process and will have to get blue medicare approval also. will cont to follow.  7/23  1221p debbie Jonatan Wilsey rn,bsn spoke w pt and sister. gave then [facility] agency list. phy ther has rec rw and hhpt. they chose [facility] d[facility] and h[facility]pt. ref to [facility] w [facility]. will await final orders at [facility]. pt  also signed up for [facility] ([facility]) rn for followup also.  7/21  1011 debbie Brylei Pedley rn,bsn spoke w pt. she wanted [facility] but [facility] does not have Scientist, research (medical). pt wants to use [facility] for rn and iv milrinone. have sent initial paperwork for home milrinone to [facility].  7/17 1216p debbie Kamesha Herne rn,bsn spoke w pt. hx of [facility]hc. she would like inform on diabetic shoes and i located 2 local comapnies that do diabetic shoes. she knows she will need to start w dr Juanetta Gosling to get ref then get fitted for shoes. will cont to follow.

## 2012-10-29 NOTE — H&P (Signed)
PULMONARY  / CRITICAL CARE MEDICINE  Name: [name] MRN: [id number] DOB: 12/03/1950    ADMISSION DATE:  10/29/2012 CONSULTATION DATE:  10/29/2012  REFERRING MD :  [facility] PRIMARY SERVICE: PCCM  CHIEF COMPLAINT:  Shortness of breath and cough for 2 days  BRIEF PATIENT DESCRIPTION: A 62 yo female with a PMH of COPD, CVA, breast CA, and HLD who presented 7/16 with SOB, cough, and hypotension. CT showed R pleural and pericardial effusion, concern for cardiac tamponade so transferred to [facility] for further eval.   SIGNIFICANT EVENTS / STUDIES:  7/16 Admit from [facility] of chest - Negative for PE. Cardiomegaly. Small to slightly moderate sized pericardial effusion. Small to slightly moderate size right-sided pleural effusion with basilar atelectasis. Minimal left-sided pleural effusion.  Right breast mass.  No bony destructive lesion. Limited imaging of the upper abdomen reveals prominent hepatic veins which may indicate increased right heart pressure. ECHO - pending  LINES / TUBES: Peripherals  CULTURES: none  ANTIBIOTICS: none  HISTORY OF PRESENT ILLNESS:  [name] is a 62 yo female with a PMH of COPD, CVA, breast CA, and HLD who presented with SOB, cough, and hypotension. She had no complaints of sputum production, fever, or chills.  CT of chest was performed and showed R pleural and pericardial effusion, concern for cardiac tamponade.  [facility] and hospitalitis thought this may be related to her chemotherapy regimen.  She is now status post 4 cycles of Adriamycin and Cytoxan followed by weekly Taxol x12 doses.  Most recently (7/14) she was started on Arimidex 1 mg daily for 5 years and told to follow up in one month.  In the [facility], a fluid bolus was given without initial improvement, however, BP eventually did improve. Her BMP should mild hyponatremia and a BNP was elevated to 6239.  Her CBC showed mild anemia and a WBC of 10.8.  ECHO was ordered at [facility] and the preliminary read  showed an EF of 20%. She was also noted to be increasingly tachycardic, dyspneic, and hypoxic with ambulation. The patient was transferred to [facility] and PCCM was consulted for further management.  PAST MEDICAL HISTORY :  Past Medical History  Diagnosis Date  . Allergic rhinitis   . Bipolar disorder   . GERD (gastroesophageal reflux disease)   . Hypokalemia   . Hyperlipemia   . COPD (chronic obstructive pulmonary disease)   . Heart palpitations   . Anemia   . Cancer   . 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   Past Surgical History  Procedure Laterality Date  . Sterilization    . Colonoscopy    . Upper gastrointestinal endoscopy    . Dilation and curettage of uterus    . Colonoscopy with esophagogastroduodenoscopy (egd)  02/21/2012    Procedure: COLONOSCOPY WITH ESOPHAGOGASTRODUODENOSCOPY (EGD);  Surgeon: Malissa Hippo, MD;  Location: [facility]O SUITE;  Service: Endoscopy;  Laterality: N/A;  2:25  . Right breast lumpectomy      benign/Dr. Lovell Sheehan ?1990's  . Breast biopsy  2013    right breast  . Portacath placement  04/01/2012    Procedure: INSERTION PORT-A-CATH;  Surgeon: Marlane Hatcher, MD;  Location: [facility] ORS;  Service: General;  Laterality: Left;  Insertion Port-A-Cath Left Subclavian under Fluoroscopy   Prior to Admission medications   Medication Sig Start Date End Date Taking? Authorizing Provider  Alum & Mag Hydroxide-Simeth (MAGIC MOUTHWASH) SOLN Take 5 mLs by mouth 4 (  four) times daily as needed. 07/09/12  Yes Randall An, MD  anastrozole (ARIMIDEX) 1 MG tablet Take 1 mg by mouth daily. 10/27/12  Yes Sherral Hammers, MD  docusate sodium (COLACE) 100 MG capsule Take 100 mg by mouth at bedtime. 02/05/12  Yes Malissa Hippo, MD  fluPHENAZine (PROLIXIN) 2.5 MG tablet Take 2.5 mg by mouth at bedtime.   Yes Historical Provider, MD  HYDROcodone-acetaminophen (NORCO/VICODIN) 5-325 MG per tablet Take 1 tablet by mouth every 6 (six)  hours as needed for pain. 03/19/12  Yes Currie Paris, MD  lidocaine-prilocaine (EMLA) cream Apply 1 application topically as needed. Apply a quarter sized amount to port site 1 hour prior to chemo. Do not rub in. Cover with plastic.   Yes Historical Provider, MD  loxapine (LOXITANE) 10 MG capsule Take 10 mg by mouth at bedtime.   Yes Historical Provider, MD  metFORMIN (GLUCOPHAGE) 500 MG tablet Take 500 mg by mouth 2 (two) times daily with a meal.   Yes Historical Provider, MD  nortriptyline (PAMELOR) 50 MG capsule Take 100 mg by mouth at bedtime.    Yes Historical Provider, MD  potassium chloride SA (K-DUR,KLOR-CON) 20 MEQ tablet Take 20 mEq by mouth daily.   Yes Historical Provider, MD  Propylene Glycol (SYSTANE BALANCE) 0.6 % SOLN Apply 1 drop to eye 2 (two) times daily as needed (dry eyes).   Yes Historical Provider, MD  ramelteon (ROZEREM) 8 MG tablet Take 8 mg by mouth at bedtime.   Yes Historical Provider, MD  traZODone (DESYREL) 150 MG tablet Take 150 mg by mouth at bedtime.   Yes Historical Provider, MD   Allergies  Allergen Reactions  . Ibuprofen Nausea Only  . Other Other (See Comments)    Ragweed Pepper - Sneezing    FAMILY HISTORY:  Family History  Problem Relation Age of Onset  . Seizures Mother   . COPD Father   . Hypertension Sister   . COPD Sister   . Arthritis Sister   . Diabetes Brother   . Hypertension Sister   . Breast cancer Sister     89 year survivor  . Arthritis Sister   . Thyroid disease Sister   . Arthritis Sister   . Diabetes Sister   . Arthritis Sister   . Heart disease Brother   . Healthy Brother   . Healthy Brother   . Healthy Son    SOCIAL HISTORY:  reports that she has been smoking.  She has never used smokeless tobacco. She reports that she does not drink alcohol or use illicit drugs.  REVIEW OF SYSTEMS:  ROS General: Denies fever, chills, diaphoresis, appetite change. Positive for fatigue  HEENT: Denies change in vision, congestion,  sore throat, rhinorrhea, trouble swallowing, neck pain or neck stiffness.  Respiratory: Denies SOB, DOE, and wheezing. Positive for cough Cardiovascular: Denies palpitations and leg swelling. Positive for chest pain. Gastrointestinal: Denies nausea, vomiting, abdominal pain, diarrhea, constipation, blood in stool. Positive for abdominal distention. Endocrine: Denies sweats, polyuria, polydipsia.  Musculoskeletal: Denies myalgias, back pain, joint swelling, arthralgias or gait problem.  Skin: Denies pallor, rash and wounds.  Neurological: Denies dizziness, seizures, syncope, weakness, lightheadedness, numbness and headaches.  Hematological: Denies adenopathy, easy bruising, personal or family bleeding history.  Psychiatric/Behavioral: Denies mood changes, confusion, nervousness, sleep disturbance and agitation.  SUBJECTIVE: Still complains of chest discomfort and mild SOB  VITAL SIGNS: Temp:  [98.2 F (36.8 C)-98.4 F (36.9 C)] 98.4 F (36.9 C) (07/16 1339) Pulse  Rate:  [87-132] 120 (07/16 1339) Resp:  [18-31] 18 (07/16 1339) BP: (75-106)/(55-77) 102/77 mmHg (07/16 1339) SpO2:  [90 %-100 %] 100 % (07/16 1339) Weight:  [64.411 kg (142 lb)] 64.411 kg (142 lb) (07/16 0820) HEMODYNAMICS:   VENTILATOR SETTINGS:   INTAKE / OUTPUT: Intake/Output   None     PHYSICAL EXAMINATION: General:  Ill appearing female in mild respiratory distress Neuro:  Alert and oriented x 3, follows commands, MAE HEENT:  Neck supple, no masses palpable Cardiovascular: Tachycardic, regular rythym, no rubs or murmurs Lungs:  CTA bilaterally, no wheezes or rhonchi Abdomen:  Rounded, distended, soft, BS faint Musculoskeletal:  Intact, MAE Skin:  Intact, portacath accessed in left upper chest  LABS:  Recent Labs Lab 10/29/12 0904  HGB 9.5*  WBC 10.8*  PLT 187  NA 134*  K 4.5  CL 99  CO2 24  GLUCOSE 168*  BUN 9  CREATININE 0.86  CALCIUM 9.2  TROPONINI <0.30  PROBNP 6239.0*   No results found  for this basename: GLUCAP,  in the last 168 hours  CXR: No film  ASSESSMENT / PLAN:  PULMONARY A: Acute hypoxic respiratory failure in setting of pulmonary edema and ATX- initially hypoxic on RA on arrival.  Sat improved on O2 via [place] at 2L. Right Pleural Effusion - small by bedside US shows minimal fluid. Not enough for thora Hx of COPD P:   -Supplemental O2 -Maintain SpO2 greater than 92% -Pulmonary toilet  CARDIOVASCULAR A:  Pericardial effusion - via CT of chest on 7/16. Cards thinks might need pericardial window Hypotension - initially not responsive to fluid bolus, then showed improvement. Decompensated systolic heart failure  - proBNP 6239 on arrival 7/16 P:  -Cardiology consulted, may need CVTS and possible window -Hold off on further fluid resuscitation, vasopressors if hypotensive -Maintain map greater than 65 -Tele monitoring  RENAL A:  No acute issues P:   -Monitor UOP -Monitor renal fx and electrolytes  GASTROINTESTINAL A:  Abdominal distention - no fluid visible on bedside US P:   -NPO for now pending cardiology recs  HEMATOLOGIC A:   Anemia of chronic illness - had macrocytic anemia 2 mo. Prior  9.5/29.3 on admit Hx of breast ca no note of METS- status post 4 cycles of Adriamycin and Cytoxan followed by weekly Taxol x12 doses.  Now on Arimidex 1 mg daily for 5 years.  P:  -Follow cbc -SCDs for DVT Px -Will consult oncology once medically stable  INFECTIOUS A:  Mild leukocytosis - doubt infectious process, afebrile. likely stress responce P:   -Monitor temp and WBC curve  ENDOCRINE A:  DM P:   -SSI -CBG Q4H  NEUROLOGIC A:  No active issues P:   -Monitor mental status -Supportive care  TODAY'S SUMMARY: A 62 yo female with a PMH of COPD, CVA, breast CA, and HLD who presented with SOB, cough, and hypotension. CT showed R pleural and pericardial effusion, concern for cardiac tamponade.  No tamponade per [facility] note. Evaluated for possible thora  and para, but no significant fluid collection. Cardiology consulted.  I have personally obtained a history, examined the patient, evaluated laboratory and imaging results, formulated the assessment and plan and placed orders.  CRITICAL CARE: The patient is critically ill with multiple organ systems failure and requires high complexity decision making for assessment and support, frequent evaluation and titration of therapies, application of advanced monitoring technologies and extensive interpretation of multiple databases. Critical Care Time devoted to patient care services described in  this note is 40 minutes.   Alyson Reedy, M.D. Pulmonary and Critical Care Medicine [facility] Pager: [phone]  10/29/2012, 3:09 PM

## 2012-10-29 NOTE — Consult Note (Signed)
CARDIOLOGY CONSULT NOTE    Patient ID: [name] MRN: [id number] DOB/AGE: Mar 13, 1951 62 y.o.  Admit date: 10/29/2012  Primary Physician   [name], MD Primary Cardiologist   Nahjae Hoeg Reason for Consultation   SOB, cough, hypotension  HPI: [name] is a 62 y/o female with a PMHx of COPD, CVA, HLD and R. Breast CA who is currently being treated with radiation and s/p chemiotherapy. She presented to the [facility] ED this AM with SOB and associated dry cough (for 2 days) that woke her up from sleep around 5:30 AM this morning. While in the [facility] ED, her SOB was worsened with exertion and relieved by rest, also accompanied by worsening tachycardia, tachypnea, and hypotension. She denies, fever, chills, nausea, vomiting, or LE swelling. An ECHO was orderered at [facility] which showed an EF of 20%, not suggestive of tamponade, according to [facility] ED notes.  She is currently undergoing radiation and is s/p 4 cycles of Adriamycin and Cytoxan followed by weekly Taxol x12 doses. Most recently (7/14) she was started on Arimidex 1 mg daily for 5 years and told to follow up in one month.  Past Medical History  Diagnosis Date  . Allergic rhinitis   . Bipolar disorder   . GERD (gastroesophageal reflux disease)   . Hypokalemia   . Hyperlipemia   . COPD (chronic obstructive pulmonary disease)   . Heart palpitations   . Anemia   . Cancer   . 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    Past Surgical History  Procedure Laterality Date  . Sterilization    . Colonoscopy    . Upper gastrointestinal endoscopy    . Dilation and curettage of uterus    . Colonoscopy with esophagogastroduodenoscopy (egd)  02/21/2012    Procedure: COLONOSCOPY WITH ESOPHAGOGASTRODUODENOSCOPY (EGD);  Surgeon: Malissa Hippo, MD;  Location: [facility] ENDO SUITE;  Service: Endoscopy;  Laterality: N/A;  2:25  . Right breast lumpectomy      benign/Dr. Lovell Sheehan ?1990's  . Breast biopsy  2013     right breast  . Portacath placement  04/01/2012    Procedure: INSERTION PORT-A-CATH;  Surgeon: Marlane Hatcher, MD;  Location: [facility] ORS;  Service: General;  Laterality: Left;  Insertion Port-A-Cath Left Subclavian under Fluoroscopy    Allergies  Allergen Reactions  . Ibuprofen Nausea Only  . Other Other (See Comments)    Ragweed Pepper - Sneezing   I have reviewed the patient's current medications  Prior to Admission medications   Medication Sig Start Date End Date Taking? Authorizing Provider  Alum & Mag Hydroxide-Simeth (MAGIC MOUTHWASH) SOLN Take 5 mLs by mouth 4 (four) times daily as needed. 07/09/12  Yes Randall An, MD  anastrozole (ARIMIDEX) 1 MG tablet Take 1 mg by mouth daily. 10/27/12  Yes Sherral Hammers, MD  docusate sodium (COLACE) 100 MG capsule Take 100 mg by mouth at bedtime. 02/05/12  Yes Malissa Hippo, MD  fluPHENAZine (PROLIXIN) 2.5 MG tablet Take 2.5 mg by mouth at bedtime.   Yes Historical Provider, MD  HYDROcodone-acetaminophen (NORCO/VICODIN) 5-325 MG per tablet Take 1 tablet by mouth every 6 (six) hours as needed for pain. 03/19/12  Yes Currie Paris, MD  lidocaine-prilocaine (EMLA) cream Apply 1 application topically as needed. Apply a quarter sized amount to port site 1 hour prior to chemo. Do not rub in. Cover with plastic.   Yes Historical Provider, MD  loxapine (LOXITANE) 10  MG capsule Take 10 mg by mouth at bedtime.   Yes Historical Provider, MD  metFORMIN (GLUCOPHAGE) 500 MG tablet Take 500 mg by mouth 2 (two) times daily with a meal.   Yes Historical Provider, MD  nortriptyline (PAMELOR) 50 MG capsule Take 100 mg by mouth at bedtime.    Yes Historical Provider, MD  potassium chloride SA (K-DUR,KLOR-CON) 20 MEQ tablet Take 20 mEq by mouth daily.   Yes Historical Provider, MD  Propylene Glycol (SYSTANE BALANCE) 0.6 % SOLN Apply 1 drop to eye 2 (two) times daily as needed (dry eyes).   Yes Historical Provider, MD  ramelteon (ROZEREM) 8 MG tablet  Take 8 mg by mouth at bedtime.   Yes Historical Provider, MD  traZODone (DESYREL) 150 MG tablet Take 150 mg by mouth at bedtime.   Yes Historical Provider, MD     History   Social History  . Marital Status: Single    Spouse Name: N/A    Number of Children: N/A  . Years of Education: N/A   Occupational History  . Not on file.   Social History Main Topics  . Smoking status: Current Every Day Smoker -- 0.25 packs/day for 5 years  . Smokeless tobacco: Never Used     Comment: 5 cigarettes a day   . Alcohol Use: No  . Drug Use: No  . Sexually Active: Not on file   Other Topics Concern  . Not on file   Social History Narrative  . No narrative on file    Family Status  Relation Status Death Age  . Mother Deceased 39    Blood Clot  . Father Deceased 63's  . Sister Alive     Back Problems  . Brother Alive   . Sister Alive   . Sister Alive   . Sister Alive   . Brother Alive   . Brother Alive   . Brother Alive   . Son Alive    Family History  Problem Relation Age of Onset  . Seizures Mother   . COPD Father   . Hypertension Sister   . COPD Sister   . Arthritis Sister   . Diabetes Brother   . Hypertension Sister   . Breast cancer Sister     40 year survivor  . Arthritis Sister   . Thyroid disease Sister   . Arthritis Sister   . Diabetes Sister   . Arthritis Sister   . Heart disease Brother   . Healthy Brother   . Healthy Brother   . Healthy Son      ROS:   General: Positive for recent fatigue, but denies fever, chills, diaphoresis, and appetite change.  HEENT: Denies change in vision, congestion, sore throat, rhinorrhea, trouble swallowing, neck pain or neck stiffness.   Respiratory: SOB, DOE, cough, and mild chest tightness. Denies wheezing.   Cardiovascular: Complains of some mild chest pain. Denies palpitations and leg swelling.  Gastrointestinal: Denies nausea, vomiting, abdominal pain, diarrhea, constipation, blood in stool and abdominal distention.    Endocrine: Denies sweats, polyuria, polydipsia. Musculoskeletal: Denies myalgias, back pain, joint swelling, arthralgias or gait problem.  Skin: Denies pallor, rash and wounds.  Neurological: Denies dizziness, seizures, syncope, weakness, lightheadedness, numbness and headaches.  Hematological: Denies adenopathy, easy bruising, personal or family bleeding history.  Psychiatric/Behavioral: Denies mood changes, confusion, nervousness, and agitation.  Physical Exam: Blood pressure 102/77, pulse 120, temperature 98.9 F (37.2 C), temperature source Oral, resp. rate 18, height 5\' 5"  (1.651 m), weight  142 lb (64.411 kg), SpO2 100.00%.  General: Vital signs reviewed.  Patient awake, alert and oriented x3 and in mild distress secondary to her tachypnea.  Head: Normocephalic and atraumatic. Mouth: No erythema, exudates, sores, or ulcerations. Moist mucus membranes. Eyes: PERRL, EOMI, conjunctivae normal, No scleral icterus.  Neck: Supple, trachea midline, normal ROM, no masses, thyromegaly, or carotid bruit present. Cardiovascular: RRR, S1 normal, S2 normal, no murmurs, gallops, or rubs. Heart sounds slightly difficult to hear secondary to her tachypnea. Pulmonary/Chest: Air entry clear and equal bilaterally. Patient is tachypneic with crackles in the lower lung fields.  Porta-cath present in left upper chest. Abdominal: Soft, non-tender, distended, bowel sounds present, no masses, organomegaly, or guarding present.  Musculoskeletal: ROM full in extremities. Extremities: No swelling or edema,  pulses symmetric and intact bilaterally. No cyanosis or clubbing. Hematology: no cervical, inginal, or axillary adenopathy.  Neurological: Strength is normal and symmetric bilaterally, cranial nerve II-XII are grossly intact, no focal motor deficit.  Skin: Warm, dry and intact. No rashes or erythema.  Labs:   Lab Results  Component Value Date   WBC 10.8* 10/29/2012   HGB 9.5* 10/29/2012   HCT 29.3*  10/29/2012   MCV 98.7 10/29/2012   PLT 187 10/29/2012   No results found for this basename: INR,  in the last 72 hours   Recent Labs Lab 10/29/12 0904  NA 134*  K 4.5  CL 99  CO2 24  BUN 9  CREATININE 0.86  CALCIUM 9.2  GLUCOSE 168*   No results found for this basename: mg    Recent Labs  10/29/12 0904  TROPONINI <0.30   No results found for this basename: TROPIPOC,  in the last 72 hours Pro B Natriuretic peptide (BNP)  Date/Time Value Range Status  10/29/2012  9:04 AM 6239.0* 0 - 125 pg/mL Final   No results found for this basename: CHOL,  HDL,  LDLCALC,  TRIG   No results found for this basename: DDIMER   No results found for this basename: Lipase,  amylase   No results found for this basename: tsh,  t4total,  freet3,  t22free,  thyroidab   Vitamin B-12  Date/Time Value Range Status  08/11/2009  9:44 AM 517  211 - 911 pg/mL Final    Echo: 05/06/12  Study Conclusions - Left ventricle: The cavity size was normal. Wall thickness was increased increased in a pattern of mild to moderate LVH. The estimated ejection fraction was 55%. Wall motion was normal; there were no regional wall motion abnormalities. Doppler parameters are consistent with abnormal left ventricular relaxation (grade 1 diastolic dysfunction). - Mitral valve: Moderate thickening and apical tethering of the anterior leaflet. Mobility of the posterior leaflet was restricted. Mild prolapse, involving the anterior leaflet. Mild regurgitation directed posteriorly. No clear evidence of mitral stenosis. No significant change compared to previous study from 12/13. - Tricuspid valve: Mild regurgitation. - Pulmonary arteries: PA peak pressure: 31mm Hg (S). - Pericardium, extracardiac: A trivial pericardial effusion was identified posterior to the heart.   10/29/12 Showed decreased LV function w/ EF of 20%. Trivial pericardial effusion.  ECG: Sinus tachycardia at 107 bpm. Possible LAE.  Radiology:  Ct  Angio Chest Pe W/cm &/or Wo Cm  10/29/2012   *RADIOLOGY REPORT*  Clinical Data: Shortness of breath.  Cough.  History breast cancer.  Ongoing chemotherapy.  CT ANGIOGRAPHY CHEST  Technique:  Multidetector CT imaging of the chest using the standard protocol during bolus administration of intravenous contrast. Multiplanar reconstructed images  including MIPs were obtained and reviewed to evaluate the vascular anatomy.  Contrast: OMNIPAQUE IOHEXOL 350 MG/ML SOLN  Comparison: 03/27/2012 PET CT.  Findings: Respiratory motion degraded examination without medium or large size pulmonary embolus.  Evaluation of peripheral branch is limited by the motion.  Cardiomegaly.  Small to slightly moderate sized pericardial effusion.  Prominent coronary artery calcifications.  Small to slightly moderate size right-sided pleural effusion with basilar atelectasis.  Minimal left-sided pleural effusion with basilar atelectasis.  No obvious pulmonary nodule although evaluation limited by motion.  Atherosclerotic type changes of the aorta.  No aneurysmal dilation. No obvious dissection although evaluation not performed to evaluate the aorta.  Right breast mass.  No axillary, hilar or mediastinal adenopathy.  No bony destructive lesion.  Limited imaging of the upper abdomen reveals prominent hepatic veins which may indicate increased right heart pressure.  Adrenal glands not imaged.  IMPRESSION: Respiratory motion degraded examination without medium or large size pulmonary embolus.  Evaluation of peripheral branch is limited by the motion.  Cardiomegaly.  Small to slightly moderate sized pericardial effusion.  Prominent coronary artery calcifications.  Small to slightly moderate size right-sided pleural effusion with basilar atelectasis.  Minimal left-sided pleural effusion with basilar atelectasis. Question mild congestive heart failure.  Right breast mass.  No axillary, hilar or mediastinal adenopathy.  No bony destructive lesion.   Limited imaging of the upper abdomen reveals prominent hepatic veins which may indicate increased right heart pressure.   Original Report Authenticated By: Lacy Duverney, M.D.   ASSESSMENT AND PLAN:    62 y/o female with a PMHx of COPD, CVA, HLD and R. Breast CA, s/p chemotherapy w/ Adriamycin and cytoxan presents w/ worsening SOB, and cough. 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  Recent onset CHF:  Start milrinone 0.125 mg for 1 hour and increase to 0.25 mg if she tolerates. If she does not tolerate, consider starting dopamine. Start lasix 80 IV tid after she is found to tolerate milrinone. Start PICC line to follow CVP. Will follow and add appropriate therapy for cardiomyopathy as hospitalization progresses. She will need right and left heart catheterization before discharge to better assess her cardiac function and rule out CAD as a cause for her cardiomyopathy, however her diuresis and clinical improvement is the priority at this time.  Signed: Lars Masson, MD 10/29/2012 4:40 PM   I have personally seen and examined this patient with Dr. Yetta Barre. I agree with the assessment and plan as outlined above. She is admitted with SOB and found to have volume overload with CHF. Small pericardial effusion on echo reviewed here in our hospital with no signs of tamponade. New LV systolic dysfunction with dilated hypokinetic LV and RV. Unknown etiology of cardiomyopathy but she did receive Adriamycin earlier this year for her breast cancer. She is hypotensive and tachycardic. CTA chest at [facility] without evidence of PE. No signs of infection. Will treat her acute on chronic systolic CHF with IV Lasix 80 mg TID, milrinone drip. Will hold on afterload reducing agents tonight with profound hypotension. Will ask CHF team to see in am. Will need Right and Left heart cath after diuresis. We have asked for a PICC  line to be placed to follow CVP and Co-ox.   Joni Norrod 6:16 PM 10/29/2012

## 2012-10-29 NOTE — Progress Notes (Signed)
*  PRELIMINARY RESULTS* Echocardiogram 2D Echocardiogram has been performed.  Conrad [place] 10/29/2012, 12:51 PM

## 2012-10-29 NOTE — Progress Notes (Signed)
eLink Physician-Brief Progress Note Patient Name: [name] DOB: 04-26-1950 MRN: [id number]  Date of Service  10/29/2012   HPI/Events of Note   Pt with ongoing psych issues.  On home antipsychotic meds  eICU Interventions  See orders to restart Loxapine, prolixin, pamelor   Intervention Category Intermediate Interventions: Other: Med rec on psych meds  Dorcas Carrow 10/29/2012, 6:20 PM

## 2012-10-29 NOTE — ED Provider Notes (Signed)
History  This chart was scribed for Sire Poet B. Bernette Mayers, MD by Bennett Scrape, ED Scribe. This patient was seen in room [facility] and the patient's care was started at 8:16 AM.  CSN: [id number] Arrival date & time 10/29/12  0813  First MD Initiated Contact with Patient 10/29/12 [phone]     Chief Complaint  Patient presents with  . Shortness of Breath    The history is provided by the patient. No language interpreter was used.    HPI Comments: [name] is a 62 y.o. female with a h/o COPD who is currently being treated with radiation for breast CA presents to the Emergency Department complaining of gradually worsening, constant SOB that woke her up from sleep around 5:30 AM this morning.  The SOB was worse with exertion and improved with rest. She reports having an associated cough for the past 2 days but denies CP, fevers, chills, nausea, emesis and leg swelling.   Past Medical History  Diagnosis Date  . Allergic rhinitis   . Bipolar disorder   . GERD (gastroesophageal reflux disease)   . Hypopotassemia   . Hyperlipemia   . COPD (chronic obstructive pulmonary disease)   . Heart palpitations   . Anemia   . Cancer   . 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   Past Surgical History  Procedure Laterality Date  . Sterilization    . Colonoscopy    . Upper gastrointestinal endoscopy    . Dilation and curettage of uterus    . Colonoscopy with esophagogastroduodenoscopy (egd)  02/21/2012    Procedure: COLONOSCOPY WITH ESOPHAGOGASTRODUODENOSCOPY (EGD);  Surgeon: Malissa Hippo, MD;  Location: [facility];  Service: Endoscopy;  Laterality: N/A;  2:25  . Right breast lumpectomy      benign/Dr. Lovell Sheehan ?1990's  . Breast biopsy  2013    right breast  . Portacath placement  04/01/2012    Procedure: INSERTION PORT-A-CATH;  Surgeon: Marlane Hatcher, MD;  Location: [facility];  Service: General;  Laterality: Left;  Insertion Port-A-Cath Left  Subclavian under Fluoroscopy   Family History  Problem Relation Age of Onset  . Seizures Mother   . COPD Father   . Hypertension Sister   . COPD Sister   . Arthritis Sister   . Diabetes Brother   . Hypertension Sister   . Breast cancer Sister     76 year survivor  . Arthritis Sister   . Thyroid disease Sister   . Arthritis Sister   . Diabetes Sister   . Arthritis Sister   . Heart disease Brother   . Healthy Brother   . Healthy Brother   . Healthy Son    History  Substance Use Topics  . Smoking status: Current Every Day Smoker -- 0.25 packs/day for 5 years  . Smokeless tobacco: Never Used     Comment: 5 cigarettes a day   . Alcohol Use: No   No OB history provided.   Review of Systems  A complete 10 system review of systems was obtained and all systems are negative except as noted in the HPI and PMH.   Allergies  Ibuprofen and Other  Home Medications   Current Outpatient Rx  Name  Route  Sig  Dispense  Refill  . Alum & Mag Hydroxide-Simeth (MAGIC MOUTHWASH) SOLN   Oral   Take 5 mLs by mouth 4 (four) times daily as needed.   300 mL   0  Requests delivery   . anastrozole (ARIMIDEX) 1 MG tablet   Oral   Take 1 tablet (1 mg total) by mouth daily.   30 tablet   11   . docusate sodium (COLACE) 100 MG capsule   Oral   Take 2 capsules (200 mg total) by mouth at bedtime.   10 capsule   0   . fluPHENAZine (PROLIXIN) 2.5 MG tablet   Oral   Take 2.5 mg by mouth at bedtime.         Marland Kitchen HYDROcodone-acetaminophen (NORCO/VICODIN) 5-325 MG per tablet   Oral   Take 1 tablet by mouth every 6 (six) hours as needed for pain.   30 tablet   0   . lidocaine-prilocaine (EMLA) cream   Topical   Apply 1 application topically as needed. Apply a quarter sized amount to port site 1 hour prior to chemo. Do not rub in. Cover with plastic.         Marland Kitchen loratadine (CLARITIN) 10 MG tablet   Oral   Take 10 mg by mouth daily.         Marland Kitchen LORazepam (ATIVAN) 1 MG tablet    Oral   Take 1 tablet (1 mg total) by mouth every 4 (four) hours as needed (Nausea or vomiting).   30 tablet   0   . loxapine (LOXITANE) 10 MG capsule   Oral   Take 10 mg by mouth at bedtime.         . metFORMIN (GLUCOPHAGE) 500 MG tablet      TAKE (1) TABLET TWICE DAILY.   60 tablet   1   . nortriptyline (PAMELOR) 50 MG capsule   Oral   Take 100 mg by mouth at bedtime.          Marland Kitchen omeprazole (PRILOSEC) 20 MG capsule   Oral   Take 20 mg by mouth daily.         Marland Kitchen POTASSIUM CHLORIDE PO   Oral   Take 20 mEq by mouth daily.         . ramelteon (ROZEREM) 8 MG tablet   Oral   Take 8 mg by mouth at bedtime.         . simvastatin (ZOCOR) 40 MG tablet   Oral   Take 40 mg by mouth daily.         . traZODone (DESYREL) 150 MG tablet   Oral   Take 150 mg by mouth at bedtime.          Triage Vitals: BP 87/58  Pulse 116  Temp(Src) 98.2 F (36.8 C) (Oral)  Resp 20  Ht 5\' 5"  (1.651 m)  Wt 142 lb (64.411 kg)  BMI 23.63 kg/m2  SpO2 97%  Physical Exam  Nursing note and vitals reviewed. Constitutional: She is oriented to person, place, and time. She appears well-developed and well-nourished.  HENT:  Head: Normocephalic and atraumatic.  Eyes: EOM are normal. Pupils are equal, round, and reactive to light.  Neck: Normal range of motion. Neck supple.  Cardiovascular: Normal rate, regular rhythm, normal heart sounds and intact distal pulses.   Pulmonary/Chest: Effort normal and breath sounds normal.  Abdominal: Bowel sounds are normal. She exhibits no distension. There is no tenderness.  Musculoskeletal: Normal range of motion. She exhibits no edema and no tenderness.  Neurological: She is alert and oriented to person, place, and time. She has normal strength. No cranial nerve deficit or sensory deficit.  Skin: Skin is warm and dry.  No rash noted.  Psychiatric: She has a normal mood and affect.    ED Course  Procedures (including critical care time)  CRITICAL  CARE Performed by: Pollyann Savoy. Total critical care time: 75 Critical care time was exclusive of separately billable procedures and treating other patients. Critical care was necessary to treat or prevent imminent or life-threatening deterioration. Critical care was time spent personally by me on the following activities: development of treatment plan with patient and/or surrogate as well as nursing, discussions with consultants, evaluation of patient's response to treatment, examination of patient, obtaining history from patient or surrogate, ordering and performing treatments and interventions, ordering and review of laboratory studies, ordering and review of radiographic studies, pulse oximetry and re-evaluation of patient's condition.  DIAGNOSTIC STUDIES: Oxygen Saturation is 97% on [place], normal by my interpretation.    COORDINATION OF CARE: 8:21 AM-Discussed treatment plan with pt at bedside and pt agreed to plan.   10:20 AM-Pt informed of CT scan results and need for admission. Pt is agreeable. Will have nurse manually rechecked BP. BP on monitor was 75/55. HR 110.  Labs Reviewed  CBC WITH DIFFERENTIAL - Abnormal; Notable for the following:    WBC 10.8 (*)    RBC 2.97 (*)    Hemoglobin 9.5 (*)    HCT 29.3 (*)    Neutrophils Relative % 86 (*)    Neutro Abs 9.3 (*)    Lymphocytes Relative 4 (*)    Lymphs Abs 0.4 (*)    Monocytes Absolute 1.1 (*)    All other components within normal limits  BASIC METABOLIC PANEL - Abnormal; Notable for the following:    Sodium 134 (*)    Glucose, Bld 168 (*)    GFR calc non Af Amer 71 (*)    GFR calc Af Amer 83 (*)    All other components within normal limits  PRO B NATRIURETIC PEPTIDE - Abnormal; Notable for the following:    Pro B Natriuretic peptide (BNP) 6239.0 (*)    All other components within normal limits  TROPONIN I   Ct Angio Chest Pe W/cm &/or Wo Cm  10/29/2012   *RADIOLOGY REPORT*  Clinical Data: Shortness of breath.  Cough.   History breast cancer.  Ongoing chemotherapy.  CT ANGIOGRAPHY CHEST  Technique:  Multidetector CT imaging of the chest using the standard protocol during bolus administration of intravenous contrast. Multiplanar reconstructed images including MIPs were obtained and reviewed to evaluate the vascular anatomy.  Contrast: OMNIPAQUE IOHEXOL 350 MG/ML SOLN  Comparison: 03/27/2012 PET CT.  Findings: Respiratory motion degraded examination without medium or large size pulmonary embolus.  Evaluation of peripheral branch is limited by the motion.  Cardiomegaly.  Small to slightly moderate sized pericardial effusion.  Prominent coronary artery calcifications.  Small to slightly moderate size right-sided pleural effusion with basilar atelectasis.  Minimal left-sided pleural effusion with basilar atelectasis.  No obvious pulmonary nodule although evaluation limited by motion.  Atherosclerotic type changes of the aorta.  No aneurysmal dilation. No obvious dissection although evaluation not performed to evaluate the aorta.  Right breast mass.  No axillary, hilar or mediastinal adenopathy.  No bony destructive lesion.  Limited imaging of the upper abdomen reveals prominent hepatic veins which may indicate increased right heart pressure.  Adrenal glands not imaged.  IMPRESSION: Respiratory motion degraded examination without medium or large size pulmonary embolus.  Evaluation of peripheral branch is limited by the motion.  Cardiomegaly.  Small to slightly moderate sized pericardial effusion.  Prominent coronary artery calcifications.  Small to slightly moderate size right-sided pleural effusion with basilar atelectasis.  Minimal left-sided pleural effusion with basilar atelectasis. Question mild congestive heart failure.  Right breast mass.  No axillary, hilar or mediastinal adenopathy.  No bony destructive lesion.  Limited imaging of the upper abdomen reveals prominent hepatic veins which may indicate increased right heart  pressure.   Original Report Authenticated By: Lacy Duverney, M.D.    No diagnosis found.  MDM   Date: 10/29/2012  Rate: 113  Rhythm: sinus tachycardia  QRS Axis: left  Intervals: normal  ST/T Wave abnormalities: normal  Conduction Disutrbances:none  Narrative Interpretation:   Old EKG Reviewed: unchanged  CTA as above neg for PE, but shows pericardia and pleural effusion. 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 Will need to be followed closely for hydration, cannot diurese now. Consider developing tamponade but no rub and no significant JVD. Spoke with Dr. Sherrie Mustache (Hospitalist) and Dr. Juanetta Gosling (PCP) and both feel that the patient would be better cared for at [facility]. Pt and family amenable to transfer. Will discuss with on-call Hospitalist at [facility].   11:26 AM Pt still hypotensive, will give one additional fluid bolus, then consider pressors. Spoke with Dr. Delton Coombes with PCCM who will accept the patient for transfer to [facility]. Will get stat Echocardiogram done prior to transfer as well.   1:25 PM Echocardiogram has been done, awaiting report. Dr. Dietrich Pates has been contacted and reviewed the echo. He reports that there is no significant effusion and no tamponade but she has EF of approx 20%. BP is improved with additional IVF.   I personally performed the services described in this documentation, which was scribed in my presence. The recorded information has been reviewed and is accurate.          Cresencia Asmus B. Bernette Mayers, MD 10/29/12 1335

## 2012-10-30 ENCOUNTER — Inpatient Hospital Stay (HOSPITAL_COMMUNITY): Payer: Medicare Other

## 2012-10-30 DIAGNOSIS — C50919 Malignant neoplasm of unspecified site of unspecified female breast: Secondary | ICD-10-CM

## 2012-10-30 DIAGNOSIS — I5021 Acute systolic (congestive) heart failure: Secondary | ICD-10-CM

## 2012-10-30 LAB — URINALYSIS, ROUTINE W REFLEX MICROSCOPIC
Glucose, UA: NEGATIVE mg/dL
Ketones, ur: NEGATIVE mg/dL
Protein, ur: 30 mg/dL — AB
Urobilinogen, UA: 0.2 mg/dL (ref 0.0–1.0)

## 2012-10-30 LAB — BASIC METABOLIC PANEL
BUN: 15 mg/dL (ref 6–23)
CO2: 27 mEq/L (ref 19–32)
Chloride: 99 mEq/L (ref 96–112)
Creatinine, Ser: 1.13 mg/dL — ABNORMAL HIGH (ref 0.50–1.10)
GFR calc Af Amer: 60 mL/min — ABNORMAL LOW (ref >90)
Glucose, Bld: 137 mg/dL — ABNORMAL HIGH (ref 70–99)
Potassium: 3.4 mEq/L — ABNORMAL LOW (ref 3.5–5.1)

## 2012-10-30 LAB — CBC
HCT: 28.5 % — ABNORMAL LOW (ref 36.0–46.0)
Hemoglobin: 9.6 g/dL — ABNORMAL LOW (ref 12.0–15.0)
MCV: 96 fL (ref 78.0–100.0)
Platelets: 212 10*3/uL (ref 150–400)
RDW: 15.5 % (ref 11.5–15.5)
RDW: 15.5 % (ref 11.5–15.5)
WBC: 8.6 10*3/uL (ref 4.0–10.5)
WBC: 9.1 10*3/uL (ref 4.0–10.5)

## 2012-10-30 LAB — URINE MICROSCOPIC-ADD ON

## 2012-10-30 LAB — GLUCOSE, CAPILLARY
Glucose-Capillary: 137 mg/dL — ABNORMAL HIGH (ref 70–99)
Glucose-Capillary: 149 mg/dL — ABNORMAL HIGH (ref 70–99)
Glucose-Capillary: 129 mg/dL — ABNORMAL HIGH (ref 70–99)
Glucose-Capillary: 170 mg/dL — ABNORMAL HIGH (ref 70–99)

## 2012-10-30 LAB — PROCALCITONIN: Procalcitonin: 0.85 ng/mL

## 2012-10-30 MED ORDER — MAGNESIUM SULFATE 40 MG/ML IJ SOLN
4.0000 g | Freq: Once | INTRAMUSCULAR | Status: AC
  Administered 2012-10-30: 4 g via INTRAVENOUS
  Filled 2012-10-30: qty 100

## 2012-10-30 MED ORDER — SODIUM CHLORIDE 0.9 % IJ SOLN: 10.0000 mL | INTRAMUSCULAR | Status: DC | PRN

## 2012-10-30 MED ORDER — ASPIRIN 81 MG PO CHEW
324.0000 mg | CHEWABLE_TABLET | ORAL | Status: AC
  Administered 2012-10-31: 324 mg via ORAL
  Filled 2012-10-30: qty 4

## 2012-10-30 MED ORDER — POTASSIUM CHLORIDE CRYS ER 20 MEQ PO TBCR
40.0000 meq | EXTENDED_RELEASE_TABLET | Freq: Every day | ORAL | Status: DC
  Administered 2012-10-30: 40 meq via ORAL
  Filled 2012-10-30: qty 1
  Filled 2012-10-30 (×2): qty 2

## 2012-10-30 MED ORDER — SODIUM CHLORIDE 0.9 % IJ SOLN: 3.0000 mL | INTRAMUSCULAR | Status: DC | PRN

## 2012-10-30 MED ORDER — FUROSEMIDE 10 MG/ML IJ SOLN
40.0000 mg | Freq: Three times a day (TID) | INTRAMUSCULAR | Status: DC
  Administered 2012-10-30 – 2012-11-01 (×7): 40 mg via INTRAVENOUS
  Filled 2012-10-30 (×9): qty 4

## 2012-10-30 MED ORDER — SODIUM CHLORIDE 0.9 % IJ SOLN: 3.0000 mL | Freq: Two times a day (BID) | INTRAMUSCULAR | Status: DC

## 2012-10-30 MED ORDER — SODIUM CHLORIDE 0.9 % IV SOLN: 250.0000 mL | INTRAVENOUS | Status: DC | PRN

## 2012-10-30 MED ORDER — SODIUM CHLORIDE 0.9 % IJ SOLN
10.0000 mL | Freq: Two times a day (BID) | INTRAMUSCULAR | Status: DC
  Administered 2012-10-30 – 2012-11-02 (×7): 10 mL via INTRAVENOUS

## 2012-10-30 NOTE — Consult Note (Signed)
Advanced Heart Failure Team Consult Note  Referring Physician: Dr. Clifton James Primary Physician: Dr. Kari Baars Oncologist: Dr. 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   She presented to [facility] ED yesterday am with SOB with a dry cough. She did not complain of any fevers, chills, n/v or edema. When she presented to the ED she had tachycardia, tachypnea and hypotension. She had a CT of the chest that r/o PE, but showed pericardial and pleural effusion.   ECHO: Reviewed echo from [facility].  Showed EF 20% with global hypokinesis, moderate RV dysfunction, trivial pericardial effusion.   She was transferred to [facility] for further management. Started on milrinone 0.25 mcg last night along with lasix 80 mg IV q 8 hrs. Weight down 3 lbs and 24 hr I/O -2.8 liters. Overnight SBP 90-100s. Mild grade temp and procalcitionin 0.8.  Denies SOB, orthopnea or CP.  Review of Systems: [y] = yes, [ ]  = no   General: Weight gain [Y ]; Weight loss [ ] ; Anorexia [ ] ; Fatigue [ ] ; Fever [N ]; Chills Klaus.Mock ]; Weakness [ ]   Cardiac: Chest pain/pressure [ ] ; Resting SOB [ ] ; Exertional SOB [ Y]; Myer Peer ]; Pedal Edema Klaus.Mock ]; Palpitations [ ] ; Syncope [ ] ; Presyncope [ ] ; Paroxysmal nocturnal dyspnea[ ]   Pulmonary: Cough Gilian.Kraft ]; Wheezing[ ] ; Hemoptysis[ ] ; Sputum [ ] ; Snoring [ ]   GI: Vomiting[ ] ; Dysphagia[ ] ; Melena[ ] ; Hematochezia [ ] ; Heartburn[ ] ; Abdominal pain [ ] ; Constipation [ ] ; Diarrhea [ ] ; BRBPR [ ]   GU: Hematuria[ ] ; Dysuria [ ] ; Nocturia[ ]   Vascular: Pain in legs with walking [ ] ; Pain in feet with lying flat [ ] ; Non-healing sores [ ] ; Stroke [ ] ; TIA [ ] ; Slurred speech [ ] ;  Neuro: Headaches[ ] ; Vertigo[ ] ; Seizures[ ] ; Paresthesias[ ] ;Blurred vision [ ] ; Diplopia [ ] ; Vision  changes [ ]   Ortho/Skin: Arthritis [Y ]; Joint pain [ ] ; Muscle pain [ ] ; Joint swelling [ ] ; Back Pain [ ] ; Rash [ ]   Psych: Depression[ ] ; Anxiety[ ]  schizophrenia [Y] Heme: Bleeding problems [ ] ; Clotting disorders [ ] ; Anemia [ ]   Endocrine: Diabetes [ Y]; Thyroid dysfunction[ ]   Home Medications Prior to Admission medications   Medication Sig Start Date End Date Taking? Authorizing Provider  Alum & Mag Hydroxide-Simeth (MAGIC MOUTHWASH) SOLN Take 5 mLs by mouth 4 (four) times daily as needed. 07/09/12  Yes Randall An, MD  anastrozole (ARIMIDEX) 1 MG tablet Take 1 mg by mouth daily. 10/27/12  Yes Sherral Hammers, MD  docusate sodium (COLACE) 100 MG capsule Take 100 mg by mouth at bedtime. 02/05/12  Yes Malissa Hippo, MD  fluPHENAZine (PROLIXIN) 2.5 MG tablet Take 2.5 mg by mouth at bedtime.   Yes Historical Provider, MD  HYDROcodone-acetaminophen (NORCO/VICODIN) 5-325 MG per tablet Take 1 tablet by mouth every 6 (six) hours as needed for pain. 03/19/12  Yes Currie Paris, MD  lidocaine-prilocaine (EMLA) cream Apply 1 application topically as needed. Apply a quarter sized amount to port site 1 hour prior to chemo. Do not rub in. Cover with plastic.   Yes Historical Provider, MD  loxapine (LOXITANE) 10 MG capsule Take 10 mg by mouth at bedtime.   Yes Historical Provider,  MD  metFORMIN (GLUCOPHAGE) 500 MG tablet Take 500 mg by mouth 2 (two) times daily with a meal.   Yes Historical Provider, MD  nortriptyline (PAMELOR) 50 MG capsule Take 100 mg by mouth at bedtime.    Yes Historical Provider, MD  potassium chloride SA (K-DUR,KLOR-CON) 20 MEQ tablet Take 20 mEq by mouth daily.   Yes Historical Provider, MD  Propylene Glycol (SYSTANE BALANCE) 0.6 % SOLN Apply 1 drop to eye 2 (two) times daily as needed (dry eyes).   Yes Historical Provider, MD  ramelteon (ROZEREM) 8 MG tablet Take 8 mg by mouth at bedtime.   Yes Historical Provider, MD  traZODone (DESYREL) 150 MG tablet Take 150 mg by  mouth at bedtime.   Yes Historical Provider, MD    Past Medical History: Past Medical History  Diagnosis Date  . Allergic rhinitis   . Bipolar disorder   . GERD (gastroesophageal reflux disease)   . Hypokalemia   . Hyperlipemia   . COPD (chronic obstructive pulmonary disease)   . Heart palpitations   . Anemia   . Cancer   . 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    Past Surgical History: Past Surgical History  Procedure Laterality Date  . Sterilization    . Colonoscopy    . Upper gastrointestinal endoscopy    . Dilation and curettage of uterus    . Colonoscopy with esophagogastroduodenoscopy (egd)  02/21/2012    Procedure: COLONOSCOPY WITH ESOPHAGOGASTRODUODENOSCOPY (EGD);  Surgeon: Malissa Hippo, MD;  Location: [facility] ENDO SUITE;  Service: Endoscopy;  Laterality: N/A;  2:25  . Right breast lumpectomy      benign/Dr. Lovell Sheehan ?1990's  . Breast biopsy  2013    right breast  . Portacath placement  04/01/2012    Procedure: INSERTION PORT-A-CATH;  Surgeon: Marlane Hatcher, MD;  Location: [facility] ORS;  Service: General;  Laterality: Left;  Insertion Port-A-Cath Left Subclavian under Fluoroscopy    Family History: Family History  Problem Relation Age of Onset  . Seizures Mother   . COPD Father   . Hypertension Sister   . COPD Sister   . Arthritis Sister   . Diabetes Brother   . Hypertension Sister   . Breast cancer Sister     32 year survivor  . Arthritis Sister   . Thyroid disease Sister   . Arthritis Sister   . Diabetes Sister   . Arthritis Sister   . Heart disease Brother   . Healthy Brother   . Healthy Brother   . Healthy Son     Social History: History   Social History  . Marital Status: Single    Spouse Name: N/A    Number of Children: N/A  . Years of Education: N/A   Social History Main Topics  . Smoking status: Current Every Day Smoker -- 0.25 packs/day for 5 years  . Smokeless tobacco: Never Used     Comment: 5  cigarettes a day   . Alcohol Use: No  . Drug Use: No  . Sexually Active: None   Other Topics Concern  . None   Social History Narrative  . None    Allergies:  Allergies  Allergen Reactions  . Ibuprofen Nausea Only  . Other Other (See Comments)    Ragweed Pepper - Sneezing    Objective:    Vital Signs:   Temp:  [98 F (36.7 C)-99.1 F (37.3 C)] 99.1 F (37.3 C) (07/17 0400)  Pulse Rate:  [87-132] 121 (07/17 0700) Resp:  [18-38] 30 (07/17 0700) BP: (75-145)/(55-77) 101/63 mmHg (07/17 0700) SpO2:  [90 %-100 %] 95 % (07/17 0700) Weight:  [140 lb 6.9 oz (63.7 kg)-143 lb 8.3 oz (65.1 kg)] 140 lb 6.9 oz (63.7 kg) (07/17 0500)    Weight change: Filed Weights   10/29/12 0820 10/29/12 1500 10/30/12 0500  Weight: 142 lb (64.411 kg) 143 lb 8.3 oz (65.1 kg) 140 lb 6.9 oz (63.7 kg)    Intake/Output:   Intake/Output Summary (Last 24 hours) at 10/30/12 0741 Last data filed at 10/30/12 0600  Gross per 24 hour  Intake 685.28 ml  Output   3545 ml  Net -2859.72 ml     Physical Exam: General:  Fatigues appearing. No resp difficulty HEENT: normal Neck: supple. JVP 14. Carotids 2+ bilat; no bruits. No lymphadenopathy or thryomegaly appreciated. Cor: PMI nondisplaced. Mildly tachycardic, regular rate & regular rhythm. No rubs, gallops or murmurs. Lungs: clear Abdomen: soft, nontender, mild distended. No hepatosplenomegaly. No bruits or masses. Good bowel sounds. Extremities: no cyanosis, clubbing, rash, edema Neuro: alert & orientedx3, cranial nerves grossly intact. moves all 4 extremities w/o difficulty. Affect pleasant  Telemetry: ST 120s  Labs: Basic Metabolic Panel:  Recent Labs Lab 10/29/12 0904 10/30/12 0430  NA 134* 137  K 4.5 3.4*  CL 99 99  CO2 24 27  GLUCOSE 168* 137*  BUN 9 15  CREATININE 0.86 1.13*  CALCIUM 9.2 9.0  MG  --  1.1*    Liver Function Tests: No results found for this basename: AST, ALT, ALKPHOS, BILITOT, PROT, ALBUMIN,  in the last 168  hours No results found for this basename: LIPASE, AMYLASE,  in the last 168 hours No results found for this basename: AMMONIA,  in the last 168 hours  CBC:  Recent Labs Lab 10/29/12 0904 10/29/12 2345 10/30/12 0430  WBC 10.8* 8.6 9.1  NEUTROABS 9.3*  --   --   HGB 9.5* 9.7* 9.6*  HCT 29.3* 28.1* 28.5*  MCV 98.7 95.6 96.0  PLT 187 212 210    Cardiac Enzymes:  Recent Labs Lab 10/29/12 0904  TROPONINI <0.30    BNP: BNP (last 3 results)  Recent Labs  10/29/12 0904  PROBNP 6239.0*    CBG:  Recent Labs Lab 10/29/12 1612 10/29/12 2020 10/29/12 2316 10/30/12 0349  GLUCAP 150* 168* 128* 137*    Coagulation Studies: No results found for this basename: LABPROT, INR,  in the last 72 hours  Other results: EKG: sinus tachy at 107, poor anterior R wave progression, rSR' V1.   Imaging: Ct Angio Chest Pe W/cm &/or Wo Cm  10/29/2012   *RADIOLOGY REPORT*  Clinical Data: Shortness of breath.  Cough.  History breast cancer.  Ongoing chemotherapy.  CT ANGIOGRAPHY CHEST  Technique:  Multidetector CT imaging of the chest using the standard protocol during bolus administration of intravenous contrast. Multiplanar reconstructed images including MIPs were obtained and reviewed to evaluate the vascular anatomy.  Contrast: OMNIPAQUE IOHEXOL 350 MG/ML SOLN  Comparison: 03/27/2012 PET CT.  Findings: Respiratory motion degraded examination without medium or large size pulmonary embolus.  Evaluation of peripheral branch is limited by the motion.  Cardiomegaly.  Small to slightly moderate sized pericardial effusion.  Prominent coronary artery calcifications.  Small to slightly moderate size right-sided pleural effusion with basilar atelectasis.  Minimal left-sided pleural effusion with basilar atelectasis.  No obvious pulmonary nodule although evaluation limited by motion.  Atherosclerotic type changes of the aorta.  No aneurysmal dilation. No obvious dissection although evaluation not  performed to evaluate the aorta.  Right breast mass.  No axillary, hilar or mediastinal adenopathy.  No bony destructive lesion.  Limited imaging of the upper abdomen reveals prominent hepatic veins which may indicate increased right heart pressure.  Adrenal glands not imaged.  IMPRESSION: Respiratory motion degraded examination without medium or large size pulmonary embolus.  Evaluation of peripheral branch is limited by the motion.  Cardiomegaly.  Small to slightly moderate sized pericardial effusion.  Prominent coronary artery calcifications.  Small to slightly moderate size right-sided pleural effusion with basilar atelectasis.  Minimal left-sided pleural effusion with basilar atelectasis. Question mild congestive heart failure.  Right breast mass.  No axillary, hilar or mediastinal adenopathy.  No bony destructive lesion.  Limited imaging of the upper abdomen reveals prominent hepatic veins which may indicate increased right heart pressure.   Original Report Authenticated By: Lacy Duverney, M.D.      Medications:     Current Medications: . fluPHENAZine  2.5 mg Oral QHS  . furosemide  80 mg Intravenous TID  . loxapine  10 mg Oral QHS  . nortriptyline  100 mg Oral QHS     Infusions: . sodium chloride 20 mL/hr at 10/29/12 2334  . milrinone 0.25 mcg/kg/min (10/29/12 1649)      Assessment:   1) A/C systolic HF, EF 20%  -likely d/t adriamycin toxicity  2) Breast CA 3) DM2 4) hypokalemia 5) hypomagnesia 5) Mild leukocytosis 6) Schizophrenia  Plan/Discussion:    1) A/C systolic HF      -likely d/t adriamycin toxicity. Will continue IV milrinone at this time. If SBP drops below 90 consistently may need to switch to dobutamine. She is markedly volume overload will give 40 mg IV lasix Q 8 hrs. K+ low will supplement. Will go to IR today for placement of PICC to guide management. Will need R/LHC, will do tomorrow morning. No b-blocker at this time due to shock/hypotension and will hold  off on ACE-I.  2) Mild leukocytosis- WBC only slightly elevated and procalcitonin 0.8. Will continue to follow and will get a UA and culture.  3) Hypomagnesia- 1.1 will give 4 gm today and follow  Will transfer to step-down.  Length of Stay: 1  Aundria Rud 10/30/2012, 7:41 AM  Advanced Heart Failure Team Pager [phone] (M-F; 7a - 4p)  Please contact [facility] for night-coverage after hours (4p -7a ) and weekends on [email or web address]  Patient seen with NP.  All aspects of consult discussed and agree with the above note.  Prior echo in 1/14 showed EF 55%.  Now EF 20%.  No history of chest pain or symptomatic CAD but she does have coronary calcification on CTA chest done yesterday.  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  She feels better on milrinone and has diuresed well overnight with slight rise in creatinine.  I will decrease Lasix to 40 mg IV every 8 hrs (has not been on Lasix at home prior) and BP is running on the low side.  SBP soft but has been 90s and above.  If falls below 90 consistently, will transition to dobutamine.  Check UA as above.  Plan for [facility] tomorrow morning if remains stable.   Marca Ancona 10/30/2012 9:34 AM

## 2012-10-30 NOTE — Procedures (Signed)
RUE PICC 37 cm SVC RA No comp

## 2012-10-30 NOTE — Progress Notes (Addendum)
PULMONARY  / CRITICAL CARE MEDICINE  Name: [name] MRN: [id number] DOB: [birth year]    ADMISSION DATE:  10/29/2012  REFERRING MD : Arman Bogus  CHIEF COMPLAINT: Short of breath  BRIEF PATIENT DESCRIPTION:  62 yo female smoker with dyspnea, cough, and hypotension.  Found to have pleural and pericardial effusion >> transfer to [facility] for further assessment.  SIGNIFICANT EVENTS: 7/16 Transfer to [facility], cardiology consulted, started milrinone  STUDIES:  7/16 CT chest >> no PE, CM, coronary calcification, small to mod Rt pleural effusion, Rt breast mass, small to mod pleural effusion  LINES / TUBES: Port  CULTURES: Urine 7/17 >>   ANTIBIOTICS: None  SUBJECTIVE:  Breathing better.  Denies chest pain or nausea.  VITAL SIGNS: Temp:  [98 F (36.7 C)-99.2 F (37.3 C)] 99.2 F (37.3 C) (07/17 0745) Pulse Rate:  [57-132] 57 (07/17 0823) Resp:  [18-38] 20 (07/17 0823) BP: (73-145)/(36-77) 92/51 mmHg (07/17 0823) SpO2:  [90 %-100 %] 95 % (07/17 0823) Weight:  [140 lb 6.9 oz (63.7 kg)-143 lb 8.3 oz (65.1 kg)] 140 lb 6.9 oz (63.7 kg) (07/17 0500) 6 liters [place]  INTAKE / OUTPUT: Intake/Output     07/16 0701 - 07/17 0700 07/17 0701 - 07/18 0700   P.O. 360    I.V. (mL/kg) 325.3 (5.1) 43.4 (0.7)   Total Intake(mL/kg) 685.3 (10.8) 43.4 (0.7)   Urine (mL/kg/hr) 3545 90 (0.4)   Total Output 3545 90   Net -2859.7 -46.6        Urine Occurrence 1 x    Stool Occurrence     Emesis Occurrence       PHYSICAL EXAMINATION: General:  Ill appearing Neuro:  Alert, normal strength HEENT:  No sinus tenderness Cardiovascular:  regular Lungs:  Basilar rales Rt > Lt Abdomen:  Soft, non tender Musculoskeletal: no edema Skin: no rashes  LABS: CBC Recent Labs     10/29/12  0904  10/29/12  2345  10/30/12  0430  WBC  10.8*  8.6  9.1  HGB  9.5*  9.7*  9.6*  HCT  29.3*  28.1*  28.5*  PLT  187  212  210   BMET Recent Labs     10/29/12  0904  10/30/12  0430  NA  134*  137  K  4.5   3.4*  CL  99  99  CO2  24  27  BUN  9  15  CREATININE  0.86  1.13*  GLUCOSE  168*  137*    Electrolytes Recent Labs     10/29/12  0904  10/30/12  0430  CALCIUM  9.2  9.0  MG   --   1.1*    Sepsis Markers Recent Labs     10/29/12  2345  10/30/12  0430  PROCALCITON  0.81  0.85   Cardiac Enzymes Recent Labs     10/29/12  0904  TROPONINI  <0.30  PROBNP  6239.0*    Glucose Recent Labs     10/29/12  1612  10/29/12  2020  10/29/12  2316  10/30/12  0349  10/30/12  0747  GLUCAP  150*  168*  128*  137*  149*    Imaging Ct Angio Chest Pe W/cm &/or Wo Cm  10/29/2012   *RADIOLOGY REPORT*  Clinical Data: Shortness of breath.  Cough.  History breast cancer.  Ongoing chemotherapy.  CT ANGIOGRAPHY CHEST  Technique:  Multidetector CT imaging of the chest using the standard protocol during bolus administration of intravenous  contrast. Multiplanar reconstructed images including MIPs were obtained and reviewed to evaluate the vascular anatomy.  Contrast: OMNIPAQUE IOHEXOL 350 MG/ML SOLN  Comparison: 03/27/2012 PET CT.  Findings: Respiratory motion degraded examination without medium or large size pulmonary embolus.  Evaluation of peripheral branch is limited by the motion.  Cardiomegaly.  Small to slightly moderate sized pericardial effusion.  Prominent coronary artery calcifications.  Small to slightly moderate size right-sided pleural effusion with basilar atelectasis.  Minimal left-sided pleural effusion with basilar atelectasis.  No obvious pulmonary nodule although evaluation limited by motion.  Atherosclerotic type changes of the aorta.  No aneurysmal dilation. No obvious dissection although evaluation not performed to evaluate the aorta.  Right breast mass.  No axillary, hilar or mediastinal adenopathy.  No bony destructive lesion.  Limited imaging of the upper abdomen reveals prominent hepatic veins which may indicate increased right heart pressure.  Adrenal glands not imaged.   IMPRESSION: Respiratory motion degraded examination without medium or large size pulmonary embolus.  Evaluation of peripheral branch is limited by the motion.  Cardiomegaly.  Small to slightly moderate sized pericardial effusion.  Prominent coronary artery calcifications.  Small to slightly moderate size right-sided pleural effusion with basilar atelectasis.  Minimal left-sided pleural effusion with basilar atelectasis. Question mild congestive heart failure.  Right breast mass.  No axillary, hilar or mediastinal adenopathy.  No bony destructive lesion.  Limited imaging of the upper abdomen reveals prominent hepatic veins which may indicate increased right heart pressure.   Original Report Authenticated By: Lacy Duverney, M.D.   Dg Chest Port 1 View  10/30/2012   *RADIOLOGY REPORT*  Clinical Data: [place].  Short of breath.  Congestion.  PORTABLE CHEST - 1 VIEW  Comparison: Pulmonary embolism study 10/29/2012.  Findings: Cardiomegaly and bilateral pleural effusions remain present.  Basilar atelectasis.  Airspace disease at the bases cannot be excluded.  Allowing for technical differences, no interval change compared yesterday's examination.  Left subclavian Port-A-Cath appears in good position.  IMPRESSION: Small right and small to moderate left pleural effusions with cardiomegaly and basilar atelectasis.  Allowing for technical differences, no interval change compared yesterday's CT.   Original Report Authenticated By: Andreas Newport, M.D.       ASSESSMENT / PLAN:  PULMONARY A: Acute respiratory distress 2nd to pulmonary edema and atelectasis. Hx of COPD and continued tobacco use. P:   -f/u CXR intermittently -oxygen to keep SpO2 > 92% -bronchial hygiene  CARDIOVASCULAR A: Acute on chronic systolic heart failure >> EF 20%. 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 P:  -milrinone per cardiology -for PICC line placement -lasix per cardiology   RENAL A:  Acute kidney injury. P:   -f/u renal fx,  urine outpt, electrolytes  GASTROINTESTINAL A:  Nutrition. P:   -carb modif diet  HEMATOLOGIC A:  Anemia of chronic disease. Breast cancer. P:  -f/u CBC -SCD for DVT prevention  INFECTIOUS A: no evidence for infection. P:   -monitor clinically  ENDOCRINE A:  No issues.   P:   -SSI  NEUROLOGIC A: Hx of BiPolar. P:   -continue meds.  Plan transfer to SDU.  Plan for PICC by IR.  Will ask [facility] to assume care and PCCM sign off.  Coralyn Helling, MD [facility] Pulmonary/Critical Care 10/30/2012, 10:28 AM Pager:  [phone] After 3pm call: [phone]

## 2012-10-30 NOTE — Progress Notes (Signed)
Pt's sisters at bedside and cardiac catherization explained to pt and family by Ulla Potash. All verbalize understanding and agree to procedure. Pt's sister, Merdis Delay, signed consent as patient was eating dinner and pt possibly confused, per family. Pt answers questions appropriately and alert and oriented.

## 2012-10-31 ENCOUNTER — Encounter (HOSPITAL_COMMUNITY)
Admission: EM | Disposition: A | Discharge status: Skilled Nursing Facility | Payer: Self-pay | Source: Home / Self Care | Attending: Internal Medicine

## 2012-10-31 ENCOUNTER — Other Ambulatory Visit (HOSPITAL_COMMUNITY): Payer: Self-pay | Admitting: Oncology

## 2012-10-31 DIAGNOSIS — I2510 Atherosclerotic heart disease of native coronary artery without angina pectoris: Secondary | ICD-10-CM

## 2012-10-31 HISTORY — PX: LEFT AND RIGHT HEART CATHETERIZATION WITH CORONARY ANGIOGRAM: SHX5449

## 2012-10-31 LAB — PROTIME-INR
INR: 1.2 (ref 0.00–1.49)
Prothrombin Time: 14.9 seconds (ref 11.6–15.2)

## 2012-10-31 LAB — BASIC METABOLIC PANEL
CO2: 29 mEq/L (ref 19–32)
Calcium: 8.3 mg/dL — ABNORMAL LOW (ref 8.4–10.5)
Creatinine, Ser: 1.05 mg/dL (ref 0.50–1.10)
GFR calc Af Amer: 65 mL/min — ABNORMAL LOW (ref >90)
GFR calc non Af Amer: 56 mL/min — ABNORMAL LOW (ref >90)
Sodium: 136 mEq/L (ref 135–145)

## 2012-10-31 LAB — URINE CULTURE: Colony Count: NO GROWTH

## 2012-10-31 LAB — POCT I-STAT 3, ART BLOOD GAS (G3+)
Bicarbonate: 24.2 mEq/L — ABNORMAL HIGH (ref 20.0–24.0)
O2 Saturation: 95 %
TCO2: 25 mmol/L (ref 0–100)
pH, Arterial: 7.429 (ref 7.350–7.450)

## 2012-10-31 LAB — CBC
MCHC: 33.3 g/dL (ref 30.0–36.0)
MCV: 96.4 fL (ref 78.0–100.0)
Platelets: 175 10*3/uL (ref 150–400)
Platelets: 217 10*3/uL (ref 150–400)
RBC: 2.68 MIL/uL — ABNORMAL LOW (ref 3.87–5.11)
RDW: 15.4 % (ref 11.5–15.5)
RDW: 15 % (ref 11.5–15.5)
WBC: 7.9 10*3/uL (ref 4.0–10.5)

## 2012-10-31 LAB — POCT I-STAT 3, VENOUS BLOOD GAS (G3P V)
Bicarbonate: 25.3 mEq/L — ABNORMAL HIGH (ref 20.0–24.0)
O2 Saturation: 40 %
TCO2: 27 mmol/L (ref 0–100)
pO2, Ven: 24 mmHg — CL (ref 30.0–45.0)

## 2012-10-31 LAB — GLUCOSE, CAPILLARY

## 2012-10-31 LAB — CARBOXYHEMOGLOBIN: Carboxyhemoglobin: 1.2 % (ref 0.5–1.5)

## 2012-10-31 LAB — CREATININE, SERUM: GFR calc Af Amer: >90 mL/min (ref >90)

## 2012-10-31 LAB — PROCALCITONIN: Procalcitonin: 0.66 ng/mL

## 2012-10-31 SURGERY — LEFT AND RIGHT HEART CATHETERIZATION WITH CORONARY ANGIOGRAM
Anesthesia: LOCAL

## 2012-10-31 MED ORDER — HEPARIN SODIUM (PORCINE) 5000 UNIT/ML IJ SOLN
5000.0000 [IU] | Freq: Three times a day (TID) | INTRAMUSCULAR | Status: DC
  Administered 2012-10-31 – 2012-11-03 (×9): 5000 [IU] via SUBCUTANEOUS
  Filled 2012-10-31 (×11): qty 1

## 2012-10-31 MED ORDER — POTASSIUM CHLORIDE CRYS ER 20 MEQ PO TBCR
40.0000 meq | EXTENDED_RELEASE_TABLET | ORAL | Status: AC
  Administered 2012-10-31 (×3): 40 meq via ORAL
  Filled 2012-10-31 (×2): qty 2

## 2012-10-31 MED ORDER — DEXTROSE 5 % IV SOLN
1.0000 g | INTRAVENOUS | Status: DC
  Administered 2012-10-31 – 2012-11-03 (×4): 1 g via INTRAVENOUS
  Filled 2012-10-31 (×5): qty 10

## 2012-10-31 MED ORDER — ACETAMINOPHEN 325 MG PO TABS
650.0000 mg | ORAL_TABLET | Freq: Once | ORAL | Status: AC
  Administered 2012-10-31: 650 mg via ORAL
  Filled 2012-10-31: qty 2

## 2012-10-31 MED ORDER — ATORVASTATIN CALCIUM 40 MG PO TABS
40.0000 mg | ORAL_TABLET | Freq: Every day | ORAL | Status: DC
  Administered 2012-10-31 – 2012-11-07 (×7): 40 mg via ORAL
  Filled 2012-10-31 (×8): qty 1

## 2012-10-31 MED ORDER — NOREPINEPHRINE BITARTRATE 1 MG/ML IJ SOLN
2.0000 ug/min | INTRAVENOUS | Status: DC
  Administered 2012-10-31 – 2012-11-01 (×2): 2 ug/min via INTRAVENOUS
  Administered 2012-11-01: 4 ug/min via INTRAVENOUS
  Filled 2012-10-31 (×3): qty 4

## 2012-10-31 MED ORDER — LIDOCAINE HCL (PF) 1 % IJ SOLN
INTRAMUSCULAR | Status: AC
  Filled 2012-10-31: qty 30

## 2012-10-31 MED ORDER — ONDANSETRON HCL 4 MG/2ML IJ SOLN: 4.0000 mg | Freq: Four times a day (QID) | INTRAMUSCULAR | Status: DC | PRN

## 2012-10-31 MED ORDER — FENTANYL CITRATE 0.05 MG/ML IJ SOLN
INTRAMUSCULAR | Status: AC
  Filled 2012-10-31: qty 2

## 2012-10-31 MED ORDER — HEPARIN (PORCINE) IN NACL 2-0.9 UNIT/ML-% IJ SOLN
INTRAMUSCULAR | Status: AC
  Filled 2012-10-31: qty 1000

## 2012-10-31 MED ORDER — DOBUTAMINE IN D5W 4-5 MG/ML-% IV SOLN: 2.5000 ug/kg/min | INTRAVENOUS | Status: DC

## 2012-10-31 MED ORDER — ACETAMINOPHEN 325 MG PO TABS: 650.0000 mg | ORAL_TABLET | ORAL | Status: DC | PRN

## 2012-10-31 MED ORDER — DOBUTAMINE IN D5W 4-5 MG/ML-% IV SOLN
3.0000 ug/kg/min | INTRAVENOUS | Status: DC
  Administered 2012-10-31: 3 ug/kg/min via INTRAVENOUS
  Administered 2012-11-03: 5 ug/kg/min via INTRAVENOUS
  Filled 2012-10-31 (×3): qty 250

## 2012-10-31 MED ORDER — SODIUM CHLORIDE 0.9 % IJ SOLN: 3.0000 mL | Freq: Two times a day (BID) | INTRAMUSCULAR | Status: DC

## 2012-10-31 MED ORDER — SODIUM CHLORIDE 0.9 % IJ SOLN: 3.0000 mL | INTRAMUSCULAR | Status: DC | PRN

## 2012-10-31 MED ORDER — SODIUM CHLORIDE 0.9 % IJ SOLN
3.0000 mL | Freq: Two times a day (BID) | INTRAMUSCULAR | Status: DC
  Administered 2012-11-01 – 2012-11-03 (×3): 3 mL via INTRAVENOUS

## 2012-10-31 MED ORDER — SODIUM CHLORIDE 0.9 % IV SOLN: 250.0000 mL | INTRAVENOUS | Status: DC | PRN

## 2012-10-31 MED ORDER — ASPIRIN 81 MG PO CHEW
324.0000 mg | CHEWABLE_TABLET | ORAL | Status: AC
  Administered 2012-10-31: 324 mg via ORAL
  Filled 2012-10-31: qty 4

## 2012-10-31 MED ORDER — MIDAZOLAM HCL 2 MG/2ML IJ SOLN
INTRAMUSCULAR | Status: AC
  Filled 2012-10-31: qty 2

## 2012-10-31 MED ORDER — ASPIRIN 81 MG PO CHEW
81.0000 mg | CHEWABLE_TABLET | Freq: Every day | ORAL | Status: DC
  Administered 2012-10-31 – 2012-11-07 (×8): 81 mg via ORAL
  Filled 2012-10-31 (×7): qty 1

## 2012-10-31 MED ORDER — POTASSIUM CHLORIDE CRYS ER 20 MEQ PO TBCR
40.0000 meq | EXTENDED_RELEASE_TABLET | Freq: Every day | ORAL | Status: DC
  Administered 2012-11-01 – 2012-11-03 (×3): 40 meq via ORAL
  Filled 2012-10-31 (×3): qty 2

## 2012-10-31 MED ORDER — NITROGLYCERIN 0.2 MG/ML ON CALL CATH LAB
INTRAVENOUS | Status: AC
  Filled 2012-10-31: qty 1

## 2012-10-31 NOTE — Progress Notes (Addendum)
[facility] PROGRESS NOTE  DAVANNA HE UJW:[id number] DOB: 01-17-1951 DOA: 10/29/2012 PCP: [name], MD  Assessment/Plan: Active Problems:   Hypotension, unspecified   Pleural effusion   Pericardial effusion   Acute systolic CHF (congestive heart failure), NYHA class 4   Congestive dilated cardiomyopathy    Acute systolic heart failure This is due to Adriamycin toxicity Heart failure team following Diurese as per  the heart failure team Hold off on ACE inhibitor LHC/RHC today   Hypomagnesemia replete   Shortness of breath  CT scan did not show any pulmonary embolism Given fever, will repeat chest x-ray Follow blood culture   Anemia of chronic disease Follow CBC    Code Status: full Family Communication: family updated about patient's clinical progress Disposition Plan:  As above    Brief narrative: [name] is a 62 y/o female with a PMHx of COPD, CVA, HLD, DM2, schizophrenia and R. Breast CA (triple negative) who is currently being treated with radiation and s/p chemiotherapy (Adriamycin).  She presented to [facility] ED yesterday am with SOB with a dry cough. She did not complain of any fevers, chills, n/v or edema. When she presented to the ED she had tachycardia, tachypnea and hypotension. She had a CT of the chest that r/o PE, but showed pericardial and pleural effusion.  ECHO: Reviewed echo from [facility]. Showed EF 20% with global hypokinesis, moderate RV dysfunction, trivial pericardial effusion.  On milrinone along with 80 mg IV lasix Q 8 hrs. Weight up 7 lbs, but weighing in bed. 24 hr I/O -2.3 liters. CVP 13 and awaiting co-ox. Patient denies SOB, orthopnea or CP. Fever spiked to 100.8, and was having some mild confusion last night, blood cx ordered. SBP over night in the 80s.     Consultants:  Cardiology  Critical care      Procedures:  PICC line placement      Antibiotics:  None  HPI/Subjective: Denies any  chest pain or shortness of breath, soft  blood pressure  Objective: Filed Vitals:   10/31/12 1100 10/31/12 1156 10/31/12 1200 10/31/12 1230  BP: 91/56 78/54 99/64  112/76  Pulse: 55 114 59 120  Temp:  98.6 F (37 C)    TempSrc:  Oral    Resp: 19 13 15 26   Height:      Weight:      SpO2: 95% 95% 96% 95%    Intake/Output Summary (Last 24 hours) at 10/31/12 1246 Last data filed at 10/31/12 1200  Gross per 24 hour  Intake 1067.1 ml  Output   3200 ml  Net -2132.9 ml    Exam:  General: Fatigues appearing. No resp difficulty  HEENT: normal  Neck: supple. CVP 13. Carotids 2+ bilat; no bruits. No lymphadenopathy or thryomegaly appreciated.  Cor: PMI nondisplaced. Mildly tachycardic, regular rate & regular rhythm. No rubs, gallops or murmurs.  Lungs: clear  Abdomen: soft, nontender, mild distended. No hepatosplenomegaly. No bruits or masses. Good bowel sounds.  Extremities: no cyanosis, clubbing, rash, edema  Neuro: alert & orientedx3, cranial nerves grossly intact. moves all 4 extremities w/o difficulty. Affect pleasant    Data Reviewed: Basic Metabolic Panel:  Recent Labs Lab 10/29/12 0904 10/30/12 0430 10/31/12 0345  NA 134* 137 136  K 4.5 3.4* 3.1*  CL 99 99 98  CO2 24 27 29   GLUCOSE 168* 137* 137*  BUN 9 15 13   CREATININE 0.86 1.13* 1.05  CALCIUM 9.2 9.0 8.3*  MG  --  1.1*  --  Liver Function Tests: No results found for this basename: AST, ALT, ALKPHOS, BILITOT, PROT, ALBUMIN,  in the last 168 hours No results found for this basename: LIPASE, AMYLASE,  in the last 168 hours No results found for this basename: AMMONIA,  in the last 168 hours  CBC:  Recent Labs Lab 10/29/12 0904 10/29/12 2345 10/30/12 0430 10/31/12 0345  WBC 10.8* 8.6 9.1 7.9  NEUTROABS 9.3*  --   --   --   HGB 9.5* 9.7* 9.6* 8.7*  HCT 29.3* 28.1* 28.5* 25.6*  MCV 98.7 95.6 96.0 95.5  PLT 187 212 210 175    Cardiac Enzymes:  Recent Labs Lab 10/29/12 0904  TROPONINI <0.30    BNP (last 3 results)  Recent Labs  10/29/12 0904  PROBNP 6239.0*     CBG:  Recent Labs Lab 10/30/12 0747 10/30/12 1220 10/30/12 1610 10/30/12 2153 10/31/12 1154  GLUCAP 149* 129* 170* 157* 134*    Recent Results (from the past 240 hour(s))  MRSA PCR SCREENING     Status: None   Collection Time    10/29/12  5:30 PM      Result Value Range Status   MRSA by PCR NEGATIVE  NEGATIVE Final   Comment:            The GeneXpert MRSA Assay (FDA     approved for NASAL specimens     only), is one component of a     comprehensive MRSA colonization     surveillance program. It is not     intended to diagnose MRSA     infection nor to guide or     monitor treatment for     MRSA infections.  URINE CULTURE     Status: None   Collection Time    10/30/12  8:41 AM      Result Value Range Status   Specimen Description URINE, CATHETERIZED   Final   Special Requests NONE   Final   Culture  Setup Time 10/30/2012 10:05   Final   Colony Count NO GROWTH   Final   Culture NO GROWTH   Final   Report Status 10/31/2012 FINAL   Final     Studies: Ct Angio Chest Pe W/cm &/or Wo Cm  10/29/2012   *RADIOLOGY REPORT*  Clinical Data: Shortness of breath.  Cough.  History breast cancer.  Ongoing chemotherapy.  CT ANGIOGRAPHY CHEST  Technique:  Multidetector CT imaging of the chest using the standard protocol during bolus administration of intravenous contrast. Multiplanar reconstructed images including MIPs were obtained and reviewed to evaluate the vascular anatomy.  Contrast: OMNIPAQUE IOHEXOL 350 MG/ML SOLN  Comparison: 03/27/2012 PET CT.  Findings: Respiratory motion degraded examination without medium or large size pulmonary embolus.  Evaluation of peripheral branch is limited by the motion.  Cardiomegaly.  Small to slightly moderate sized pericardial effusion.  Prominent coronary artery calcifications.  Small to slightly moderate size right-sided pleural effusion with basilar atelectasis.   Minimal left-sided pleural effusion with basilar atelectasis.  No obvious pulmonary nodule although evaluation limited by motion.  Atherosclerotic type changes of the aorta.  No aneurysmal dilation. No obvious dissection although evaluation not performed to evaluate the aorta.  Right breast mass.  No axillary, hilar or mediastinal adenopathy.  No bony destructive lesion.  Limited imaging of the upper abdomen reveals prominent hepatic veins which may indicate increased right heart pressure.  Adrenal glands not imaged.  IMPRESSION: Respiratory motion degraded examination without medium or large size  pulmonary embolus.  Evaluation of peripheral branch is limited by the motion.  Cardiomegaly.  Small to slightly moderate sized pericardial effusion.  Prominent coronary artery calcifications.  Small to slightly moderate size right-sided pleural effusion with basilar atelectasis.  Minimal left-sided pleural effusion with basilar atelectasis. Question mild congestive heart failure.  Right breast mass.  No axillary, hilar or mediastinal adenopathy.  No bony destructive lesion.  Limited imaging of the upper abdomen reveals prominent hepatic veins which may indicate increased right heart pressure.   Original Report Authenticated By: Lacy Duverney, M.D.   Ir Fluoro Guide Cv Line Right  10/30/2012   *RADIOLOGY REPORT*  Clinical Data: Diabetes mellitus  PICC LINE PLACEMENT WITH ULTRASOUND AND FLUOROSCOPIC  GUIDANCE  Fluoroscopy Time: 2 seconds.  The right arm was prepped with chlorhexidine, draped in the usual sterile fashion using maximum barrier technique (cap and mask, sterile gown, sterile gloves, large sterile sheet, hand hygiene and cutaneous antisepsis) and infiltrated locally with 1% Lidocaine.  Ultrasound demonstrated patency of the right basilic vein, and this was documented with an image.  Under real-time ultrasound guidance, this vein was accessed with a 21 gauge micropuncture needle and image documentation was  performed.  The needle was exchanged over a guidewire for a peel-away sheath through which a five [place] single lumen PICC trimmed to 37 cm was advanced, positioned with its tip at the lower SVC/right atrial junction.  Fluoroscopy during the procedure and fluoro spot radiograph confirms appropriate catheter position.  The catheter was flushed, secured to the skin with Prolene sutures, and covered with a sterile dressing.  Complications:  None  IMPRESSION: Successful right arm PICC line placement with ultrasound and fluoroscopic guidance.  The catheter is ready for use.   Original Report Authenticated By: Jolaine Click, M.D.   Ir US Guide Vasc Access Right  10/30/2012   *RADIOLOGY REPORT*  Clinical Data: Diabetes mellitus  PICC LINE PLACEMENT WITH ULTRASOUND AND FLUOROSCOPIC  GUIDANCE  Fluoroscopy Time: 2 seconds.  The right arm was prepped with chlorhexidine, draped in the usual sterile fashion using maximum barrier technique (cap and mask, sterile gown, sterile gloves, large sterile sheet, hand hygiene and cutaneous antisepsis) and infiltrated locally with 1% Lidocaine.  Ultrasound demonstrated patency of the right basilic vein, and this was documented with an image.  Under real-time ultrasound guidance, this vein was accessed with a 21 gauge micropuncture needle and image documentation was performed.  The needle was exchanged over a guidewire for a peel-away sheath through which a five [place] single lumen PICC trimmed to 37 cm was advanced, positioned with its tip at the lower SVC/right atrial junction.  Fluoroscopy during the procedure and fluoro spot radiograph confirms appropriate catheter position.  The catheter was flushed, secured to the skin with Prolene sutures, and covered with a sterile dressing.  Complications:  None  IMPRESSION: Successful right arm PICC line placement with ultrasound and fluoroscopic guidance.  The catheter is ready for use.   Original Report Authenticated By: Jolaine Click, M.D.    Dg Chest Port 1 View  10/30/2012   *RADIOLOGY REPORT*  Clinical Data: Fields.  Short of breath.  Congestion.  PORTABLE CHEST - 1 VIEW  Comparison: Pulmonary embolism study 10/29/2012.  Findings: Cardiomegaly and bilateral pleural effusions remain present.  Basilar atelectasis.  Airspace disease at the bases cannot be excluded.  Allowing for technical differences, no interval change compared yesterday's examination.  Left subclavian Port-A-Cath appears in good position.  IMPRESSION: Small right and small to moderate left pleural  effusions with cardiomegaly and basilar atelectasis.  Allowing for technical differences, no interval change compared yesterday's CT.   Original Report Authenticated By: Andreas Newport, M.D.    Scheduled Meds: . fluPHENAZine  2.5 mg Oral QHS  . furosemide  40 mg Intravenous TID  . loxapine  10 mg Oral QHS  . nortriptyline  100 mg Oral QHS  . [START ON 11/01/2012] potassium chloride  40 mEq Oral Daily  . potassium chloride  40 mEq Oral Q4H  . sodium chloride  10 mL Intravenous Q12H  . sodium chloride  3 mL Intravenous Q12H   Continuous Infusions: . sodium chloride 10 mL/hr at 10/31/12 0800  . milrinone 0.264 mcg/kg/min (10/31/12 0940)  . norepinephrine (LEVOPHED) Adult infusion 4 mcg/min (10/31/12 1200)    Active Problems:   Hypotension, unspecified   Pleural effusion   Pericardial effusion   Acute systolic CHF (congestive heart failure), NYHA class 4   Congestive dilated cardiomyopathy    Time spent: 40 minutes   [facility]  [facility] Pager [phone]. If 8PM-8AM, please contact night-coverage at [email or web address], password [email or web address] 10/31/2012, 12:46 PM  LOS: 2 days

## 2012-10-31 NOTE — CV Procedure (Signed)
   Cardiac Catheterization Procedure Note  Name: [name] MRN: [id number] DOB: June 14, 1950  Procedure: Right Heart Cath, Left Heart Cath, Selective Coronary Angiography, LV angiography  Indication: Cardiogenic shock   Procedural Details: The right groin was prepped, draped, and anesthetized with 1% lidocaine. Using the modified Seldinger technique a 5 French sheath was placed in the right femoral artery and a 7 French sheath was placed in the right femoral vein. A Swan-Ganz catheter was used for the right heart catheterization. Standard protocol was followed for recording of right heart pressures and sampling of oxygen saturations. Fick and thermodilution cardiac output was calculated. MP catheter was used for selective coronary angiography and left ventriculography. There were no immediate procedural complications. The patient was transferred to the post catheterization recovery area for further monitoring.  Procedural Findings: Hemodynamics (mmHg) RA mean 18 RV 45/20 PA 41/21, mean 29 PCWP mean 21 LV 95/16 AO 109/67  Oxygen saturations: PA 40% AO 95%  Cardiac Output (Fick) 3.5  Cardiac Index (Fick) 2.05  Cardiac Output (Thermo) 2.9 Cardiac Index (Thermo) 1.7   SVR 1799  Coronary angiography: Coronary dominance: right  Left mainstem: 60-70% distal left main stenosis.   Left anterior descending (LAD): Luminal irregularities in the LAD.  80% ostial stenosis small D1.   Left circumflex (LCx): Small vessel, no significant disease.   Right coronary artery (RCA): Long 80% mid RCA stenosis.   Left ventriculography: EF 25-30%, global hypokinesis.   Final Conclusions:  Elevated right-sided pressure out of proportion to PCWP. CI remains low, 2.05 by Fick.  Given soft blood pressure, I am going to stop milrinone and will see if she responds better to dobutamine at 3.  Continue Lasix at current dose, renal function is stable. If she does not respond well, we may need to  place balloon pump.   60-70% distal left main, 80% long mid RCA.  I do not think that this caused her cardiomyopathy, suspect that adriamycin is the culprit.  However, this will need to be addressed when she is stabilized, will need IVUS of LM.  Will start ASA 81 and atorvastatin.   Marca Ancona 10/31/2012, 4:13 PM

## 2012-10-31 NOTE — Progress Notes (Signed)
eLink Physician-Brief Progress Note Patient Name: VIBHA FERDIG DOB: 12-18-1950 MRN: [id number]  Date of Service  10/31/2012   HPI/Events of Note   1. New fever,.  Recent Labs Lab 10/29/12 0904 10/29/12 2345 10/30/12 0430  WBC 10.8* 8.6 9.1    Recent Labs Lab 10/29/12 2345 10/30/12 0430  PROCALCITON 0.81 0.85   Antibiotics Given (last 72 hours)   None     Recent Results (from the past 240 hour(s))  MRSA PCR SCREENING     Status: None   Collection Time    10/29/12  5:30 PM      Result Value Range Status   MRSA by PCR NEGATIVE  NEGATIVE Final   Comment:            The GeneXpert MRSA Assay (FDA     approved for NASAL specimens     only), is one component of a     comprehensive MRSA colonization     surveillance program. It is not     intended to diagnose MRSA     infection nor to guide or     monitor treatment for     MRSA infections.    2. Sugars normal r  eICU Interventions  1. FEver - check blood culture, await urine culture 2. Sugars - dc icu hyperglycemia protocol   Intervention Category Minor Interventions: Other:;Routine modifications to care plan (e.g. PRN medications for pain, fever)  Shonya Sumida 10/31/2012, 12:42 AM

## 2012-10-31 NOTE — Progress Notes (Signed)
Advanced Heart Failure Rounding Note   Subjective:    [name] is a 62 y/o female with a PMHx of COPD, CVA, HLD, DM2, schizophrenia and R. Breast CA (triple negative) who is currently being treated with radiation and s/p chemiotherapy (Adriamycin).   She presented to [facility] ED yesterday am with SOB with a dry cough. She did not complain of any fevers, chills, n/v or edema. When she presented to the ED she had tachycardia, tachypnea and hypotension. She had a CT of the chest that r/o PE, but showed pericardial and pleural effusion.   ECHO: Reviewed echo from [facility]. Showed EF 20% with global hypokinesis, moderate RV dysfunction, trivial pericardial effusion.   On milrinone along with 80 mg IV lasix Q 8 hrs. Weight up 7 lbs, but weighing in bed. 24 hr I/O -2.3 liters. CVP 13 and awaiting co-ox. Patient denies SOB, orthopnea or CP. Fever spiked to 100.8, and was having some mild confusion last night, blood cx ordered. SBP over night in the 80s.   Co-ox 53% Objective:   Weight Range:  Vital Signs:   Temp:  [98.1 F (36.7 C)-100.8 F (38.2 C)] 98.5 F (36.9 C) (07/18 0400) Pulse Rate:  [53-130] 105 (07/18 0702) Resp:  [11-33] 16 (07/18 0702) BP: (58-110)/(31-70) 79/43 mmHg (07/18 0702) SpO2:  [91 %-99 %] 97 % (07/18 0702) Weight:  [147 lb 0.8 oz (66.7 kg)] 147 lb 0.8 oz (66.7 kg) (07/18 0500) Last BM Date: 10/30/12  Weight change: Filed Weights   10/29/12 1500 10/30/12 0500 10/31/12 0500  Weight: 143 lb 8.3 oz (65.1 kg) 140 lb 6.9 oz (63.7 kg) 147 lb 0.8 oz (66.7 kg)    Intake/Output:   Intake/Output Summary (Last 24 hours) at 10/31/12 0815 Last data filed at 10/31/12 0700  Gross per 24 hour  Intake   1127 ml  Output   3685 ml  Net  -2558 ml     Physical Exam:  General: Fatigues appearing. No resp difficulty  HEENT: normal  Neck: supple. CVP 13. Carotids 2+ bilat; no bruits. No lymphadenopathy or thryomegaly appreciated.  Cor: PMI nondisplaced. Mildly  tachycardic, regular rate & regular rhythm. No rubs, gallops or murmurs.  Lungs: clear  Abdomen: soft, nontender, mild distended. No hepatosplenomegaly. No bruits or masses. Good bowel sounds.  Extremities: no cyanosis, clubbing, rash, edema  Neuro: alert & orientedx3, cranial nerves grossly intact. moves all 4 extremities w/o difficulty. Affect pleasant   Telemetry: ST 115-120s  Labs: Basic Metabolic Panel:  Recent Labs Lab 10/29/12 0904 10/30/12 0430 10/31/12 0345  NA 134* 137 136  K 4.5 3.4* 3.1*  CL 99 99 98  CO2 24 27 29   GLUCOSE 168* 137* 137*  BUN 9 15 13   CREATININE 0.86 1.13* 1.05  CALCIUM 9.2 9.0 8.3*  MG  --  1.1*  --     Liver Function Tests: No results found for this basename: AST, ALT, ALKPHOS, BILITOT, PROT, ALBUMIN,  in the last 168 hours No results found for this basename: LIPASE, AMYLASE,  in the last 168 hours No results found for this basename: AMMONIA,  in the last 168 hours  CBC:  Recent Labs Lab 10/29/12 0904 10/29/12 2345 10/30/12 0430 10/31/12 0345  WBC 10.8* 8.6 9.1 7.9  NEUTROABS 9.3*  --   --   --   HGB 9.5* 9.7* 9.6* 8.7*  HCT 29.3* 28.1* 28.5* 25.6*  MCV 98.7 95.6 96.0 95.5  PLT 187 212 210 175    Cardiac Enzymes:  Recent Labs Lab 10/29/12 0904  TROPONINI <0.30    BNP: BNP (last 3 results)  Recent Labs  10/29/12 0904  PROBNP 6239.0*     Other results:  EKG:   Imaging: Ct Angio Chest Pe W/cm &/or Wo Cm  10/29/2012   *RADIOLOGY REPORT*  Clinical Data: Shortness of breath.  Cough.  History breast cancer.  Ongoing chemotherapy.  CT ANGIOGRAPHY CHEST  Technique:  Multidetector CT imaging of the chest using the standard protocol during bolus administration of intravenous contrast. Multiplanar reconstructed images including MIPs were obtained and reviewed to evaluate the vascular anatomy.  Contrast: OMNIPAQUE IOHEXOL 350 MG/ML SOLN  Comparison: 03/27/2012 PET CT.  Findings: Respiratory motion degraded examination  without medium or large size pulmonary embolus.  Evaluation of peripheral branch is limited by the motion.  Cardiomegaly.  Small to slightly moderate sized pericardial effusion.  Prominent coronary artery calcifications.  Small to slightly moderate size right-sided pleural effusion with basilar atelectasis.  Minimal left-sided pleural effusion with basilar atelectasis.  No obvious pulmonary nodule although evaluation limited by motion.  Atherosclerotic type changes of the aorta.  No aneurysmal dilation. No obvious dissection although evaluation not performed to evaluate the aorta.  Right breast mass.  No axillary, hilar or mediastinal adenopathy.  No bony destructive lesion.  Limited imaging of the upper abdomen reveals prominent hepatic veins which may indicate increased right heart pressure.  Adrenal glands not imaged.  IMPRESSION: Respiratory motion degraded examination without medium or large size pulmonary embolus.  Evaluation of peripheral branch is limited by the motion.  Cardiomegaly.  Small to slightly moderate sized pericardial effusion.  Prominent coronary artery calcifications.  Small to slightly moderate size right-sided pleural effusion with basilar atelectasis.  Minimal left-sided pleural effusion with basilar atelectasis. Question mild congestive heart failure.  Right breast mass.  No axillary, hilar or mediastinal adenopathy.  No bony destructive lesion.  Limited imaging of the upper abdomen reveals prominent hepatic veins which may indicate increased right heart pressure.   Original Report Authenticated By: Lacy Duverney, M.D.   Ir Fluoro Guide Cv Line Right  10/30/2012   *RADIOLOGY REPORT*  Clinical Data: Diabetes mellitus  PICC LINE PLACEMENT WITH ULTRASOUND AND FLUOROSCOPIC  GUIDANCE  Fluoroscopy Time: 2 seconds.  The right arm was prepped with chlorhexidine, draped in the usual sterile fashion using maximum barrier technique (cap and mask, sterile gown, sterile gloves, large sterile sheet,  hand hygiene and cutaneous antisepsis) and infiltrated locally with 1% Lidocaine.  Ultrasound demonstrated patency of the right basilic vein, and this was documented with an image.  Under real-time ultrasound guidance, this vein was accessed with a 21 gauge micropuncture needle and image documentation was performed.  The needle was exchanged over a guidewire for a peel-away sheath through which a five [place] single lumen PICC trimmed to 37 cm was advanced, positioned with its tip at the lower SVC/right atrial junction.  Fluoroscopy during the procedure and fluoro spot radiograph confirms appropriate catheter position.  The catheter was flushed, secured to the skin with Prolene sutures, and covered with a sterile dressing.  Complications:  None  IMPRESSION: Successful right arm PICC line placement with ultrasound and fluoroscopic guidance.  The catheter is ready for use.   Original Report Authenticated By: Jolaine Click, M.D.   Ir US Guide Vasc Access Right  10/30/2012   *RADIOLOGY REPORT*  Clinical Data: Diabetes mellitus  PICC LINE PLACEMENT WITH ULTRASOUND AND FLUOROSCOPIC  GUIDANCE  Fluoroscopy Time: 2 seconds.  The right arm was  prepped with chlorhexidine, draped in the usual sterile fashion using maximum barrier technique (cap and mask, sterile gown, sterile gloves, large sterile sheet, hand hygiene and cutaneous antisepsis) and infiltrated locally with 1% Lidocaine.  Ultrasound demonstrated patency of the right basilic vein, and this was documented with an image.  Under real-time ultrasound guidance, this vein was accessed with a 21 gauge micropuncture needle and image documentation was performed.  The needle was exchanged over a guidewire for a peel-away sheath through which a five [place] single lumen PICC trimmed to 37 cm was advanced, positioned with its tip at the lower SVC/right atrial junction.  Fluoroscopy during the procedure and fluoro spot radiograph confirms appropriate catheter position.  The  catheter was flushed, secured to the skin with Prolene sutures, and covered with a sterile dressing.  Complications:  None  IMPRESSION: Successful right arm PICC line placement with ultrasound and fluoroscopic guidance.  The catheter is ready for use.   Original Report Authenticated By: Jolaine Click, M.D.   Dg Chest Port 1 View  10/30/2012   *RADIOLOGY REPORT*  Clinical Data: Fields.  Short of breath.  Congestion.  PORTABLE CHEST - 1 VIEW  Comparison: Pulmonary embolism study 10/29/2012.  Findings: Cardiomegaly and bilateral pleural effusions remain present.  Basilar atelectasis.  Airspace disease at the bases cannot be excluded.  Allowing for technical differences, no interval change compared yesterday's examination.  Left subclavian Port-A-Cath appears in good position.  IMPRESSION: Small right and small to moderate left pleural effusions with cardiomegaly and basilar atelectasis.  Allowing for technical differences, no interval change compared yesterday's CT.   Original Report Authenticated By: Andreas Newport, M.D.      Medications:     Scheduled Medications: . fluPHENAZine  2.5 mg Oral QHS  . furosemide  40 mg Intravenous TID  . loxapine  10 mg Oral QHS  . nortriptyline  100 mg Oral QHS  . potassium chloride  40 mEq Oral Daily  . sodium chloride  10 mL Intravenous Q12H     Infusions: . sodium chloride 20 mL/hr at 10/31/12 0416  . milrinone 0.25 mcg/kg/min (10/30/12 1900)     PRN Medications:  sodium chloride, sodium chloride   Assessment:   1) A/C systolic HF, EF 20%  -likely d/t adriamycin toxicity  2) Breast CA  3) DM2  4) hypokalemia  5) hypomagnesia  5) Mild leukocytosis  6) Schizophrenia  Plan/Discussion:    1) A/C systolic HF  -likely due to adriamycin toxicity. Going for LHC/RHC today. SBP 80-90s, will discuss with Dr. Shirlee Latch about starting low dose norepi or switching to dobutamine. -CVP 13 will continue IV lasix 40 mg @ 8 hrs.. K+ low will supplement.  - No  b-blocker at this time due to shock/hypotension and will hold off on ACE-I.  - portacath saline locked, using PICC now 2) Mild leukocytosis- WBC down to 7.8, however spiked temp to 100.8, blood cx pending along with UA 3) Hypomagnesia- gave 4 mg yesterday will get repeat.   Length of Stay: 2   Aundria Rud 10/31/2012, 8:15 AM  Advanced Heart Failure Team Pager [phone] (M-F; 7a - 4p)  Please contact [facility] for night-coverage after hours (4p -7a ) and weekends on [email or web address]  Patient seen with NP, agree with the above note.   Feeling better overall, diuresed well.  Low grade fever.  Co-ox low at 54%.  SBP remains soft, 80s-90s.  ? Component of septic shock along with cardiogenic shock, continue milrinone but will add low  dose norepinephrine 2 mcg/min for now and repeat co-ox. CVP/JVP remain elevated, will continue diuresis.  Creatinine is stable, will need to replete potassium. Blood cultures pending.   LHC/RHC today to define hemodynamics (?low SVR) and also to define coronaries given cardiomyopathy and coronary calcification on CT.   Marca Ancona 10/31/2012 10:03 AM

## 2012-11-01 ENCOUNTER — Inpatient Hospital Stay (HOSPITAL_COMMUNITY): Payer: Medicare Other

## 2012-11-01 DIAGNOSIS — I2510 Atherosclerotic heart disease of native coronary artery without angina pectoris: Secondary | ICD-10-CM

## 2012-11-01 LAB — COMPREHENSIVE METABOLIC PANEL
ALT: 130 U/L — ABNORMAL HIGH (ref 0–35)
Alkaline Phosphatase: 166 U/L — ABNORMAL HIGH (ref 39–117)
Chloride: 97 mEq/L (ref 96–112)
GFR calc Af Amer: >90 mL/min (ref >90)
Glucose, Bld: 232 mg/dL — ABNORMAL HIGH (ref 70–99)
Potassium: 4.6 mEq/L (ref 3.5–5.1)
Sodium: 134 mEq/L — ABNORMAL LOW (ref 135–145)
Total Bilirubin: 0.3 mg/dL (ref 0.3–1.2)
Total Protein: 6.5 g/dL (ref 6.0–8.3)

## 2012-11-01 LAB — GLUCOSE, CAPILLARY
Glucose-Capillary: 188 mg/dL — ABNORMAL HIGH (ref 70–99)
Glucose-Capillary: 143 mg/dL — ABNORMAL HIGH (ref 70–99)
Glucose-Capillary: 191 mg/dL — ABNORMAL HIGH (ref 70–99)

## 2012-11-01 LAB — CARBOXYHEMOGLOBIN
Carboxyhemoglobin: 1.5 % (ref 0.5–1.5)
Methemoglobin: 0.9 % (ref 0.0–1.5)

## 2012-11-01 MED ORDER — FUROSEMIDE 10 MG/ML IJ SOLN
60.0000 mg | Freq: Three times a day (TID) | INTRAMUSCULAR | Status: AC
  Administered 2012-11-01 – 2012-11-03 (×6): 60 mg via INTRAVENOUS
  Filled 2012-11-01 (×6): qty 6

## 2012-11-01 NOTE — Progress Notes (Signed)
Patient refuses to wear O2 via nasal cannula with O2 saturation of 88%. I have explained the reasons for the need of oxygen at this time. She still refuses. She is in no distress at this time.

## 2012-11-01 NOTE — Progress Notes (Addendum)
[facility] PROGRESS NOTE  [name] ZOX:[id number] DOB: 05-01-1950 DOA: 10/29/2012 PCP: [name], MD  Assessment/Plan: Active Problems:   Hypotension, unspecified   Pleural effusion   Pericardial effusion   Acute systolic CHF (congestive heart failure), NYHA class 4   Congestive dilated cardiomyopathy     Acute systolic heart failure  This is due to Adriamycin toxicity  Heart failure team following  Diurese as per the heart failure team  Hold off on ACE inhibitor  LHC/RHC 60-70% distal left main, 80% long mid RCA RHC suggested significant RHC with RA pressure elevated out of proportion to PCWP increase Lasix to 80 mg IV every 8 hours No b-blocker/ACEI at this time due to shock/hypotension  - portacath saline locked, using PICC now   Hypomagnesemia replete    Shortness of breath  CT scan did not show any pulmonary embolism  Given fever, CXR shows Persistent but decreased bilateral pleural effusions with slightly  improved aeration at both lung bases as compared to 10/30/2012  Follow blood culture  Rocephin for uti   Anemia of chronic disease  Follow CBC   Code Status: full  Family Communication: family updated about patient's clinical progress  Disposition Plan: As above   Brief narrative:  [name] is a 62 y/o female with a PMHx of COPD, CVA, HLD, DM2, schizophrenia and R. Breast CA (triple negative) who is currently being treated with radiation and s/p chemiotherapy (Adriamycin).  She presented to [facility] ED yesterday am with SOB with a dry cough. She did not complain of any fevers, chills, n/v or edema. When she presented to the ED she had tachycardia, tachypnea and hypotension. She had a CT of the chest that r/o PE, but showed pericardial and pleural effusion.  ECHO: Reviewed echo from [facility]. Showed EF 20% with global hypokinesis, moderate RV dysfunction, trivial pericardial effusion.  On milrinone along with 80 mg IV lasix Q  8 hrs. Weight up 7 lbs, but weighing in bed. 24 hr I/O -2.3 liters. CVP 13 and awaiting co-ox. Patient denies SOB, orthopnea or CP. Fever spiked to 100.8, and was having some mild confusion last night, blood cx ordered. SBP over night in the 80s.   Consultants:  Cardiology  Critical care  Procedures:  PICC line placement  Antibiotics:  None HPI/Subjective:  Denies any chest pain or shortness of breath, soft blood pressure   Objective: Filed Vitals:   11/01/12 0500 11/01/12 0600 11/01/12 0700 11/01/12 0803  BP: 110/61 106/68 117/67 107/73  Pulse:    91  Temp:    98 F (36.7 C)  TempSrc:    Oral  Resp: 21 19 18 19   Height:      Weight: 65.8 kg (145 lb 1 oz)     SpO2:    95%    Intake/Output Summary (Last 24 hours) at 11/01/12 1105 Last data filed at 11/01/12 1002  Gross per 24 hour  Intake 1204.6 ml  Output   2375 ml  Net -1170.4 ml    Exam:  HENT:  Head: Atraumatic.  Nose: Nose normal.  Mouth/Throat: Oropharynx is clear and moist.  Eyes: Conjunctivae are normal. Pupils are equal, round, and reactive to light. No scleral icterus.  Neck: Neck supple. No tracheal deviation present.  Cardiovascular: Normal rate, regular rhythm, normal heart sounds and intact distal pulses.  Pulmonary/Chest: Effort normal and breath sounds normal. No respiratory distress.  Abdominal: Soft. Normal appearance and bowel sounds are normal. She exhibits no distension. There  is no tenderness.  Musculoskeletal: She exhibits no edema and no tenderness.  Neurological: She is alert. No cranial nerve deficit.    Data Reviewed: Basic Metabolic Panel:  Recent Labs Lab 10/29/12 0904 10/30/12 0430 10/31/12 0345 10/31/12 1817 11/01/12 0500  NA 134* 137 136  --  134*  K 4.5 3.4* 3.1*  --  4.6  CL 99 99 98  --  97  CO2 24 27 29   --  24  GLUCOSE 168* 137* 137*  --  232*  BUN 9 15 13   --  11  CREATININE 0.86 1.13* 1.05 0.78 0.76  CALCIUM 9.2 9.0 8.3*  --  8.5  MG  --  1.1*  --   --  1.5     Liver Function Tests:  Recent Labs Lab 11/01/12 0500  AST 81*  ALT 130*  ALKPHOS 166*  BILITOT 0.3  PROT 6.5  ALBUMIN 3.0*   No results found for this basename: LIPASE, AMYLASE,  in the last 168 hours No results found for this basename: AMMONIA,  in the last 168 hours  CBC:  Recent Labs Lab 10/29/12 0904 10/29/12 2345 10/30/12 0430 10/31/12 0345 10/31/12 1817  WBC 10.8* 8.6 9.1 7.9 8.3  NEUTROABS 9.3*  --   --   --   --   HGB 9.5* 9.7* 9.6* 8.7* 9.8*  HCT 29.3* 28.1* 28.5* 25.6* 29.4*  MCV 98.7 95.6 96.0 95.5 96.4  PLT 187 212 210 175 217    Cardiac Enzymes:  Recent Labs Lab 10/29/12 0904  TROPONINI <0.30   BNP (last 3 results)  Recent Labs  10/29/12 0904  PROBNP 6239.0*     CBG:  Recent Labs Lab 10/30/12 1220 10/30/12 1610 10/30/12 2153 10/31/12 1154 11/01/12 0823  GLUCAP 129* 170* 157* 134* 175*    Recent Results (from the past 240 hour(s))  MRSA PCR SCREENING     Status: None   Collection Time    10/29/12  5:30 PM      Result Value Range Status   MRSA by PCR NEGATIVE  NEGATIVE Final   Comment:            The GeneXpert MRSA Assay (FDA     approved for NASAL specimens     only), is one component of a     comprehensive MRSA colonization     surveillance program. It is not     intended to diagnose MRSA     infection nor to guide or     monitor treatment for     MRSA infections.  URINE CULTURE     Status: None   Collection Time    10/30/12  8:41 AM      Result Value Range Status   Specimen Description URINE, CATHETERIZED   Final   Special Requests NONE   Final   Culture  Setup Time 10/30/2012 10:05   Final   Colony Count NO GROWTH   Final   Culture NO GROWTH   Final   Report Status 10/31/2012 FINAL   Final  CULTURE, BLOOD (ROUTINE X 2)     Status: None   Collection Time    10/31/12  1:30 AM      Result Value Range Status   Specimen Description BLOOD LEFT ARM   Final   Special Requests BOTTLES DRAWN AEROBIC ONLY 10CC    Final   Culture  Setup Time 10/31/2012 09:43   Final   Culture     Final   Value:  BLOOD CULTURE RECEIVED NO GROWTH TO DATE CULTURE WILL BE HELD FOR 5 DAYS BEFORE ISSUING A FINAL NEGATIVE REPORT   Report Status PENDING   Incomplete  CULTURE, BLOOD (ROUTINE X 2)     Status: None   Collection Time    10/31/12  1:40 AM      Result Value Range Status   Specimen Description BLOOD LEFT HAND   Final   Special Requests BOTTLES DRAWN AEROBIC ONLY 5CC   Final   Culture  Setup Time 10/31/2012 09:43   Final   Culture     Final   Value:        BLOOD CULTURE RECEIVED NO GROWTH TO DATE CULTURE WILL BE HELD FOR 5 DAYS BEFORE ISSUING A FINAL NEGATIVE REPORT   Report Status PENDING   Incomplete     Studies: Dg Chest 2 View  11/01/2012   *RADIOLOGY REPORT*  Clinical Data: Fever, pulmonary edema  CHEST - 2 VIEW  Comparison: Radiograph from 10/30/2012.  Findings: The day is not significantly changed as compared to the prior examination.  Bilateral pleural effusions also persist, slightly improved as compared to the prior exam, particularly on the left., persist.  There is no pneumothorax.  Soft tissues and osseous structures are unchanged.  IMPRESSION: Persistent but decreased bilateral pleural effusions with slightly improved aeration at both lung bases as compared to 10/30/2012.   Original Report Authenticated By: Rise Mu, M.D.   Ct Angio Chest Pe W/cm &/or Wo Cm  10/29/2012   *RADIOLOGY REPORT*  Clinical Data: Shortness of breath.  Cough.  History breast cancer.  Ongoing chemotherapy.  CT ANGIOGRAPHY CHEST  Technique:  Multidetector CT imaging of the chest using the standard protocol during bolus administration of intravenous contrast. Multiplanar reconstructed images including MIPs were obtained and reviewed to evaluate the vascular anatomy.  Contrast: OMNIPAQUE IOHEXOL 350 MG/ML SOLN  Comparison: 03/27/2012 PET CT.  Findings: Respiratory motion degraded examination without medium or  large size pulmonary embolus.  Evaluation of peripheral branch is limited by the motion.  Cardiomegaly.  Small to slightly moderate sized pericardial effusion.  Prominent coronary artery calcifications.  Small to slightly moderate size right-sided pleural effusion with basilar atelectasis.  Minimal left-sided pleural effusion with basilar atelectasis.  No obvious pulmonary nodule although evaluation limited by motion.  Atherosclerotic type changes of the aorta.  No aneurysmal dilation. No obvious dissection although evaluation not performed to evaluate the aorta.  Right breast mass.  No axillary, hilar or mediastinal adenopathy.  No bony destructive lesion.  Limited imaging of the upper abdomen reveals prominent hepatic veins which may indicate increased right heart pressure.  Adrenal glands not imaged.  IMPRESSION: Respiratory motion degraded examination without medium or large size pulmonary embolus.  Evaluation of peripheral branch is limited by the motion.  Cardiomegaly.  Small to slightly moderate sized pericardial effusion.  Prominent coronary artery calcifications.  Small to slightly moderate size right-sided pleural effusion with basilar atelectasis.  Minimal left-sided pleural effusion with basilar atelectasis. Question mild congestive heart failure.  Right breast mass.  No axillary, hilar or mediastinal adenopathy.  No bony destructive lesion.  Limited imaging of the upper abdomen reveals prominent hepatic veins which may indicate increased right heart pressure.   Original Report Authenticated By: Lacy Duverney, M.D.   Ir Fluoro Guide Cv Line Right  10/30/2012   *RADIOLOGY REPORT*  Clinical Data: Diabetes mellitus  PICC LINE PLACEMENT WITH ULTRASOUND AND FLUOROSCOPIC  GUIDANCE  Fluoroscopy Time: 2 seconds.  The right arm  was prepped with chlorhexidine, draped in the usual sterile fashion using maximum barrier technique (cap and mask, sterile gown, sterile gloves, large sterile sheet, hand hygiene and  cutaneous antisepsis) and infiltrated locally with 1% Lidocaine.  Ultrasound demonstrated patency of the right basilic vein, and this was documented with an image.  Under real-time ultrasound guidance, this vein was accessed with a 21 gauge micropuncture needle and image documentation was performed.  The needle was exchanged over a guidewire for a peel-away sheath through which a five [place] single lumen PICC trimmed to 37 cm was advanced, positioned with its tip at the lower SVC/right atrial junction.  Fluoroscopy during the procedure and fluoro spot radiograph confirms appropriate catheter position.  The catheter was flushed, secured to the skin with Prolene sutures, and covered with a sterile dressing.  Complications:  None  IMPRESSION: Successful right arm PICC line placement with ultrasound and fluoroscopic guidance.  The catheter is ready for use.   Original Report Authenticated By: Jolaine Click, M.D.   Ir US Guide Vasc Access Right  10/30/2012   *RADIOLOGY REPORT*  Clinical Data: Diabetes mellitus  PICC LINE PLACEMENT WITH ULTRASOUND AND FLUOROSCOPIC  GUIDANCE  Fluoroscopy Time: 2 seconds.  The right arm was prepped with chlorhexidine, draped in the usual sterile fashion using maximum barrier technique (cap and mask, sterile gown, sterile gloves, large sterile sheet, hand hygiene and cutaneous antisepsis) and infiltrated locally with 1% Lidocaine.  Ultrasound demonstrated patency of the right basilic vein, and this was documented with an image.  Under real-time ultrasound guidance, this vein was accessed with a 21 gauge micropuncture needle and image documentation was performed.  The needle was exchanged over a guidewire for a peel-away sheath through which a five [place] single lumen PICC trimmed to 37 cm was advanced, positioned with its tip at the lower SVC/right atrial junction.  Fluoroscopy during the procedure and fluoro spot radiograph confirms appropriate catheter position.  The catheter was flushed,  secured to the skin with Prolene sutures, and covered with a sterile dressing.  Complications:  None  IMPRESSION: Successful right arm PICC line placement with ultrasound and fluoroscopic guidance.  The catheter is ready for use.   Original Report Authenticated By: Jolaine Click, M.D.   Dg Chest Port 1 View  10/30/2012   *RADIOLOGY REPORT*  Clinical Data: Fields.  Short of breath.  Congestion.  PORTABLE CHEST - 1 VIEW  Comparison: Pulmonary embolism study 10/29/2012.  Findings: Cardiomegaly and bilateral pleural effusions remain present.  Basilar atelectasis.  Airspace disease at the bases cannot be excluded.  Allowing for technical differences, no interval change compared yesterday's examination.  Left subclavian Port-A-Cath appears in good position.  IMPRESSION: Small right and small to moderate left pleural effusions with cardiomegaly and basilar atelectasis.  Allowing for technical differences, no interval change compared yesterday's CT.   Original Report Authenticated By: Andreas Newport, M.D.    Scheduled Meds: . aspirin  81 mg Oral Daily  . atorvastatin  40 mg Oral q1800  . cefTRIAXone (ROCEPHIN)  IV  1 g Intravenous Q24H  . fluPHENAZine  2.5 mg Oral QHS  . furosemide  60 mg Intravenous TID  . heparin  5,000 Units Subcutaneous Q8H  . loxapine  10 mg Oral QHS  . nortriptyline  100 mg Oral QHS  . potassium chloride  40 mEq Oral Daily  . sodium chloride  10 mL Intravenous Q12H  . sodium chloride  3 mL Intravenous Q12H  . sodium chloride  3 mL Intravenous Q12H  Continuous Infusions: . sodium chloride 10 mL/hr at 10/31/12 0800  . DOBUTamine 3 mcg/kg/min (10/31/12 1715)  . norepinephrine (LEVOPHED) Adult infusion 4 mcg/min (11/01/12 0101)    Active Problems:   Hypotension, unspecified   Pleural effusion   Pericardial effusion   Acute systolic CHF (congestive heart failure), NYHA class 4   Congestive dilated cardiomyopathy    Time spent: 40 minutes   [facility]  [facility] Pager [phone]. If 8PM-8AM, please contact night-coverage at [email or web address], password [facility] 11/01/2012, 11:05 AM  LOS: 3 days

## 2012-11-01 NOTE — Progress Notes (Signed)
Patient ID: [name], female   DOB: 07/31/1950, 62 y.o.   MRN: [id number] Advanced Heart Failure Rounding Note   Subjective:    [name] is a 62 y/o female with a PMHx of COPD, CVA, HLD, DM2, schizophrenia and R. Breast CA (triple negative) who is currently being treated with radiation and s/p chemiotherapy (Adriamycin).   She presented to [facility] ED yesterday am with SOB with a dry cough. She did not complain of any fevers, chills, n/v or edema. When she presented to the ED she had tachycardia, tachypnea and hypotension. She had a CT of the chest that r/o PE, but showed pericardial and pleural effusion.   ECHO: Reviewed echo from [facility]. Showed EF 20% with global hypokinesis, moderate RV dysfunction, small pericardial effusion.   RHC/LHC 7/18 RA mean 18  RV 45/20  PA 41/21, mean 29  PCWP mean 21  LV 95/16  AO 109/67  Oxygen saturations:  PA 40%  AO 95%  Cardiac Output (Fick) 3.5  Cardiac Index (Fick) 2.05  Cardiac Output (Thermo) 2.9  Cardiac Index (Thermo) 1.7  SVR 1799  Coronary angiography:  Left mainstem: 60-70% distal left main stenosis.  Left anterior descending (LAD): Luminal irregularities in the LAD. 80% ostial stenosis small D1.  Left circumflex (LCx): Small vessel, no significant disease.  Right coronary artery (RCA): Long 80% mid RCA stenosis.  Left ventriculography: EF 25-30%, global hypokinesis.   Yesterday after cath, milrinone was stopped and dobutamine begun given hypotension requiring norephinephrine.  SBP improved this morning.  Co-ox is 58% which is better compared to yesterday's RHC.  She has diuresed but not vigorously.  She is up in the chair and feels well today.  CVP 17.   Objective:   Weight Range:  Vital Signs:   Temp:  [98 F (36.7 C)-99 F (37.2 C)] 98 F (36.7 C) (07/19 0803) Pulse Rate:  [55-124] 91 (07/19 0803) Resp:  [13-26] 19 (07/19 0803) BP: (78-118)/(54-85) 107/73 mmHg (07/19 0803) SpO2:  [95 %-96 %] 95 % (07/19  0803) Weight:  [65.8 kg (145 lb 1 oz)] 65.8 kg (145 lb 1 oz) (07/19 0500) Last BM Date: 10/30/12  Weight change: Filed Weights   10/31/12 0800 10/31/12 1008 11/01/12 0500  Weight: 65.5 kg (144 lb 6.4 oz) 65.5 kg (144 lb 6.4 oz) 65.8 kg (145 lb 1 oz)    Intake/Output:   Intake/Output Summary (Last 24 hours) at 11/01/12 1053 Last data filed at 11/01/12 1002  Gross per 24 hour  Intake 1227.2 ml  Output   2675 ml  Net -1447.8 ml     Physical Exam:  General: Fatigues appearing. No resp difficulty  HEENT: normal  Neck: supple. JVP 12-14 cm Carotids 2+ bilat; no bruits. No lymphadenopathy or thryomegaly appreciated.  Cor: PMI nondisplaced. Mildly tachycardic, regular rate & regular rhythm. No rubs, gallops or murmurs.  Lungs: clear  Abdomen: soft, nontender, mild distention. No hepatosplenomegaly. No bruits or masses. Good bowel sounds.  Extremities: no cyanosis, clubbing, rash, edema  Neuro: alert & orientedx3, cranial nerves grossly intact. moves all 4 extremities w/o difficulty. Affect pleasant   Telemetry: ST 115-120s  Labs: Basic Metabolic Panel:  Recent Labs Lab 10/29/12 0904 10/30/12 0430 10/31/12 0345 10/31/12 1817 11/01/12 0500  NA 134* 137 136  --  134*  K 4.5 3.4* 3.1*  --  4.6  CL 99 99 98  --  97  CO2 24 27 29   --  24  GLUCOSE 168* 137* 137*  --  232*  BUN 9 15 13   --  11  CREATININE 0.86 1.13* 1.05 0.78 0.76  CALCIUM 9.2 9.0 8.3*  --  8.5  MG  --  1.1*  --   --  1.5    Liver Function Tests:  Recent Labs Lab 11/01/12 0500  AST 81*  ALT 130*  ALKPHOS 166*  BILITOT 0.3  PROT 6.5  ALBUMIN 3.0*   No results found for this basename: LIPASE, AMYLASE,  in the last 168 hours No results found for this basename: AMMONIA,  in the last 168 hours  CBC:  Recent Labs Lab 10/29/12 0904 10/29/12 2345 10/30/12 0430 10/31/12 0345 10/31/12 1817  WBC 10.8* 8.6 9.1 7.9 8.3  NEUTROABS 9.3*  --   --   --   --   HGB 9.5* 9.7* 9.6* 8.7* 9.8*  HCT 29.3*  28.1* 28.5* 25.6* 29.4*  MCV 98.7 95.6 96.0 95.5 96.4  PLT 187 212 210 175 217    Cardiac Enzymes:  Recent Labs Lab 10/29/12 0904  TROPONINI <0.30    BNP: BNP (last 3 results)  Recent Labs  10/29/12 0904  PROBNP 6239.0*     Other results:  EKG:   Imaging: Dg Chest 2 View  11/01/2012   *RADIOLOGY REPORT*  Clinical Data: Fever, pulmonary edema  CHEST - 2 VIEW  Comparison: Radiograph from 10/30/2012.  Findings: The day is not significantly changed as compared to the prior examination.  Bilateral pleural effusions also persist, slightly improved as compared to the prior exam, particularly on the left., persist.  There is no pneumothorax.  Soft tissues and osseous structures are unchanged.  IMPRESSION: Persistent but decreased bilateral pleural effusions with slightly improved aeration at both lung bases as compared to 10/30/2012.   Original Report Authenticated By: Rise Mu, M.D.   Ir Fluoro Guide Cv Line Right  10/30/2012   *RADIOLOGY REPORT*  Clinical Data: Diabetes mellitus  PICC LINE PLACEMENT WITH ULTRASOUND AND FLUOROSCOPIC  GUIDANCE  Fluoroscopy Time: 2 seconds.  The right arm was prepped with chlorhexidine, draped in the usual sterile fashion using maximum barrier technique (cap and mask, sterile gown, sterile gloves, large sterile sheet, hand hygiene and cutaneous antisepsis) and infiltrated locally with 1% Lidocaine.  Ultrasound demonstrated patency of the right basilic vein, and this was documented with an image.  Under real-time ultrasound guidance, this vein was accessed with a 21 gauge micropuncture needle and image documentation was performed.  The needle was exchanged over a guidewire for a peel-away sheath through which a five [place] single lumen PICC trimmed to 37 cm was advanced, positioned with its tip at the lower SVC/right atrial junction.  Fluoroscopy during the procedure and fluoro spot radiograph confirms appropriate catheter position.  The catheter was  flushed, secured to the skin with Prolene sutures, and covered with a sterile dressing.  Complications:  None  IMPRESSION: Successful right arm PICC line placement with ultrasound and fluoroscopic guidance.  The catheter is ready for use.   Original Report Authenticated By: Jolaine Click, M.D.   Ir US Guide Vasc Access Right  10/30/2012   *RADIOLOGY REPORT*  Clinical Data: Diabetes mellitus  PICC LINE PLACEMENT WITH ULTRASOUND AND FLUOROSCOPIC  GUIDANCE  Fluoroscopy Time: 2 seconds.  The right arm was prepped with chlorhexidine, draped in the usual sterile fashion using maximum barrier technique (cap and mask, sterile gown, sterile gloves, large sterile sheet, hand hygiene and cutaneous antisepsis) and infiltrated locally with 1% Lidocaine.  Ultrasound demonstrated patency of the right basilic vein,  and this was documented with an image.  Under real-time ultrasound guidance, this vein was accessed with a 21 gauge micropuncture needle and image documentation was performed.  The needle was exchanged over a guidewire for a peel-away sheath through which a five [place] single lumen PICC trimmed to 37 cm was advanced, positioned with its tip at the lower SVC/right atrial junction.  Fluoroscopy during the procedure and fluoro spot radiograph confirms appropriate catheter position.  The catheter was flushed, secured to the skin with Prolene sutures, and covered with a sterile dressing.  Complications:  None  IMPRESSION: Successful right arm PICC line placement with ultrasound and fluoroscopic guidance.  The catheter is ready for use.   Original Report Authenticated By: Jolaine Click, M.D.     Medications:     Scheduled Medications: . aspirin  81 mg Oral Daily  . atorvastatin  40 mg Oral q1800  . cefTRIAXone (ROCEPHIN)  IV  1 g Intravenous Q24H  . fluPHENAZine  2.5 mg Oral QHS  . furosemide  60 mg Intravenous TID  . heparin  5,000 Units Subcutaneous Q8H  . loxapine  10 mg Oral QHS  . nortriptyline  100 mg Oral  QHS  . potassium chloride  40 mEq Oral Daily  . sodium chloride  10 mL Intravenous Q12H  . sodium chloride  3 mL Intravenous Q12H  . sodium chloride  3 mL Intravenous Q12H    Infusions: . sodium chloride 10 mL/hr at 10/31/12 0800  . DOBUTamine 3 mcg/kg/min (10/31/12 1715)  . norepinephrine (LEVOPHED) Adult infusion 4 mcg/min (11/01/12 0101)    PRN Medications: sodium chloride, sodium chloride, sodium chloride, acetaminophen, ondansetron (ZOFRAN) IV, sodium chloride, sodium chloride, sodium chloride   Assessment:   1) Acute systolic HF, EF 20%  - Suspect adriamycin toxicity 2) Breast CA  3) DM2  4) hypokalemia  5) hypomagnesia  5) Mild leukocytosis  6) Schizophrenia 7) CAD: 50-60% LM, 80% RCA on [facility] 10/31/12  Plan/Discussion:    1) Acute systolic CHF: EF 40% by echo with RV failure.  I think her degree of LV dysfunction is significantly out of proportion to degree of CAD, suspect Adriamycin toxicity is the main issue here.  RHC suggested significant RHC with RA pressure elevated out of proportion to PCWP.  CI remained low on milrinone.  Milrinone stopped given hypotension and low output (norepinephrine had been started at low dose) and dobutamine was begun.  Co-ox shows some improvement this morning at 58%. She has diuresed but not aggressively. - I am going to increase Lasix to 80 mg IV every 8 hours - No b-blocker/ACEI at this time due to shock/hypotension - portacath saline locked, using PICC now - Guarded prognosis: she is not going to be a good candidate for advanced therapies with schizophrenia and breast cancer, also RV dysfunction would make VAD difficult.  I would like to avoid IABP also if possible because we do not have a therapy for the IABP to be a bridge towards.  She may require home dobutamine.  2) ID: Has had low grade fever.  Was started on ceftriaxone empirically for possibility of PNA.  3) Diabetes: Per [facility]. 4) CAD: Significant CAD on cath yesterday.  I do  not think this caused her cardiomyopathy but if she recovers, she will need further evaluation of LM by IVUS. Continue ASA and statin.   Length of Stay: 3   Marca Ancona 11/01/2012, 10:53 AM  Advanced Heart Failure Team Pager [phone] (M-F; 7a - 4p)  Please contact [facility] for night-coverage after hours (4p -7a ) and weekends on [email or web address]

## 2012-11-02 LAB — CARBOXYHEMOGLOBIN
Carboxyhemoglobin: 1.7 % — ABNORMAL HIGH (ref 0.5–1.5)
O2 Saturation: 47.2 %
O2 Saturation: 50.3 %
Total hemoglobin: 9.4 g/dL — ABNORMAL LOW (ref 12.0–16.0)
Total hemoglobin: 10.1 g/dL — ABNORMAL LOW (ref 12.0–16.0)

## 2012-11-02 LAB — COMPREHENSIVE METABOLIC PANEL
AST: 45 U/L — ABNORMAL HIGH (ref 0–37)
Albumin: 3.1 g/dL — ABNORMAL LOW (ref 3.5–5.2)
Alkaline Phosphatase: 161 U/L — ABNORMAL HIGH (ref 39–117)
BUN: 12 mg/dL (ref 6–23)
CO2: 26 mEq/L (ref 19–32)
Chloride: 98 mEq/L (ref 96–112)
Creatinine, Ser: 0.73 mg/dL (ref 0.50–1.10)
GFR calc non Af Amer: >90 mL/min (ref >90)
Potassium: 3.5 mEq/L (ref 3.5–5.1)
Total Bilirubin: 0.3 mg/dL (ref 0.3–1.2)

## 2012-11-02 LAB — CBC
HCT: 28.7 % — ABNORMAL LOW (ref 36.0–46.0)
MCV: 97 fL (ref 78.0–100.0)
RBC: 2.96 MIL/uL — ABNORMAL LOW (ref 3.87–5.11)
RDW: 15.2 % (ref 11.5–15.5)
WBC: 7.8 10*3/uL (ref 4.0–10.5)

## 2012-11-02 LAB — GLUCOSE, CAPILLARY: Glucose-Capillary: 150 mg/dL — ABNORMAL HIGH (ref 70–99)

## 2012-11-02 NOTE — Progress Notes (Signed)
[facility] PROGRESS NOTE  [name] WUJ:[id number] DOB: 1950/09/23 DOA: 10/29/2012 PCP: [name], MD  Assessment/Plan: Active Problems:   Hypotension, unspecified   Pleural effusion   Pericardial effusion   Acute systolic CHF (congestive heart failure), NYHA class 4   Congestive dilated cardiomyopathy    Acute systolic heart failure  This is due to Adriamycin toxicity  Heart failure team following  Diurese as per the heart failure team  Hold off on ACE inhibitor  LHC/RHC 60-70% distal left main, 80% long mid RCA  RHC suggested significant RHC with RA pressure elevated out of proportion to PCWP  increase Lasix to 80 mg IV every 8 hours  No b-blocker/ACEI at this time due to shock/hypotension  - portacath saline locked, using PICC now  Currently on dobutamine and levophed  She may require home dobutamine.   Hypomagnesemia replete   Shortness of breath  CT scan did not show any pulmonary embolism  Given fever, CXR shows Persistent but decreased bilateral pleural effusions with slightly  improved aeration at both lung bases as compared to 10/30/2012  Follow blood culture   Rocephin for uti   Anemia of chronic disease  Follow CBC   Code Status: full  Family Communication: family updated about patient's clinical progress  Disposition Plan: Per cardiology  Brief narrative:  [name] is a 62 y/o female with a PMHx of COPD, CVA, HLD, DM2, schizophrenia and R. Breast CA (triple negative) who is currently being treated with radiation and s/p chemiotherapy (Adriamycin).  She presented to [facility] ED yesterday am with SOB with a dry cough. She did not complain of any fevers, chills, n/v or edema. When she presented to the ED she had tachycardia, tachypnea and hypotension. She had a CT of the chest that r/o PE, but showed pericardial and pleural effusion.  ECHO: Reviewed echo from [facility]. Showed EF 20% with global hypokinesis, moderate RV  dysfunction, trivial pericardial effusion.  On milrinone along with 80 mg IV lasix Q 8 hrs. Weight up 7 lbs, but weighing in bed. 24 hr I/O -2.3 liters. CVP 13 and awaiting co-ox. Patient denies SOB, orthopnea or CP. Fever spiked to 100.8, and was having some mild confusion last night, blood cx ordered. SBP over night in the 80s.   Consultants:  Cardiology  Critical care  Procedures:  PICC line placement  Antibiotics:  None  HPI/Subjective:  Denies any chest pain or shortness of breath, soft blood pressure   Objective: Filed Vitals:   11/01/12 2347 11/02/12 0351 11/02/12 0436 11/02/12 0736  BP: 118/65 106/69  109/93  Pulse:    118  Temp: 99.3 F (37.4 C) 99.5 F (37.5 C)  98.7 F (37.1 C)  TempSrc: Oral Oral  Oral  Resp: 24 18  23   Height:      Weight:   65.2 kg (143 lb 11.8 oz)   SpO2: 88% 95%  92%    Intake/Output Summary (Last 24 hours) at 11/02/12 0937 Last data filed at 11/02/12 0900  Gross per 24 hour  Intake 1420.6 ml  Output   3225 ml  Net -1804.4 ml    Exam:  HENT:  Head: Atraumatic.  Nose: Nose normal.  Mouth/Throat: Oropharynx is clear and moist.  Eyes: Conjunctivae are normal. Pupils are equal, round, and reactive to light. No scleral icterus.  Neck: Neck supple. No tracheal deviation present.  Cardiovascular: Normal rate, regular rhythm, normal heart sounds and intact distal pulses.  Pulmonary/Chest: Effort normal and breath  sounds normal. No respiratory distress.  Abdominal: Soft. Normal appearance and bowel sounds are normal. She exhibits no distension. There is no tenderness.  Musculoskeletal: She exhibits no edema and no tenderness.  Neurological: She is alert. No cranial nerve deficit.    Data Reviewed: Basic Metabolic Panel:  Recent Labs Lab 10/29/12 0904 10/30/12 0430 10/31/12 0345 10/31/12 1817 11/01/12 0500 11/02/12 0430  NA 134* 137 136  --  134* 136  K 4.5 3.4* 3.1*  --  4.6 3.5  CL 99 99 98  --  97 98  CO2 24 27 29   --  24 26   GLUCOSE 168* 137* 137*  --  232* 232*  BUN 9 15 13   --  11 12  CREATININE 0.86 1.13* 1.05 0.78 0.76 0.73  CALCIUM 9.2 9.0 8.3*  --  8.5 8.5  MG  --  1.1*  --   --  1.5  --     Liver Function Tests:  Recent Labs Lab 11/01/12 0500 11/02/12 0430  AST 81* 45*  ALT 130* 99*  ALKPHOS 166* 161*  BILITOT 0.3 0.3  PROT 6.5 6.6  ALBUMIN 3.0* 3.1*   No results found for this basename: LIPASE, AMYLASE,  in the last 168 hours No results found for this basename: AMMONIA,  in the last 168 hours  CBC:  Recent Labs Lab 10/29/12 0904 10/29/12 2345 10/30/12 0430 10/31/12 0345 10/31/12 1817 11/02/12 0430  WBC 10.8* 8.6 9.1 7.9 8.3 7.8  NEUTROABS 9.3*  --   --   --   --   --   HGB 9.5* 9.7* 9.6* 8.7* 9.8* 9.3*  HCT 29.3* 28.1* 28.5* 25.6* 29.4* 28.7*  MCV 98.7 95.6 96.0 95.5 96.4 97.0  PLT 187 212 210 175 217 252    Cardiac Enzymes:  Recent Labs Lab 10/29/12 0904  TROPONINI <0.30   BNP (last 3 results)  Recent Labs  10/29/12 0904  PROBNP 6239.0*     CBG:  Recent Labs Lab 11/01/12 0823 11/01/12 1140 11/01/12 1720 11/01/12 2205 11/02/12 0742  GLUCAP 175* 188* 143* 191* 202*    Recent Results (from the past 240 hour(s))  MRSA PCR SCREENING     Status: None   Collection Time    10/29/12  5:30 PM      Result Value Range Status   MRSA by PCR NEGATIVE  NEGATIVE Final   Comment:            The GeneXpert MRSA Assay (FDA     approved for NASAL specimens     only), is one component of a     comprehensive MRSA colonization     surveillance program. It is not     intended to diagnose MRSA     infection nor to guide or     monitor treatment for     MRSA infections.  URINE CULTURE     Status: None   Collection Time    10/30/12  8:41 AM      Result Value Range Status   Specimen Description URINE, CATHETERIZED   Final   Special Requests NONE   Final   Culture  Setup Time 10/30/2012 10:05   Final   Colony Count NO GROWTH   Final   Culture NO GROWTH   Final    Report Status 10/31/2012 FINAL   Final  CULTURE, BLOOD (ROUTINE X 2)     Status: None   Collection Time    10/31/12  1:30 AM  Result Value Range Status   Specimen Description BLOOD LEFT ARM   Final   Special Requests BOTTLES DRAWN AEROBIC ONLY 10CC   Final   Culture  Setup Time 10/31/2012 09:43   Final   Culture     Final   Value:        BLOOD CULTURE RECEIVED NO GROWTH TO DATE CULTURE WILL BE HELD FOR 5 DAYS BEFORE ISSUING A FINAL NEGATIVE REPORT   Report Status PENDING   Incomplete  CULTURE, BLOOD (ROUTINE X 2)     Status: None   Collection Time    10/31/12  1:40 AM      Result Value Range Status   Specimen Description BLOOD LEFT HAND   Final   Special Requests BOTTLES DRAWN AEROBIC ONLY 5CC   Final   Culture  Setup Time 10/31/2012 09:43   Final   Culture     Final   Value:        BLOOD CULTURE RECEIVED NO GROWTH TO DATE CULTURE WILL BE HELD FOR 5 DAYS BEFORE ISSUING A FINAL NEGATIVE REPORT   Report Status PENDING   Incomplete     Studies: Dg Chest 2 View  11/01/2012   *RADIOLOGY REPORT*  Clinical Data: Fever, pulmonary edema  CHEST - 2 VIEW  Comparison: Radiograph from 10/30/2012.  Findings: The day is not significantly changed as compared to the prior examination.  Bilateral pleural effusions also persist, slightly improved as compared to the prior exam, particularly on the left., persist.  There is no pneumothorax.  Soft tissues and osseous structures are unchanged.  IMPRESSION: Persistent but decreased bilateral pleural effusions with slightly improved aeration at both lung bases as compared to 10/30/2012.   Original Report Authenticated By: Rise Mu, M.D.   Ct Angio Chest Pe W/cm &/or Wo Cm  10/29/2012   *RADIOLOGY REPORT*  Clinical Data: Shortness of breath.  Cough.  History breast cancer.  Ongoing chemotherapy.  CT ANGIOGRAPHY CHEST  Technique:  Multidetector CT imaging of the chest using the standard protocol during bolus administration of intravenous contrast.  Multiplanar reconstructed images including MIPs were obtained and reviewed to evaluate the vascular anatomy.  Contrast: OMNIPAQUE IOHEXOL 350 MG/ML SOLN  Comparison: 03/27/2012 PET CT.  Findings: Respiratory motion degraded examination without medium or large size pulmonary embolus.  Evaluation of peripheral branch is limited by the motion.  Cardiomegaly.  Small to slightly moderate sized pericardial effusion.  Prominent coronary artery calcifications.  Small to slightly moderate size right-sided pleural effusion with basilar atelectasis.  Minimal left-sided pleural effusion with basilar atelectasis.  No obvious pulmonary nodule although evaluation limited by motion.  Atherosclerotic type changes of the aorta.  No aneurysmal dilation. No obvious dissection although evaluation not performed to evaluate the aorta.  Right breast mass.  No axillary, hilar or mediastinal adenopathy.  No bony destructive lesion.  Limited imaging of the upper abdomen reveals prominent hepatic veins which may indicate increased right heart pressure.  Adrenal glands not imaged.  IMPRESSION: Respiratory motion degraded examination without medium or large size pulmonary embolus.  Evaluation of peripheral branch is limited by the motion.  Cardiomegaly.  Small to slightly moderate sized pericardial effusion.  Prominent coronary artery calcifications.  Small to slightly moderate size right-sided pleural effusion with basilar atelectasis.  Minimal left-sided pleural effusion with basilar atelectasis. Question mild congestive heart failure.  Right breast mass.  No axillary, hilar or mediastinal adenopathy.  No bony destructive lesion.  Limited imaging of the upper abdomen reveals prominent hepatic  veins which may indicate increased right heart pressure.   Original Report Authenticated By: Lacy Duverney, M.D.   Ir Fluoro Guide Cv Line Right  10/30/2012   *RADIOLOGY REPORT*  Clinical Data: Diabetes mellitus  PICC LINE PLACEMENT WITH  ULTRASOUND AND FLUOROSCOPIC  GUIDANCE  Fluoroscopy Time: 2 seconds.  The right arm was prepped with chlorhexidine, draped in the usual sterile fashion using maximum barrier technique (cap and mask, sterile gown, sterile gloves, large sterile sheet, hand hygiene and cutaneous antisepsis) and infiltrated locally with 1% Lidocaine.  Ultrasound demonstrated patency of the right basilic vein, and this was documented with an image.  Under real-time ultrasound guidance, this vein was accessed with a 21 gauge micropuncture needle and image documentation was performed.  The needle was exchanged over a guidewire for a peel-away sheath through which a five [place] single lumen PICC trimmed to 37 cm was advanced, positioned with its tip at the lower SVC/right atrial junction.  Fluoroscopy during the procedure and fluoro spot radiograph confirms appropriate catheter position.  The catheter was flushed, secured to the skin with Prolene sutures, and covered with a sterile dressing.  Complications:  None  IMPRESSION: Successful right arm PICC line placement with ultrasound and fluoroscopic guidance.  The catheter is ready for use.   Original Report Authenticated By: Jolaine Click, M.D.   Ir US Guide Vasc Access Right  10/30/2012   *RADIOLOGY REPORT*  Clinical Data: Diabetes mellitus  PICC LINE PLACEMENT WITH ULTRASOUND AND FLUOROSCOPIC  GUIDANCE  Fluoroscopy Time: 2 seconds.  The right arm was prepped with chlorhexidine, draped in the usual sterile fashion using maximum barrier technique (cap and mask, sterile gown, sterile gloves, large sterile sheet, hand hygiene and cutaneous antisepsis) and infiltrated locally with 1% Lidocaine.  Ultrasound demonstrated patency of the right basilic vein, and this was documented with an image.  Under real-time ultrasound guidance, this vein was accessed with a 21 gauge micropuncture needle and image documentation was performed.  The needle was exchanged over a guidewire for a peel-away sheath  through which a five [place] single lumen PICC trimmed to 37 cm was advanced, positioned with its tip at the lower SVC/right atrial junction.  Fluoroscopy during the procedure and fluoro spot radiograph confirms appropriate catheter position.  The catheter was flushed, secured to the skin with Prolene sutures, and covered with a sterile dressing.  Complications:  None  IMPRESSION: Successful right arm PICC line placement with ultrasound and fluoroscopic guidance.  The catheter is ready for use.   Original Report Authenticated By: Jolaine Click, M.D.   Dg Chest Port 1 View  10/30/2012   *RADIOLOGY REPORT*  Clinical Data: Fields.  Short of breath.  Congestion.  PORTABLE CHEST - 1 VIEW  Comparison: Pulmonary embolism study 10/29/2012.  Findings: Cardiomegaly and bilateral pleural effusions remain present.  Basilar atelectasis.  Airspace disease at the bases cannot be excluded.  Allowing for technical differences, no interval change compared yesterday's examination.  Left subclavian Port-A-Cath appears in good position.  IMPRESSION: Small right and small to moderate left pleural effusions with cardiomegaly and basilar atelectasis.  Allowing for technical differences, no interval change compared yesterday's CT.   Original Report Authenticated By: Andreas Newport, M.D.    Scheduled Meds: . aspirin  81 mg Oral Daily  . atorvastatin  40 mg Oral q1800  . cefTRIAXone (ROCEPHIN)  IV  1 g Intravenous Q24H  . fluPHENAZine  2.5 mg Oral QHS  . furosemide  60 mg Intravenous TID  . heparin  5,000  Units Subcutaneous Q8H  . loxapine  10 mg Oral QHS  . nortriptyline  100 mg Oral QHS  . potassium chloride  40 mEq Oral Daily  . sodium chloride  10 mL Intravenous Q12H  . sodium chloride  3 mL Intravenous Q12H   Continuous Infusions: . sodium chloride 10 mL/hr at 10/31/12 0800  . DOBUTamine 4 mcg/kg/min (11/01/12 1108)  . norepinephrine (LEVOPHED) Adult infusion 2 mcg/min (11/01/12 2322)    Active Problems:    Hypotension, unspecified   Pleural effusion   Pericardial effusion   Acute systolic CHF (congestive heart failure), NYHA class 4   Congestive dilated cardiomyopathy    Time spent: 40 minutes   [facility]  [facility] Pager [phone]. If 8PM-8AM, please contact night-coverage at [email or web address], password [facility] 11/02/2012, 9:37 AM  LOS: 4 days

## 2012-11-02 NOTE — Progress Notes (Signed)
Patient ID: SHONDRIKA HOQUE, female   DOB: [birth year], 62 y.o.   MRN: [id number] Advanced Heart Failure Rounding Note   Subjective:    Gal Smolinski is a 62 y/o female with a PMHx of COPD, CVA, HLD, DM2, schizophrenia and R. Breast CA (triple negative) who is currently being treated with radiation and s/p chemiotherapy (Adriamycin).   She presented to [facility] ED yesterday am with SOB with a dry cough. She did not complain of any fevers, chills, n/v or edema. When she presented to the ED she had tachycardia, tachypnea and hypotension. She had a CT of the chest that r/o PE, but showed pericardial and pleural effusion.   ECHO: Reviewed echo from [facility]. Showed EF 20% with global hypokinesis, moderate RV dysfunction, small pericardial effusion.   RHC/LHC 7/18 RA mean 18  RV 45/20  PA 41/21, mean 29  PCWP mean 21  LV 95/16  AO 109/67  Oxygen saturations:  PA 40%  AO 95%  Cardiac Output (Fick) 3.5  Cardiac Index (Fick) 2.05  Cardiac Output (Thermo) 2.9  Cardiac Index (Thermo) 1.7  SVR 1799  Coronary angiography:  Left mainstem: 60-70% distal left main stenosis.  Left anterior descending (LAD): Luminal irregularities in the LAD. 80% ostial stenosis small D1.  Left circumflex (LCx): Small vessel, no significant disease.  Right coronary artery (RCA): Long 80% mid RCA stenosis.  Left ventriculography: EF 25-30%, global hypokinesis.   Friday after cath, milrinone was stopped and dobutamine begun given hypotension requiring norephinephrine.  SBP improved this morning, norepinephrine titrated down this morning.  Co-ox is lower again this morning, down to 47%.  She is up in the chair and feels well today.  CVP 13.  Objective:   Weight Range:  Vital Signs:   Temp:  [98.5 F (36.9 C)-99.7 F (37.6 C)] 98.7 F (37.1 C) (07/20 0736) Pulse Rate:  [57-122] 118 (07/20 0736) Resp:  [17-37] 23 (07/20 0736) BP: (87-118)/(51-93) 109/93 mmHg (07/20 0736) SpO2:  [88 %-97 %] 92 % (07/20  0736) Weight:  [65.2 kg (143 lb 11.8 oz)] 65.2 kg (143 lb 11.8 oz) (07/20 0436) Last BM Date: 10/30/12  Weight change: Filed Weights   10/31/12 1008 11/01/12 0500 11/02/12 0436  Weight: 65.5 kg (144 lb 6.4 oz) 65.8 kg (145 lb 1 oz) 65.2 kg (143 lb 11.8 oz)    Intake/Output:   Intake/Output Summary (Last 24 hours) at 11/02/12 1015 Last data filed at 11/02/12 0941  Gross per 24 hour  Intake 1262.7 ml  Output   3125 ml  Net -1862.3 ml     Physical Exam:  General: Fatigues appearing. No resp difficulty  HEENT: normal  Neck: supple. JVP 12-14 cm Carotids 2+ bilat; no bruits. No lymphadenopathy or thryomegaly appreciated.  Cor: PMI nondisplaced. Mildly tachycardic, regular rate & regular rhythm. No rubs, gallops or murmurs.  Lungs: clear  Abdomen: soft, nontender, mild distention. No hepatosplenomegaly. No bruits or masses. Good bowel sounds.  Extremities: no cyanosis, clubbing, rash, edema  Neuro: alert & orientedx3, cranial nerves grossly intact. moves all 4 extremities w/o difficulty. Affect pleasant   Telemetry: ST 110s  Labs: Basic Metabolic Panel:  Recent Labs Lab 10/29/12 0904 10/30/12 0430 10/31/12 0345 10/31/12 1817 11/01/12 0500 11/02/12 0430  NA 134* 137 136  --  134* 136  K 4.5 3.4* 3.1*  --  4.6 3.5  CL 99 99 98  --  97 98  CO2 24 27 29   --  24 26  GLUCOSE 168* 137*  137*  --  232* 232*  BUN 9 15 13   --  11 12  CREATININE 0.86 1.13* 1.05 0.78 0.76 0.73  CALCIUM 9.2 9.0 8.3*  --  8.5 8.5  MG  --  1.1*  --   --  1.5  --     Liver Function Tests:  Recent Labs Lab 11/01/12 0500 11/02/12 0430  AST 81* 45*  ALT 130* 99*  ALKPHOS 166* 161*  BILITOT 0.3 0.3  PROT 6.5 6.6  ALBUMIN 3.0* 3.1*   No results found for this basename: LIPASE, AMYLASE,  in the last 168 hours No results found for this basename: AMMONIA,  in the last 168 hours  CBC:  Recent Labs Lab 10/29/12 0904 10/29/12 2345 10/30/12 0430 10/31/12 0345 10/31/12 1817 11/02/12 0430   WBC 10.8* 8.6 9.1 7.9 8.3 7.8  NEUTROABS 9.3*  --   --   --   --   --   HGB 9.5* 9.7* 9.6* 8.7* 9.8* 9.3*  HCT 29.3* 28.1* 28.5* 25.6* 29.4* 28.7*  MCV 98.7 95.6 96.0 95.5 96.4 97.0  PLT 187 212 210 175 217 252    Cardiac Enzymes:  Recent Labs Lab 10/29/12 0904  TROPONINI <0.30    BNP: BNP (last 3 results)  Recent Labs  10/29/12 0904  PROBNP 6239.0*     Other results:  EKG:   Imaging: Dg Chest 2 View  11/01/2012   *RADIOLOGY REPORT*  Clinical Data: Fever, pulmonary edema  CHEST - 2 VIEW  Comparison: Radiograph from 10/30/2012.  Findings: The day is not significantly changed as compared to the prior examination.  Bilateral pleural effusions also persist, slightly improved as compared to the prior exam, particularly on the left., persist.  There is no pneumothorax.  Soft tissues and osseous structures are unchanged.  IMPRESSION: Persistent but decreased bilateral pleural effusions with slightly improved aeration at both lung bases as compared to 10/30/2012.   Original Report Authenticated By: Rise Mu, M.D.     Medications:     Scheduled Medications: . aspirin  81 mg Oral Daily  . atorvastatin  40 mg Oral q1800  . cefTRIAXone (ROCEPHIN)  IV  1 g Intravenous Q24H  . fluPHENAZine  2.5 mg Oral QHS  . furosemide  60 mg Intravenous TID  . heparin  5,000 Units Subcutaneous Q8H  . loxapine  10 mg Oral QHS  . nortriptyline  100 mg Oral QHS  . potassium chloride  40 mEq Oral Daily  . sodium chloride  10 mL Intravenous Q12H  . sodium chloride  3 mL Intravenous Q12H    Infusions: . sodium chloride 10 mL/hr at 10/31/12 0800  . DOBUTamine 4 mcg/kg/min (11/01/12 1108)  . norepinephrine (LEVOPHED) Adult infusion 2 mcg/min (11/01/12 2322)    PRN Medications: sodium chloride, sodium chloride, sodium chloride, acetaminophen, ondansetron (ZOFRAN) IV, sodium chloride, sodium chloride, sodium chloride   Assessment:   1) Acute systolic HF, EF 20%  - Suspect  adriamycin toxicity 2) Breast CA  3) DM2  4) hypokalemia  5) hypomagnesia  5) Mild leukocytosis  6) Schizophrenia 7) CAD: 50-60% LM, 80% RCA on [facility] 10/31/12  Plan/Discussion:    1) Acute systolic CHF: EF 30% by echo with RV failure.  I think her degree of LV dysfunction is significantly out of proportion to degree of CAD, suspect Adriamycin toxicity is the main issue here.  RHC suggested significant RHC with RA pressure elevated out of proportion to PCWP.  CI remained low on milrinone.  Milrinone stopped  given hypotension and low output (norepinephrine had been started at low dose) and dobutamine was begun.  Co-ox improved yesterday but is back down to 47% today. - CVP remains elevated, continue Lasix 80 mg IV every 8 hrs (good diuresis yesterday).  - Increase dobutamine to 5, stop norepinephrine (good BP).  - No b-blocker/ACEI at this time due to shock/hypotension - portacath saline locked, using PICC now - Guarded prognosis: she is not going to be a good candidate for advanced therapies with schizophrenia and breast cancer, also RV dysfunction would make VAD difficult.  I would like to avoid IABP also if possible because we do not have a therapy for the IABP to be a bridge towards.  She may require home dobutamine.  2) ID: Has had low grade fever.  Was started on ceftriaxone empirically for possibility of PNA.  3) Diabetes: Per [facility]. 4) CAD: Significant CAD on cath Friday.  I do not think this caused her cardiomyopathy but if she recovers, she will need further evaluation of LM by IVUS. Continue ASA and statin.   Length of Stay: 4   Milinda Sweeney Shirlee Latch 11/02/2012, 10:15 AM  Advanced Heart Failure Team Pager [phone] (M-F; 7a - 4p)  Please contact [facility] for night-coverage after hours (4p -7a ) and weekends on [email or web address]

## 2012-11-03 ENCOUNTER — Other Ambulatory Visit (HOSPITAL_COMMUNITY): Payer: Medicare Other

## 2012-11-03 LAB — CBC
Hemoglobin: 10 g/dL — ABNORMAL LOW (ref 12.0–15.0)
MCH: 31.8 pg (ref 26.0–34.0)
MCHC: 33 g/dL (ref 30.0–36.0)
MCV: 96.5 fL (ref 78.0–100.0)
Platelets: 263 10*3/uL (ref 150–400)

## 2012-11-03 LAB — CARBOXYHEMOGLOBIN
Carboxyhemoglobin: 1.5 % (ref 0.5–1.5)
O2 Saturation: 48.6 %

## 2012-11-03 LAB — BASIC METABOLIC PANEL
GFR calc Af Amer: >90 mL/min (ref >90)
GFR calc non Af Amer: >90 mL/min (ref >90)
Potassium: 3.4 mEq/L — ABNORMAL LOW (ref 3.5–5.1)
Sodium: 139 mEq/L (ref 135–145)

## 2012-11-03 LAB — GLUCOSE, CAPILLARY
Glucose-Capillary: 156 mg/dL — ABNORMAL HIGH (ref 70–99)
Glucose-Capillary: 175 mg/dL — ABNORMAL HIGH (ref 70–99)
Glucose-Capillary: 121 mg/dL — ABNORMAL HIGH (ref 70–99)

## 2012-11-03 MED ORDER — DIGOXIN 125 MCG PO TABS
0.1250 mg | ORAL_TABLET | Freq: Every day | ORAL | Status: DC
  Administered 2012-11-04 – 2012-11-07 (×4): 0.125 mg via ORAL
  Filled 2012-11-03 (×4): qty 1

## 2012-11-03 MED ORDER — POLYVINYL ALCOHOL 1.4 % OP SOLN
1.0000 [drp] | Freq: Two times a day (BID) | OPHTHALMIC | Status: DC | PRN
  Filled 2012-11-03: qty 15

## 2012-11-03 MED ORDER — TRAZODONE HCL 150 MG PO TABS
150.0000 mg | ORAL_TABLET | Freq: Every day | ORAL | Status: DC
  Administered 2012-11-03 – 2012-11-06 (×4): 150 mg via ORAL
  Filled 2012-11-03 (×5): qty 1

## 2012-11-03 MED ORDER — POTASSIUM CHLORIDE CRYS ER 20 MEQ PO TBCR
40.0000 meq | EXTENDED_RELEASE_TABLET | Freq: Two times a day (BID) | ORAL | Status: DC
  Administered 2012-11-03 – 2012-11-04 (×3): 40 meq via ORAL
  Filled 2012-11-03 (×2): qty 1
  Filled 2012-11-03 (×4): qty 2

## 2012-11-03 MED ORDER — MAGIC MOUTHWASH
5.0000 mL | Freq: Four times a day (QID) | ORAL | Status: DC | PRN
  Filled 2012-11-03: qty 5

## 2012-11-03 MED ORDER — TRAZODONE HCL 150 MG PO TABS
150.0000 mg | ORAL_TABLET | Freq: Every evening | ORAL | Status: DC | PRN
  Filled 2012-11-03: qty 1

## 2012-11-03 MED ORDER — SPIRONOLACTONE 12.5 MG HALF TABLET
12.5000 mg | ORAL_TABLET | Freq: Every day | ORAL | Status: DC
  Administered 2012-11-03 – 2012-11-07 (×5): 12.5 mg via ORAL
  Filled 2012-11-03 (×5): qty 1

## 2012-11-03 MED ORDER — DOCUSATE SODIUM 100 MG PO CAPS
200.0000 mg | ORAL_CAPSULE | Freq: Every day | ORAL | Status: DC
  Administered 2012-11-03 – 2012-11-04 (×2): 200 mg via ORAL
  Filled 2012-11-03 (×5): qty 2

## 2012-11-03 MED ORDER — FUROSEMIDE 10 MG/ML IJ SOLN
60.0000 mg | Freq: Three times a day (TID) | INTRAMUSCULAR | Status: DC
  Administered 2012-11-03 (×2): 60 mg via INTRAVENOUS
  Filled 2012-11-03 (×3): qty 6

## 2012-11-03 MED ORDER — NORTRIPTYLINE HCL 25 MG PO CAPS: 100.0000 mg | ORAL_CAPSULE | Freq: Every day | ORAL | Status: DC

## 2012-11-03 MED ORDER — RAMELTEON 8 MG PO TABS
8.0000 mg | ORAL_TABLET | Freq: Every day | ORAL | Status: DC
  Administered 2012-11-03 – 2012-11-06 (×4): 8 mg via ORAL
  Filled 2012-11-03 (×5): qty 1

## 2012-11-03 MED ORDER — PROPYLENE GLYCOL 0.6 % OP SOLN: 1.0000 [drp] | Freq: Two times a day (BID) | OPHTHALMIC | Status: DC | PRN

## 2012-11-03 MED ORDER — POTASSIUM CHLORIDE CRYS ER 20 MEQ PO TBCR
40.0000 meq | EXTENDED_RELEASE_TABLET | Freq: Once | ORAL | Status: AC
  Administered 2012-11-03: 40 meq via ORAL
  Filled 2012-11-03: qty 2

## 2012-11-03 MED ORDER — FLUPHENAZINE HCL 2.5 MG PO TABS: 2.5000 mg | ORAL_TABLET | Freq: Every day | ORAL | Status: DC

## 2012-11-03 MED ORDER — SALINE SPRAY 0.65 % NA SOLN
1.0000 | NASAL | Status: DC | PRN
  Filled 2012-11-03: qty 44

## 2012-11-03 MED ORDER — ENOXAPARIN SODIUM 40 MG/0.4ML ~~LOC~~ SOLN
40.0000 mg | SUBCUTANEOUS | Status: DC
  Administered 2012-11-03 – 2012-11-06 (×4): 40 mg via SUBCUTANEOUS
  Filled 2012-11-03 (×5): qty 0.4

## 2012-11-03 MED ORDER — LOXAPINE SUCCINATE 10 MG PO CAPS: 10.0000 mg | ORAL_CAPSULE | Freq: Every day | ORAL | Status: DC

## 2012-11-03 MED ORDER — DIGOXIN 250 MCG PO TABS
0.2500 mg | ORAL_TABLET | Freq: Every day | ORAL | Status: AC
  Administered 2012-11-03: 0.25 mg via ORAL
  Filled 2012-11-03 (×2): qty 1

## 2012-11-03 NOTE — Progress Notes (Signed)
[facility] Progress Note [place] [place] TEAM 1 - Stepdown/ICU TEAM   [name]:[id number] DOB: 08-03-1950 DOA: 10/29/2012 PCP: [name], MD  Brief narrative: 62 y/o female with a PMHx of COPD, CVA, HLD, DM2, schizophrenia and R Breast CA (triple negative) who is being treated with radiation and is s/p chemotherapy (Adriamycin).  She presented to [facility] ED 7/16 with SOB and a dry cough. She did not complain of any fevers, chills, n/v or edema. When she presented to the ED she had tachycardia, tachypnea and hypotension. She had a CT of the chest that ruled out PE, but revealed pericardial and pleural effusions. A TTE at [facility] showed EF 20% with global hypokinesis, moderate RV dysfunction, and a small pericardial effusion.   Assessment/Plan:  Acute systolic CHF - EF 20% - suspected adriamycin induced Care as per CHF Team  CAD 60-70% distal left main, 80% long mid RCA - care as per CHF Team  Breast CA status post 4 cycles of Adriamycin and Cytoxan followed by weekly Taxol x 12 doses - now on Arimidex 1 mg daily for 5 years  Anemia of chronic illness Hgb stable/improving - cont to follow   Hx of COPD and continued tobacco use Well compensated at present   DM2 CBG variable at this time - no longer a candidate for glucophage given severe CHF - no change in tx today - cont to follow trend   Hypokalemia Due to diuretic - cont to replace and follow  Hypomagnesemia Recheck in AM  Schizophrenia Have now resumed all of her usual home meds - appears well compensated at this time  UTI UA was abnorm, but culture did not reveal the offending pahtogen - will complete 7 days of empiric abx tx  DVT prophylaxis In this pt with known hx of CA SCDs are not likey adequate for DVT prophy - will initiate lovenox and follow Hgb closley  Code Status: FULL Family Communication: spoke w/ 3 sisters at length at bedside Disposition Plan: SDU  Consultants: Cardiolgoy PCCM >>  TRH  Procedures: 7/18 - cardiac cath - 60-70% distal left main, 80% long mid RCA 7/17 - PICC per IR RUE  Antibiotics: Rocephin 7/18 >>  HPI/Subjective: Pt is resting comfortably in a bedside chair.  She denies any complaints whatsoever.  Objective: Blood pressure 100/66, pulse 117, temperature 98.7 F (37.1 C), temperature source Oral, resp. rate 16, height 5\' 5"  (1.651 m), weight 63.8 kg (140 lb 10.5 oz), SpO2 96.00%.  Intake/Output Summary (Last 24 hours) at 11/03/12 1556 Last data filed at 11/03/12 1500  Gross per 24 hour  Intake 1077.6 ml  Output   2375 ml  Net -1297.4 ml     Exam: General: No acute respiratory distress at rest in chair Lungs: mild bibasilar crackles - no wheeze  Cardiovascular: Regular rate and rhythm without murmur gallop or rub normal S1 and S2 Abdomen: Nontender, nondistended, soft, bowel sounds positive, no rebound, no ascites, no appreciable mass Extremities: No significant cyanosis, clubbing bilateral lower extremities  Data Reviewed: Basic Metabolic Panel:  Recent Labs Lab 10/29/12 0904 10/30/12 0430 10/31/12 0345 10/31/12 1817 11/01/12 0500 11/02/12 0430 11/03/12 0400  NA 134* 137 136  --  134* 136 139  K 4.5 3.4* 3.1*  --  4.6 3.5 3.4*  CL 99 99 98  --  97 98 98  CO2 24 27 29   --  24 26 28   GLUCOSE 168* 137* 137*  --  232* 232* 163*  BUN  9 15 13   --  11 12 15   CREATININE 0.86 1.13* 1.05 0.78 0.76 0.73 0.73  CALCIUM 9.2 9.0 8.3*  --  8.5 8.5 9.2  MG  --  1.1*  --   --  1.5  --   --    Liver Function Tests:  Recent Labs Lab 11/01/12 0500 11/02/12 0430  AST 81* 45*  ALT 130* 99*  ALKPHOS 166* 161*  BILITOT 0.3 0.3  PROT 6.5 6.6  ALBUMIN 3.0* 3.1*   CBC:  Recent Labs Lab 10/29/12 0904  10/30/12 0430 10/31/12 0345 10/31/12 1817 11/02/12 0430 11/03/12 0400  WBC 10.8*  < > 9.1 7.9 8.3 7.8 7.5  NEUTROABS 9.3*  --   --   --   --   --   --   HGB 9.5*  < > 9.6* 8.7* 9.8* 9.3* 10.0*  HCT 29.3*  < > 28.5* 25.6* 29.4*  28.7* 30.3*  MCV 98.7  < > 96.0 95.5 96.4 97.0 96.5  PLT 187  < > 210 175 217 252 263  < > = values in this interval not displayed. Cardiac Enzymes:  Recent Labs Lab 10/29/12 0904  TROPONINI <0.30   BNP (last 3 results)  Recent Labs  10/29/12 0904  PROBNP 6239.0*   CBG:  Recent Labs Lab 11/02/12 0742 11/02/12 1242 11/02/12 2147 11/03/12 0842 11/03/12 1259  GLUCAP 202* 152* 150* 140* 175*    Recent Results (from the past 240 hour(s))  MRSA PCR SCREENING     Status: None   Collection Time    10/29/12  5:30 PM      Result Value Range Status   MRSA by PCR NEGATIVE  NEGATIVE Final   Comment:            The GeneXpert MRSA Assay (FDA     approved for NASAL specimens     only), is one component of a     comprehensive MRSA colonization     surveillance program. It is not     intended to diagnose MRSA     infection nor to guide or     monitor treatment for     MRSA infections.  URINE CULTURE     Status: None   Collection Time    10/30/12  8:41 AM      Result Value Range Status   Specimen Description URINE, CATHETERIZED   Final   Special Requests NONE   Final   Culture  Setup Time 10/30/2012 10:05   Final   Colony Count NO GROWTH   Final   Culture NO GROWTH   Final   Report Status 10/31/2012 FINAL   Final  CULTURE, BLOOD (ROUTINE X 2)     Status: None   Collection Time    10/31/12  1:30 AM      Result Value Range Status   Specimen Description BLOOD LEFT ARM   Final   Special Requests BOTTLES DRAWN AEROBIC ONLY 10CC   Final   Culture  Setup Time 10/31/2012 09:43   Final   Culture     Final   Value:        BLOOD CULTURE RECEIVED NO GROWTH TO DATE CULTURE WILL BE HELD FOR 5 DAYS BEFORE ISSUING A FINAL NEGATIVE REPORT   Report Status PENDING   Incomplete  CULTURE, BLOOD (ROUTINE X 2)     Status: None   Collection Time    10/31/12  1:40 AM      Result Value Range Status  Specimen Description BLOOD LEFT HAND   Final   Special Requests BOTTLES DRAWN AEROBIC ONLY  5CC   Final   Culture  Setup Time 10/31/2012 09:43   Final   Culture     Final   Value:        BLOOD CULTURE RECEIVED NO GROWTH TO DATE CULTURE WILL BE HELD FOR 5 DAYS BEFORE ISSUING A FINAL NEGATIVE REPORT   Report Status PENDING   Incomplete     Studies:  Recent x-ray studies have been reviewed in detail by the Attending Physician  Scheduled Meds:  Scheduled Meds: . aspirin  81 mg Oral Daily  . atorvastatin  40 mg Oral q1800  . cefTRIAXone (ROCEPHIN)  IV  1 g Intravenous Q24H  . [START ON 11/04/2012] digoxin  0.125 mg Oral Daily  . fluPHENAZine  2.5 mg Oral QHS  . furosemide  60 mg Intravenous TID  . heparin  5,000 Units Subcutaneous Q8H  . loxapine  10 mg Oral QHS  . nortriptyline  100 mg Oral QHS  . potassium chloride  40 mEq Oral Daily  . sodium chloride  10 mL Intravenous Q12H  . sodium chloride  3 mL Intravenous Q12H  . spironolactone  12.5 mg Oral Daily   Continuous Infusions: . sodium chloride 10 mL/hr at 11/02/12 1238  . DOBUTamine 5 mcg/kg/min (11/03/12 0800)    Time spent on care of this patient:   [facility]  [facility] Office  [phone] Pager - Text Page per Loretha Stapler as per below:  On-Call/Text Page:      [email or web address]      password [email or web address]  If 7PM-7AM, please contact night-coverage [email or web address] Password [facility] 11/03/2012, 3:56 PM   LOS: 5 days

## 2012-11-03 NOTE — Progress Notes (Signed)
Patient ID: [name], female   DOB: April 15, 1951, 62 y.o.   MRN: [id number] Advanced Heart Failure Rounding Note   Subjective:    [name] is a 62 y/o female with a PMHx of COPD, CVA, HLD, DM2, schizophrenia and R. Breast CA (triple negative) who is currently being treated with radiation and s/p chemiotherapy (Adriamycin).   She presented to [facility] ED yesterday am with SOB with a dry cough. She did not complain of any fevers, chills, n/v or edema. When she presented to the ED she had tachycardia, tachypnea and hypotension. She had a CT of the chest that r/o PE, but showed pericardial and pleural effusion.   ECHO: Reviewed echo from [facility]. Showed EF 20% with global hypokinesis, moderate RV dysfunction, small pericardial effusion.   RHC/LHC 7/18 RA mean 18  RV 45/20  PA 41/21, mean 29  PCWP mean 21  LV 95/16  AO 109/67  Oxygen saturations:  PA 40%  AO 95%  Cardiac Output (Fick) 3.5  Cardiac Index (Fick) 2.05  Cardiac Output (Thermo) 2.9  Cardiac Index (Thermo) 1.7  SVR 1799  Coronary angiography:  Left mainstem: 60-70% distal left main stenosis.  Left anterior descending (LAD): Luminal irregularities in the LAD. 80% ostial stenosis small D1.  Left circumflex (LCx): Small vessel, no significant disease.  Right coronary artery (RCA): Long 80% mid RCA stenosis.  Left ventriculography: EF 25-30%, global hypokinesis.   Friday after cath, milrinone was stopped and dobutamine begun given hypotension requiring norephinephrine. Yesterday norepinephrine stopped and dobutamine increased to 5 mcg.  Co-ox slightly better, 50%.   CVP personally checked 9  Objective:   Weight Range:  Vital Signs:   Temp:  [98 F (36.7 C)-99 F (37.2 C)] 98.5 F (36.9 C) (07/21 0349) Pulse Rate:  [113-121] 121 (07/21 0349) Resp:  [11-25] 13 (07/21 0349) BP: (86-129)/(52-93) 129/77 mmHg (07/21 0349) SpO2:  [92 %-95 %] 94 % (07/21 0349) Weight:  [140 lb 10.5 oz (63.8 kg)] 140 lb  10.5 oz (63.8 kg) (07/21 0500) Last BM Date: 10/30/12  Weight change: Filed Weights   11/01/12 0500 11/02/12 0436 11/03/12 0500  Weight: 145 lb 1 oz (65.8 kg) 143 lb 11.8 oz (65.2 kg) 140 lb 10.5 oz (63.8 kg)    Intake/Output:   Intake/Output Summary (Last 24 hours) at 11/03/12 0647 Last data filed at 11/03/12 0600  Gross per 24 hour  Intake 2130.08 ml  Output   3700 ml  Net -1569.92 ml     Physical Exam:  General: Fatigues appearing. No resp difficulty  HEENT: normal  Neck: supple. JVP 8-9 cm Carotids 2+ bilat; no bruits. No lymphadenopathy or thryomegaly appreciated.  Cor: PMI nondisplaced. Tachycardic, regular rate & regular rhythm. No rubs, gallops or murmurs.  Lungs: clear  Abdomen: soft, nontender, mild distention. No hepatosplenomegaly. No bruits or masses. Good bowel sounds.  Extremities: no cyanosis, clubbing, rash, edema  Neuro: alert & orientedx3, cranial nerves grossly intact. moves all 4 extremities w/o difficulty. Affect pleasant  Telemetry: ST 110s  Labs: Basic Metabolic Panel:  Recent Labs Lab 10/29/12 0904 10/30/12 0430 10/31/12 0345 10/31/12 1817 11/01/12 0500 11/02/12 0430 11/03/12 0400  NA 134* 137 136  --  134* 136 139  K 4.5 3.4* 3.1*  --  4.6 3.5 3.4*  CL 99 99 98  --  97 98 98  CO2 24 27 29   --  24 26 28   GLUCOSE 168* 137* 137*  --  232* 232* 163*  BUN 9  15 13  --  11 12 15   CREATININE 0.86 1.13* 1.05 0.78 0.76 0.73 0.73  CALCIUM 9.2 9.0 8.3*  --  8.5 8.5 9.2  MG  --  1.1*  --   --  1.5  --   --     Liver Function Tests:  Recent Labs Lab 11/01/12 0500 11/02/12 0430  AST 81* 45*  ALT 130* 99*  ALKPHOS 166* 161*  BILITOT 0.3 0.3  PROT 6.5 6.6  ALBUMIN 3.0* 3.1*   No results found for this basename: LIPASE, AMYLASE,  in the last 168 hours No results found for this basename: AMMONIA,  in the last 168 hours  CBC:  Recent Labs Lab 10/29/12 0904  10/30/12 0430 10/31/12 0345 10/31/12 1817 11/02/12 0430 11/03/12 0400  WBC  10.8*  < > 9.1 7.9 8.3 7.8 7.5  NEUTROABS 9.3*  --   --   --   --   --   --   HGB 9.5*  < > 9.6* 8.7* 9.8* 9.3* 10.0*  HCT 29.3*  < > 28.5* 25.6* 29.4* 28.7* 30.3*  MCV 98.7  < > 96.0 95.5 96.4 97.0 96.5  PLT 187  < > 210 175 217 252 263  < > = values in this interval not displayed.  Cardiac Enzymes:  Recent Labs Lab 10/29/12 0904  TROPONINI <0.30    BNP: BNP (last 3 results)  Recent Labs  10/29/12 0904  PROBNP 6239.0*     Other results:     Imaging: No results found.   Medications:     Scheduled Medications: . aspirin  81 mg Oral Daily  . atorvastatin  40 mg Oral q1800  . cefTRIAXone (ROCEPHIN)  IV  1 g Intravenous Q24H  . fluPHENAZine  2.5 mg Oral QHS  . furosemide  60 mg Intravenous TID  . heparin  5,000 Units Subcutaneous Q8H  . loxapine  10 mg Oral QHS  . nortriptyline  100 mg Oral QHS  . potassium chloride  40 mEq Oral Daily  . sodium chloride  10 mL Intravenous Q12H  . sodium chloride  3 mL Intravenous Q12H    Infusions: . sodium chloride 10 mL/hr at 11/02/12 1238  . DOBUTamine 5 mcg/kg/min (11/03/12 0605)    PRN Medications: sodium chloride, sodium chloride, sodium chloride, acetaminophen, ondansetron (ZOFRAN) IV, sodium chloride, sodium chloride, sodium chloride   Assessment:   1) Acute systolic HF, EF 20%  - Suspect adriamycin toxicity 2) Breast CA  3) DM2  4) hypokalemia  5) hypomagnesia  5) Mild leukocytosis  6) Schizophrenia 7) CAD: 50-60% LM, 80% RCA on LHC 10/31/12 8) Hypokalemia  Plan/Discussion:    1) Acute systolic CHF: EF 66% by echo with RV failure.  I think her degree of LV dysfunction is significantly out of proportion to degree of CAD, suspect Adriamycin toxicity is the main issue here.  RHC suggested significant RHC with RA pressure elevated out of proportion to PCWP.  CI remained low on milrinone.  Milrinone stopped given hypotension and low output . Dobutamine was begun and required norepinephrine which was stopped  yesterday. Co-ox this morning at 50% - CVP down to 9. 24 hour I/O -1.6 liters. Continue IV Lasix today, possible switch to po tomorrow. Supplement potassium and add spironolactone 12.5 mg daily.  - Continue dobutamine at 5 mcg. BP ok off norepinephrine- No b-blocker/ACEI at this time due to shock/hypotension. Will add digoxin today, will try to start coming down off dobutamine tomorrow potentially.  Not sure  she will tolerate coming off.  - portacath saline locked, using PICC  - Guarded prognosis: she is not going to be a good candidate for advanced therapies with schizophrenia and breast cancer, also RV dysfunction would make VAD difficult.  I would like to avoid IABP also if possible because we do not have a therapy for the IABP to be a bridge towards.  She may require home dobutamine. Will consult case manager for possible home dobutamine.  2) ID: Now afebrile. Was started on ceftriaxone empirically for possibility of PNA.  3) Diabetes: Per [facility]. 4) CAD: Significant CAD on cath Friday.  I do not think this caused her cardiomyopathy but if she recovers, she will need further evaluation of LM by IVUS. Continue ASA and statin.   Length of Stay: 5   CLEGG,AMY  NP-C 11/03/2012, 6:47 AM  Advanced Heart Failure Team Pager [phone] (M-F; 7a - 4p)  Please contact [facility] for night-coverage after hours (4p -7a ) and weekends on [email or web address]

## 2012-11-04 DIAGNOSIS — E785 Hyperlipidemia, unspecified: Secondary | ICD-10-CM

## 2012-11-04 LAB — CARBOXYHEMOGLOBIN
Methemoglobin: 0.7 % (ref 0.0–1.5)
Total hemoglobin: 10.5 g/dL — ABNORMAL LOW (ref 12.0–16.0)

## 2012-11-04 LAB — BASIC METABOLIC PANEL
GFR calc Af Amer: 79 mL/min — ABNORMAL LOW (ref >90)
GFR calc non Af Amer: 69 mL/min — ABNORMAL LOW (ref >90)
Glucose, Bld: 127 mg/dL — ABNORMAL HIGH (ref 70–99)
Potassium: 3.9 mEq/L (ref 3.5–5.1)
Sodium: 137 mEq/L (ref 135–145)

## 2012-11-04 LAB — GLUCOSE, CAPILLARY

## 2012-11-04 LAB — MAGNESIUM: Magnesium: 1.4 mg/dL — ABNORMAL LOW (ref 1.5–2.5)

## 2012-11-04 MED ORDER — DEXTROSE 5 % IV SOLN
1.0000 g | INTRAVENOUS | Status: DC
  Administered 2012-11-04: 1 g via INTRAVENOUS
  Filled 2012-11-04: qty 10

## 2012-11-04 MED ORDER — SODIUM CHLORIDE 0.9 % IJ SOLN
10.0000 mL | Freq: Two times a day (BID) | INTRAMUSCULAR | Status: DC
  Administered 2012-11-04 – 2012-11-06 (×4): 10 mL

## 2012-11-04 MED ORDER — HEPARIN SOD (PORK) LOCK FLUSH 100 UNIT/ML IV SOLN
500.0000 [IU] | INTRAVENOUS | Status: DC
  Filled 2012-11-04: qty 5

## 2012-11-04 MED ORDER — FUROSEMIDE 40 MG PO TABS
40.0000 mg | ORAL_TABLET | Freq: Two times a day (BID) | ORAL | Status: DC
  Administered 2012-11-04 – 2012-11-05 (×3): 40 mg via ORAL
  Filled 2012-11-04 (×7): qty 1

## 2012-11-04 MED ORDER — HEPARIN SOD (PORK) LOCK FLUSH 100 UNIT/ML IV SOLN
500.0000 [IU] | INTRAVENOUS | Status: DC | PRN
  Administered 2012-11-04: 500 [IU]
  Filled 2012-11-04: qty 5

## 2012-11-04 MED ORDER — MAGNESIUM SULFATE 40 MG/ML IJ SOLN
4.0000 g | Freq: Once | INTRAMUSCULAR | Status: AC
  Administered 2012-11-04: 4 g via INTRAVENOUS
  Filled 2012-11-04: qty 100

## 2012-11-04 MED ORDER — SODIUM CHLORIDE 0.9 % IJ SOLN
10.0000 mL | INTRAMUSCULAR | Status: DC | PRN
  Administered 2012-11-04: 10 mL

## 2012-11-04 NOTE — Progress Notes (Signed)
[facility] Progress Note [place] TEAM 1 - Stepdown/ICU TEAM   [name]:[id number] DOB: [birth year] DOA: 10/29/2012 PCP: [name], MD  Brief narrative: 62 y/o female with a PMHx of COPD, CVA, HLD, DM2, schizophrenia and R Breast CA (triple negative) who is being treated with radiation and is s/p chemotherapy (Adriamycin).  She presented to [facility] ED 7/16 with SOB and a dry cough. She did not complain of any fevers, chills, n/v or edema. When she presented to the ED she had tachycardia, tachypnea and hypotension. She had a CT of the chest that ruled out PE, but revealed pericardial and pleural effusions. A TTE at [facility] showed EF 20% with global hypokinesis, moderate RV dysfunction, and a small pericardial effusion.   Assessment/Plan:  Acute systolic CHF - EF 20% - suspected adriamycin induced Care as per CHF Team  CAD 60-70% distal left main, 80% long mid RCA - care as per CHF Team  Breast CA status post 4 cycles of Adriamycin and Cytoxan followed by weekly Taxol x 12 doses - now on Arimidex 1 mg daily for 5 years  Anemia of chronic illness Hgb stable/improving - cont to follow   Hx of COPD and continued tobacco use Well compensated at present   DM2 CBG variable at this time - can resume metformin as needed - cont to follow trend   Hypokalemia Due to diuretic - being replaced daily  Hypomagnesemia Replaced today  Schizophrenia Have now resumed all of her usual home meds - appears well compensated at this time  UTI UA was abnorm, but culture did not reveal the offending pahtogen - will complete 7 days of empiric abx tx-Rocephin DC'd today (5 days)-will resume for 2 more days  DVT prophylaxis In this pt with known hx of CA SCDs are not likey adequate for DVT prophy - started on lovenox - follow Hgb closley  Code Status: FULL Family Communication: None today Disposition Plan: SDU  Consultants: Cardiolgoy PCCM >> TRH  Procedures: 7/18 -  cardiac cath - 60-70% distal left main, 80% long mid RCA 7/17 - PICC per IR RUE  Antibiotics: Rocephin 7/18 >>  HPI/Subjective: Pt has no complaints  Objective: Blood pressure 90/51, pulse 106, temperature 98.1 F (36.7 C), temperature source Oral, resp. rate 25, height 5\' 5"  (1.651 m), weight 65 kg (143 lb 4.8 oz), SpO2 97.00%.  Intake/Output Summary (Last 24 hours) at 11/04/12 1419 Last data filed at 11/04/12 1115  Gross per 24 hour  Intake 666.19 ml  Output   3325 ml  Net -2658.81 ml     Exam: General: No acute respiratory distress at rest in chair Lungs: mild bibasilar crackles - no wheeze  Cardiovascular: Regular rate and rhythm without murmur gallop or rub normal S1 and S2 Abdomen: Nontender, nondistended, soft, bowel sounds positive, no rebound, no ascites, no appreciable mass Extremities: No significant cyanosis, clubbing bilateral lower extremities  Data Reviewed: Basic Metabolic Panel:  Recent Labs Lab 10/29/12 0904 10/30/12 0430 10/31/12 0345 10/31/12 1817 11/01/12 0500 11/02/12 0430 11/03/12 0400 11/04/12 0425  NA 134* 137 136  --  134* 136 139 137  K 4.5 3.4* 3.1*  --  4.6 3.5 3.4* 3.9  CL 99 99 98  --  97 98 98 99  CO2 24 27 29   --  24 26 28 25   GLUCOSE 168* 137* 137*  --  232* 232* 163* 127*  BUN 9 15 13   --  11 12 15 20   CREATININE 0.86  1.13* 1.05 0.78 0.76 0.73 0.73 0.89  CALCIUM 9.2 9.0 8.3*  --  8.5 8.5 9.2 9.4  MG  --  1.1*  --   --  1.5  --   --  1.4*   Liver Function Tests:  Recent Labs Lab 11/01/12 0500 11/02/12 0430  AST 81* 45*  ALT 130* 99*  ALKPHOS 166* 161*  BILITOT 0.3 0.3  PROT 6.5 6.6  ALBUMIN 3.0* 3.1*   CBC:  Recent Labs Lab 10/29/12 0904  10/30/12 0430 10/31/12 0345 10/31/12 1817 11/02/12 0430 11/03/12 0400  WBC 10.8*  < > 9.1 7.9 8.3 7.8 7.5  NEUTROABS 9.3*  --   --   --   --   --   --   HGB 9.5*  < > 9.6* 8.7* 9.8* 9.3* 10.0*  HCT 29.3*  < > 28.5* 25.6* 29.4* 28.7* 30.3*  MCV 98.7  < > 96.0 95.5 96.4  97.0 96.5  PLT 187  < > 210 175 217 252 263  < > = values in this interval not displayed. Cardiac Enzymes:  Recent Labs Lab 10/29/12 0904  TROPONINI <0.30   BNP (last 3 results)  Recent Labs  10/29/12 0904  PROBNP 6239.0*   CBG:  Recent Labs Lab 11/03/12 0842 11/03/12 1259 11/03/12 1648 11/03/12 2204 11/04/12 1205  GLUCAP 140* 175* 102* 121* 213*    Recent Results (from the past 240 hour(s))  MRSA PCR SCREENING     Status: None   Collection Time    10/29/12  5:30 PM      Result Value Range Status   MRSA by PCR NEGATIVE  NEGATIVE Final   Comment:            The GeneXpert MRSA Assay (FDA     approved for NASAL specimens     only), is one component of a     comprehensive MRSA colonization     surveillance program. It is not     intended to diagnose MRSA     infection nor to guide or     monitor treatment for     MRSA infections.  URINE CULTURE     Status: None   Collection Time    10/30/12  8:41 AM      Result Value Range Status   Specimen Description URINE, CATHETERIZED   Final   Special Requests NONE   Final   Culture  Setup Time 10/30/2012 10:05   Final   Colony Count NO GROWTH   Final   Culture NO GROWTH   Final   Report Status 10/31/2012 FINAL   Final  CULTURE, BLOOD (ROUTINE X 2)     Status: None   Collection Time    10/31/12  1:30 AM      Result Value Range Status   Specimen Description BLOOD LEFT ARM   Final   Special Requests BOTTLES DRAWN AEROBIC ONLY 10CC   Final   Culture  Setup Time 10/31/2012 09:43   Final   Culture     Final   Value:        BLOOD CULTURE RECEIVED NO GROWTH TO DATE CULTURE WILL BE HELD FOR 5 DAYS BEFORE ISSUING A FINAL NEGATIVE REPORT   Report Status PENDING   Incomplete  CULTURE, BLOOD (ROUTINE X 2)     Status: None   Collection Time    10/31/12  1:40 AM      Result Value Range Status   Specimen Description BLOOD LEFT HAND  Final   Special Requests BOTTLES DRAWN AEROBIC ONLY 5CC   Final   Culture  Setup Time  10/31/2012 09:43   Final   Culture     Final   Value:        BLOOD CULTURE RECEIVED NO GROWTH TO DATE CULTURE WILL BE HELD FOR 5 DAYS BEFORE ISSUING A FINAL NEGATIVE REPORT   Report Status PENDING   Incomplete     Studies:  Recent x-ray studies have been reviewed in detail by the Attending Physician  Scheduled Meds:  Scheduled Meds: . aspirin  81 mg Oral Daily  . atorvastatin  40 mg Oral q1800  . digoxin  0.125 mg Oral Daily  . docusate sodium  200 mg Oral QHS  . enoxaparin (LOVENOX) injection  40 mg Subcutaneous Q24H  . fluPHENAZine  2.5 mg Oral QHS  . furosemide  40 mg Oral BID  . heparin lock flush  500 Units Intracatheter Q30 days  . loxapine  10 mg Oral QHS  . nortriptyline  100 mg Oral QHS  . potassium chloride  40 mEq Oral BID  . ramelteon  8 mg Oral QHS  . sodium chloride  10-40 mL Intracatheter Q12H  . spironolactone  12.5 mg Oral Daily  . traZODone  150 mg Oral QHS   Continuous Infusions: . sodium chloride 10 mL/hr at 11/02/12 1238  . DOBUTamine 4 mcg/kg/min (11/04/12 1610)    Time spent on care of this patient:   [facility]  [facility] Office  [phone] Pager - Text Page per Loretha Stapler as per below:  On-Call/Text Page:      [email or web address]      password [email or web address]  If 7PM-7AM, please contact night-coverage [email or web address] Password [facility] 11/04/2012, 2:19 PM   LOS: 6 days

## 2012-11-04 NOTE — Progress Notes (Signed)
Patient ID: [name], female   DOB: [birth year], 62 y.o.   MRN: [id number] Advanced Heart Failure Rounding Note   Subjective:    [name] is a 62 y/o female with a PMHx of COPD, CVA, HLD, DM2, schizophrenia and R. Breast CA (triple negative) who is currently being treated with radiation and s/p chemiotherapy (Adriamycin).   She presented to [facility] ED yesterday am with SOB with a dry cough. She did not complain of any fevers, chills, n/v or edema. When she presented to the ED she had tachycardia, tachypnea and hypotension. She had a CT of the chest that r/o PE, but showed pericardial and pleural effusion.   ECHO: Reviewed echo from [facility]. Showed EF 20% with global hypokinesis, moderate RV dysfunction, small pericardial effusion.   RHC/LHC 7/18 RA mean 18  RV 45/20  PA 41/21, mean 29  PCWP mean 21  LV 95/16  AO 109/67  Oxygen saturations:  PA 40%  AO 95%  Cardiac Output (Fick) 3.5  Cardiac Index (Fick) 2.05  Cardiac Output (Thermo) 2.9  Cardiac Index (Thermo) 1.7  SVR 1799  Coronary angiography:  Left mainstem: 60-70% distal left main stenosis.  Left anterior descending (LAD): Luminal irregularities in the LAD. 80% ostial stenosis small D1.  Left circumflex (LCx): Small vessel, no significant disease.  Right coronary artery (RCA): Long 80% mid RCA stenosis.  Left ventriculography: EF 25-30%, global hypokinesis.   Friday after cath, milrinone was stopped and dobutamine begun given hypotension requiring norephinephrine. On Sunday, Norepinephrine stopped and dobutamine increased to 5 mcg.  Yesterday spironolactone added.  Weight up 3 pounds (bed weight) but 24 hour I/O -2.5 liters.   Co-ox pending.    Magnesium 1.4  CVP personally checked 3  Objective:   Weight Range:  Vital Signs:   Temp:  [98.2 F (36.8 C)-98.7 F (37.1 C)] 98.3 F (36.8 C) (07/22 0505) Pulse Rate:  [66-117] 110 (07/22 0505) Resp:  [11-19] 19 (07/22 0600) BP: (90-124)/(49-101)  93/49 mmHg (07/22 0505) SpO2:  [90 %-96 %] 90 % (07/22 0505) Weight:  [143 lb 4.8 oz (65 kg)] 143 lb 4.8 oz (65 kg) (07/22 0500) Last BM Date: 10/30/12  Weight change: Filed Weights   11/02/12 0436 11/03/12 0500 11/04/12 0500  Weight: 143 lb 11.8 oz (65.2 kg) 140 lb 10.5 oz (63.8 kg) 143 lb 4.8 oz (65 kg)    Intake/Output:   Intake/Output Summary (Last 24 hours) at 11/04/12 0651 Last data filed at 11/04/12 0600  Gross per 24 hour  Intake  757.6 ml  Output   3300 ml  Net -2542.4 ml     Physical Exam:  General: Fatigues appearing. No resp difficulty  HEENT: normal  Neck: supple. JVP 5-6  cm Carotids 2+ bilat; no bruits. No lymphadenopathy or thryomegaly appreciated.  Cor: PMI nondisplaced. Tachycardic, regular rate & regular rhythm. No rubs, gallops or murmurs.  Lungs: clear  Abdomen: soft, nontender, mild distention. No hepatosplenomegaly. No bruits or masses. Good bowel sounds.  Extremities: no cyanosis, clubbing, rash, edema  Neuro: alert & orientedx3, cranial nerves grossly intact. moves all 4 extremities w/o difficulty. Affect pleasant  Telemetry: ST 100s  Labs: Basic Metabolic Panel:  Recent Labs Lab 10/29/12 0904 10/30/12 0430 10/31/12 0345 10/31/12 1817 11/01/12 0500 11/02/12 0430 11/03/12 0400 11/04/12 0425  NA 134* 137 136  --  134* 136 139 137  K 4.5 3.4* 3.1*  --  4.6 3.5 3.4* 3.9  CL 99 99 98  --  97  98 98 99  CO2 24 27 29   --  24 26 28 25   GLUCOSE 168* 137* 137*  --  232* 232* 163* 127*  BUN 9 15 13   --  11 12 15 20   CREATININE 0.86 1.13* 1.05 0.78 0.76 0.73 0.73 0.89  CALCIUM 9.2 9.0 8.3*  --  8.5 8.5 9.2 9.4  MG  --  1.1*  --   --  1.5  --   --  1.4*    Liver Function Tests:  Recent Labs Lab 11/01/12 0500 11/02/12 0430  AST 81* 45*  ALT 130* 99*  ALKPHOS 166* 161*  BILITOT 0.3 0.3  PROT 6.5 6.6  ALBUMIN 3.0* 3.1*   No results found for this basename: LIPASE, AMYLASE,  in the last 168 hours No results found for this basename:  AMMONIA,  in the last 168 hours  CBC:  Recent Labs Lab 10/29/12 0904  10/30/12 0430 10/31/12 0345 10/31/12 1817 11/02/12 0430 11/03/12 0400  WBC 10.8*  < > 9.1 7.9 8.3 7.8 7.5  NEUTROABS 9.3*  --   --   --   --   --   --   HGB 9.5*  < > 9.6* 8.7* 9.8* 9.3* 10.0*  HCT 29.3*  < > 28.5* 25.6* 29.4* 28.7* 30.3*  MCV 98.7  < > 96.0 95.5 96.4 97.0 96.5  PLT 187  < > 210 175 217 252 263  < > = values in this interval not displayed.  Cardiac Enzymes:  Recent Labs Lab 10/29/12 0904  TROPONINI <0.30    BNP: BNP (last 3 results)  Recent Labs  10/29/12 0904  PROBNP 6239.0*     Other results:     Imaging: No results found.   Medications:     Scheduled Medications: . aspirin  81 mg Oral Daily  . atorvastatin  40 mg Oral q1800  . cefTRIAXone (ROCEPHIN)  IV  1 g Intravenous Q24H  . digoxin  0.125 mg Oral Daily  . docusate sodium  200 mg Oral QHS  . enoxaparin (LOVENOX) injection  40 mg Subcutaneous Q24H  . fluPHENAZine  2.5 mg Oral QHS  . furosemide  60 mg Intravenous TID  . loxapine  10 mg Oral QHS  . nortriptyline  100 mg Oral QHS  . potassium chloride  40 mEq Oral BID  . ramelteon  8 mg Oral QHS  . spironolactone  12.5 mg Oral Daily  . traZODone  150 mg Oral QHS    Infusions: . sodium chloride 10 mL/hr at 11/02/12 1238  . DOBUTamine 5 mcg/kg/min (11/03/12 2000)    PRN Medications: acetaminophen, magic mouthwash, ondansetron (ZOFRAN) IV, polyvinyl alcohol, sodium chloride   Assessment:   1) Acute systolic HF, EF 20%  - Suspect adriamycin toxicity 2) Breast CA  3) DM2  4) hypokalemia  5) hypomagnesia  5) Mild leukocytosis  6) Schizophrenia 7) CAD: 50-60% LM, 80% RCA on LHC 10/31/12 8) Hypokalemia-resolved 9) Hypomagnesium  Plan/Discussion:    1) Acute systolic CHF: EF 16% by echo with RV failure.  I think her degree of LV dysfunction is significantly out of proportion to degree of CAD, suspect Adriamycin toxicity is the main issue here.  RHC  suggested significant RHC with RA pressure elevated out of proportion to PCWP.  CI remained low on milrinone.  Milrinone stopped given hypotension and low output . Dobutamine was begun and required norepinephrine which was stopped on Sunday. Co-ox pending.  - CVP down to 3. 24 hour I/O -2.5  liters. Stop IV lasix transition to lasix 40 mg po bid.  - Continue dobutamine at 5 mcg. BP ok off norepinephrine- No b-blocker/ACEI at this time due to shock/hypotension. Added digoxin yesterday, will try to start coming down off dobutamine today.  Not sure she will tolerate coming off.  - portacath saline locked, using PICC  - Guarded prognosis: she is not going to be a good candidate for advanced therapies with schizophrenia and breast cancer, also RV dysfunction would make VAD difficult.  I would like to avoid IABP also if possible because we do not have a therapy for the IABP to be a bridge towards.  She may require home dobutamine. Will consult case manager for possible home dobutamine.  2) ID: Now afebrile. Was started on ceftriaxone empirically for possibility of PNA.  3) Diabetes: Per [facility]. 4) CAD: Significant CAD on cath Friday.  I do not think this caused her cardiomyopathy but if she recovers, she will need further evaluation of LM by IVUS. Continue ASA and statin.  5) Hypomagnesium- Give 4 grams magnesium this am and recheck Magnesium today.  Length of Stay: 6   CLEGG,AMY  NP-C 11/04/2012, 6:51 AM  Advanced Heart Failure Team Pager [phone] (M-F; 7a - 4p)  Please contact [facility] for night-coverage after hours (4p -7a ) and weekends on [email or web address]  Patient seen with NP, agree with the above note.  Patient looks better, HR is down a bit, CVP now 3.   - As above, change to po diuretics.  - Continue digoxin, will try to decrease dobutamine (as above, not sure that she will tolerate this well).   Marca Ancona 11/04/2012 8:21 AM

## 2012-11-04 NOTE — Clinical Social Work Note (Signed)
Clinical Social Worker paged Chaplain services to assist with advance directive request. CSW will sign off at this time.   Rozetta Nunnery MSW, [employer] [phone]

## 2012-11-04 NOTE — Progress Notes (Signed)
Chaplain visited pt in response to a request for advance directives. Brother was present during this visitation. I told the pt I was visiting to help her complete her request for advance directives. Pt said "I did not requested for one" Pt's brother will get back to me tomorrow after they have a family meeting tonight. Pt was going through chemo and appeared to be in a state of confusion. Chaplain will follow up tomorrow to help pt and family complete advance directives depending on the outcome of their family meeting tonight. Pt's brother thanked Orthoptist for coming. Kelle Darting [phone]  11/04/12 1708  Clinical Encounter Type  Visited With Patient and family together  Visit Type Initial;Critical Care;Other (Comment)  Referral From Nurse  Consult/Referral To Chaplain  Spiritual Encounters  Spiritual Needs Other (Comment) (Advance directives)  Stress Factors  Patient Stress Factors Health changes;Major life changes  Family Stress Factors Major life changes

## 2012-11-04 NOTE — Progress Notes (Signed)
Dressing to right PICC line changed. Pt states that it is tender no redness noted slight swollen. Red port does not aspirate./ MD notified not further orders will continue to monintor

## 2012-11-05 LAB — CARBOXYHEMOGLOBIN
Carboxyhemoglobin: 1.7 % — ABNORMAL HIGH (ref 0.5–1.5)
Carboxyhemoglobin: 1.2 % (ref 0.5–1.5)
Carboxyhemoglobin: 1.9 % — ABNORMAL HIGH (ref 0.5–1.5)
O2 Saturation: 73.6 %
Total hemoglobin: 10.6 g/dL — ABNORMAL LOW (ref 12.0–16.0)

## 2012-11-05 LAB — BASIC METABOLIC PANEL
CO2: 28 mEq/L (ref 19–32)
Chloride: 101 mEq/L (ref 96–112)
GFR calc non Af Amer: 64 mL/min — ABNORMAL LOW (ref >90)
Glucose, Bld: 146 mg/dL — ABNORMAL HIGH (ref 70–99)
Potassium: 4.6 mEq/L (ref 3.5–5.1)
Sodium: 138 mEq/L (ref 135–145)

## 2012-11-05 MED ORDER — MAGNESIUM HYDROXIDE 400 MG/5ML PO SUSP
15.0000 mL | Freq: Every day | ORAL | Status: DC | PRN
  Administered 2012-11-05: 15 mL via ORAL
  Filled 2012-11-05: qty 30

## 2012-11-05 MED ORDER — POTASSIUM CHLORIDE CRYS ER 20 MEQ PO TBCR
20.0000 meq | EXTENDED_RELEASE_TABLET | Freq: Two times a day (BID) | ORAL | Status: DC
  Administered 2012-11-05 – 2012-11-07 (×5): 20 meq via ORAL
  Filled 2012-11-05 (×4): qty 1

## 2012-11-05 MED ORDER — DOBUTAMINE IN D5W 4-5 MG/ML-% IV SOLN
3.0000 ug/kg/min | INTRAVENOUS | Status: DC
  Administered 2012-11-05 – 2012-11-07 (×2): 3 ug/kg/min via INTRAVENOUS
  Filled 2012-11-05: qty 250

## 2012-11-05 MED ORDER — CEFTRIAXONE SODIUM 1 G IJ SOLR
1.0000 g | INTRAMUSCULAR | Status: AC
  Administered 2012-11-05 – 2012-11-06 (×2): 1 g via INTRAVENOUS
  Filled 2012-11-05 (×2): qty 10

## 2012-11-05 MED ORDER — LISINOPRIL 2.5 MG PO TABS
1.2500 mg | ORAL_TABLET | Freq: Every day | ORAL | Status: DC
  Administered 2012-11-05: 13:00:00 via ORAL
  Administered 2012-11-06 – 2012-11-07 (×2): 1.25 mg via ORAL
  Filled 2012-11-05 (×3): qty 0.5

## 2012-11-05 MED ORDER — CEFTRIAXONE SODIUM 1 G IJ SOLR
1.0000 g | INTRAMUSCULAR | Status: DC
  Filled 2012-11-05: qty 10

## 2012-11-05 NOTE — Progress Notes (Signed)
Patient evaluated for long-term disease management services with [facility] Care Management Program as a benefit of her [employer] insurance. Spoke with patient and sister, Michiel Cowboy, to explain [facility] Care Management services. Patient initially declined but her sister suggested to patient that she could benefit. After that, Ms Eubanks signed consents. Explained to patient and sister that [facility] Care Management will not replace home health services. Patient will receive a post discharge transition of care call and evaluated for monthly home visits for assessments and for education. Ms Mcguirt lives with her sister. Left [facility] Care Management packet and contact information with sister. Updated inpatient RNCM regarding conversation with patient.  Raiford Noble, MSN-Ed, RN,BSN, [facility], [phone]

## 2012-11-05 NOTE — Progress Notes (Signed)
Co ox drawn at 1300 represents pt being on dobutamine since 0800 today. At 1536 pt had been off dobutamine since 1400. [name]

## 2012-11-05 NOTE — Progress Notes (Signed)
[facility] Progress Note [place] TEAM 1 - Stepdown/ICU TEAM   [name] WUJ:[id number] DOB: [birth year] DOA: 10/29/2012 PCP: [name], MD  Brief narrative: 62 y/o female with a PMHx of COPD, CVA, HLD, DM2, schizophrenia and R Breast CA (triple negative) who is being treated with radiation and is s/p chemotherapy (Adriamycin).  She presented to [facility] ED 7/16 with SOB and a dry cough. She did not complain of any fevers, chills, n/v or edema. When she presented to the ED she had tachycardia, tachypnea and hypotension. She had a CT of the chest that ruled out PE, but revealed pericardial and pleural effusions. A TTE at [facility] showed EF 20% with global hypokinesis, moderate RV dysfunction, and a small pericardial effusion.   Assessment/Plan:  Acute systolic CHF - EF 20% - suspected adriamycin induced Care as per CHF Team - 13L to date   CAD 60-70% distal left main, 80% long mid RCA - care as per Cardiology/CHF Team  Breast CA status post 4 cycles of Adriamycin and Cytoxan followed by weekly Taxol x 12 doses - now on Arimidex 1 mg daily for 5 years  Anemia of chronic illness Hgb stable/improving - cont to follow   Hx of COPD and continued tobacco use Well compensated at present   DM2 no longer a candidate for glucophage given severe CHF - no change in tx today - cont to follow trend   Hypokalemia Due to diuretic - replaced to normal level - follow   Hypomagnesemia Replaced to normal level   Schizophrenia Have now resumed all of her usual home meds - appears well compensated at this time  UTI UA was abnorm, but culture did not reveal the offending pathogen - will complete 7 days of empiric abx tx  DVT prophylaxis Continue lovenox  Code Status: FULL Family Communication: spoke w/ sister at bedside  Disposition Plan: SDU  Consultants: Cardiolgoy PCCM >> TRH  Procedures: 7/18 - cardiac cath - 60-70% distal left main, 80% long mid RCA 7/17 - PICC  per IR RUE  Antibiotics: Rocephin 7/18 >>7/24  HPI/Subjective: Pt wants to go home.  Denies cp, sob, n/v, or abdom pain.   Objective: Blood pressure 88/56, pulse 106, temperature 98.4 F (36.9 C), temperature source Oral, resp. rate 18, height 5\' 5"  (1.651 m), weight 61.9 kg (136 lb 7.4 oz), SpO2 94.00%.  Intake/Output Summary (Last 24 hours) at 11/05/12 1430 Last data filed at 11/05/12 1300  Gross per 24 hour  Intake 1668.53 ml  Output   2150 ml  Net -481.47 ml    Exam: General: No acute respiratory distress at rest Lungs: CTA th/o w/o wheeze  Cardiovascular: Regular rate and rhythm without murmur gallop or rub  Abdomen: Nontender, nondistended, soft, bowel sounds positive, no rebound, no ascites, no appreciable mass Extremities: No significant cyanosis, clubbing bilateral lower extremities  Data Reviewed: Basic Metabolic Panel:  Recent Labs Lab 10/30/12 0430  11/01/12 0500 11/02/12 0430 11/03/12 0400 11/04/12 0425 11/04/12 1611 11/05/12 0400  NA 137  < > 134* 136 139 137  --  138  K 3.4*  < > 4.6 3.5 3.4* 3.9  --  4.6  CL 99  < > 97 98 98 99  --  101  CO2 27  < > 24 26 28 25   --  28  GLUCOSE 137*  < > 232* 232* 163* 127*  --  146*  BUN 15  < > 11 12 15 20   --  22  CREATININE 1.13*  < > 0.76 0.73 0.73 0.89  --  0.94  CALCIUM 9.0  < > 8.5 8.5 9.2 9.4  --  9.9  MG 1.1*  --  1.5  --   --  1.4* 2.4  --   < > = values in this interval not displayed. Liver Function Tests:  Recent Labs Lab 11/01/12 0500 11/02/12 0430  AST 81* 45*  ALT 130* 99*  ALKPHOS 166* 161*  BILITOT 0.3 0.3  PROT 6.5 6.6  ALBUMIN 3.0* 3.1*   CBC:  Recent Labs Lab 10/30/12 0430 10/31/12 0345 10/31/12 1817 11/02/12 0430 11/03/12 0400  WBC 9.1 7.9 8.3 7.8 7.5  HGB 9.6* 8.7* 9.8* 9.3* 10.0*  HCT 28.5* 25.6* 29.4* 28.7* 30.3*  MCV 96.0 95.5 96.4 97.0 96.5  PLT 210 175 217 252 263   BNP (last 3 results)  Recent Labs  10/29/12 0904  PROBNP 6239.0*   CBG:  Recent Labs Lab  11/03/12 1648 11/03/12 2204 11/04/12 1205 11/04/12 1649 11/04/12 2150  GLUCAP 102* 121* 213* 122* 176*    Recent Results (from the past 240 hour(s))  MRSA PCR SCREENING     Status: None   Collection Time    10/29/12  5:30 PM      Result Value Range Status   MRSA by PCR NEGATIVE  NEGATIVE Final   Comment:            The GeneXpert MRSA Assay (FDA     approved for NASAL specimens     only), is one component of a     comprehensive MRSA colonization     surveillance program. It is not     intended to diagnose MRSA     infection nor to guide or     monitor treatment for     MRSA infections.  URINE CULTURE     Status: None   Collection Time    10/30/12  8:41 AM      Result Value Range Status   Specimen Description URINE, CATHETERIZED   Final   Special Requests NONE   Final   Culture  Setup Time 10/30/2012 10:05   Final   Colony Count NO GROWTH   Final   Culture NO GROWTH   Final   Report Status 10/31/2012 FINAL   Final  CULTURE, BLOOD (ROUTINE X 2)     Status: None   Collection Time    10/31/12  1:30 AM      Result Value Range Status   Specimen Description BLOOD LEFT ARM   Final   Special Requests BOTTLES DRAWN AEROBIC ONLY 10CC   Final   Culture  Setup Time 10/31/2012 09:43   Final   Culture     Final   Value:        BLOOD CULTURE RECEIVED NO GROWTH TO DATE CULTURE WILL BE HELD FOR 5 DAYS BEFORE ISSUING A FINAL NEGATIVE REPORT   Report Status PENDING   Incomplete  CULTURE, BLOOD (ROUTINE X 2)     Status: None   Collection Time    10/31/12  1:40 AM      Result Value Range Status   Specimen Description BLOOD LEFT HAND   Final   Special Requests BOTTLES DRAWN AEROBIC ONLY 5CC   Final   Culture  Setup Time 10/31/2012 09:43   Final   Culture     Final   Value:        BLOOD CULTURE RECEIVED NO GROWTH TO DATE CULTURE WILL  BE HELD FOR 5 DAYS BEFORE ISSUING A FINAL NEGATIVE REPORT   Report Status PENDING   Incomplete     Studies:  Recent x-ray studies have been reviewed in  detail by the Attending Physician  Scheduled Meds:  Scheduled Meds: . aspirin  81 mg Oral Daily  . atorvastatin  40 mg Oral q1800  . cefTRIAXone (ROCEPHIN) IVPB 1 gram/50 mL D5W  1 g Intravenous Q24H  . digoxin  0.125 mg Oral Daily  . docusate sodium  200 mg Oral QHS  . enoxaparin (LOVENOX) injection  40 mg Subcutaneous Q24H  . fluPHENAZine  2.5 mg Oral QHS  . furosemide  40 mg Oral BID  . heparin lock flush  500 Units Intracatheter Q30 days  . lisinopril  1.25 mg Oral Daily  . loxapine  10 mg Oral QHS  . nortriptyline  100 mg Oral QHS  . potassium chloride  20 mEq Oral BID  . ramelteon  8 mg Oral QHS  . sodium chloride  10-40 mL Intracatheter Q12H  . spironolactone  12.5 mg Oral Daily  . traZODone  150 mg Oral QHS    Time spent on care of this patient:   Xochil Shanker T  [facility] Office  [phone] Pager - Text Page per Loretha Stapler as per below:  On-Call/Text Page:      [email or web address]      password [email or web address]  If 7PM-7AM, please contact night-coverage [email or web address] Password [email or web address] 11/05/2012, 2:30 PM   LOS: 7 days

## 2012-11-05 NOTE — Evaluation (Signed)
Physical Therapy Evaluation Patient Details Name: [name] MRN: [id number] DOB: [birth year] Today's Date: 11/05/2012 Time: 4098-1191 PT Time Calculation (min): 28 min  PT Assessment / Plan / Recommendation History of Present Illness  62 y/o female with a PMHx of COPD, CVA, HLD, DM2, schizophrenia and R. Breast CA (triple negative) who is currently being treated with radiation and s/p chemiotherapy (Adriamycin).   Clinical Impression  Pt functioning near baseline however desires to use RW despite not using one PTA. Pt SpO2 dec on RA during amb and c/o of SOB. Spoke with sister, she con't to desire pt to return home with her. Pt safe to return home with 24/7 supervision of sister, use of RW and possibly use of O2 when medically stable.    PT Assessment  Patient needs continued PT services    Follow Up Recommendations  Home health PT;Supervision/Assistance - 24 hour    Does the patient have the potential to tolerate intense rehabilitation      Barriers to Discharge        Equipment Recommendations  Rolling walker with 5" wheels    Recommendations for Other Services     Frequency Min 3X/week    Precautions / Restrictions Precautions Precautions: Fall Restrictions Weight Bearing Restrictions: No   Pertinent Vitals/Pain SATURATION QUALIFICATIONS: (This note is used to comply with regulatory documentation for home oxygen)  Patient Saturations on Room Air at Rest = 98%  Patient Saturations on Room Air while Ambulating = 78-84%  Patient Saturations on 2 Liters of oxygen while Ambulating = >90%  Please briefly explain why patient needs home oxygen:      Mobility  Bed Mobility Bed Mobility: Not assessed Transfers Transfers: Sit to Stand;Stand to Sit Sit to Stand: 5: Supervision;With upper extremity assist;With armrests Stand to Sit: 5: Supervision;With upper extremity assist;To chair/3-in-1 Details for Transfer Assistance: v/c's for safe hand  placement Ambulation/Gait Ambulation/Gait Assistance: 4: Min guard Ambulation Distance (Feet): 100 Feet (x2) Assistive device: Rolling walker Ambulation/Gait Assistance Details: pt preferred to use RW despite not using one at home. amb on RA SpO2 78-84%. when amb on 2 LO2 SpO2 >90%. no episodes of LOB Gait Pattern: Step-through pattern;Decreased stride length Gait velocity: slow General Gait Details: pt with report of SOB, RR at 17 Stairs: No    Exercises     PT Diagnosis: Generalized weakness  PT Problem List: Decreased strength;Decreased activity tolerance;Decreased balance;Decreased mobility PT Treatment Interventions: Gait training;Stair training;DME instruction;Therapeutic activities;Functional mobility training;Therapeutic exercise     PT Goals(Current goals can be found in the care plan section) Acute Rehab PT Goals Patient Stated Goal: home PT Goal Formulation: With patient/family Time For Goal Achievement: 11/12/12 Potential to Achieve Goals: Good  Visit Information  Last PT Received On: 11/04/12 Assistance Needed: +1 History of Present Illness: 62 y/o female with a PMHx of COPD, CVA, HLD, DM2, schizophrenia and R. Breast CA (triple negative) who is currently being treated with radiation and s/p chemiotherapy (Adriamycin).        Prior Functioning  Home Living Family/patient expects to be discharged to:: Private residence Living Arrangements: Other relatives (2 sisters) Available Help at Discharge: Family;Available 24 hours/day Type of Home: House Home Access: Stairs to enter [employer] of Steps: 3 Entrance Stairs-Rails: Can reach both Home Layout: One level Home Equipment: Shower seat Additional Comments: tub shower Prior Function Level of Independence: Independent (sister does cooking, pt doesn't drive) Communication Communication: No difficulties Dominant Hand: Right    Cognition  Cognition Arousal/Alertness: Awake/alert Behavior  During  Therapy: WFL for tasks assessed/performed Overall Cognitive Status: History of cognitive impairments - at baseline    Extremity/Trunk Assessment Upper Extremity Assessment Upper Extremity Assessment: Generalized weakness Lower Extremity Assessment Lower Extremity Assessment: Generalized weakness Cervical / Trunk Assessment Cervical / Trunk Assessment: Normal   Balance    End of Session PT - End of Session Equipment Utilized During Treatment: Gait belt;Oxygen Activity Tolerance: Patient tolerated treatment well Patient left: in chair;with call bell/phone within reach Nurse Communication: Mobility status (SpO2 during amb on RA and 2LO2)  GP     [name] 11/05/2012, 11:33 AM  Lewis Shock, PT, DPT Pager #: [phone] Office #: [phone]

## 2012-11-05 NOTE — Progress Notes (Addendum)
Patient ID: [name], female   DOB: 1950/07/03, 62 y.o.   MRN: [id number] Advanced Heart Failure Rounding Note   Subjective:    [name] is a 62 y/o female with a PMHx of COPD, CVA, HLD, DM2, schizophrenia and R. Breast CA (triple negative) who is currently being treated with radiation and s/p chemiotherapy (Adriamycin).   She presented to [facility] ED yesterday am with SOB with a dry cough. She did not complain of any fevers, chills, n/v or edema. When she presented to the ED she had tachycardia, tachypnea and hypotension. She had a CT of the chest that r/o PE, but showed pericardial and pleural effusion.   ECHO: Reviewed echo from [facility]. Showed EF 20% with global hypokinesis, moderate RV dysfunction, small pericardial effusion.   RHC/LHC 7/18 RA mean 18  RV 45/20  PA 41/21, mean 29  PCWP mean 21  LV 95/16  AO 109/67  Oxygen saturations:  PA 40%  AO 95%  Cardiac Output (Fick) 3.5  Cardiac Index (Fick) 2.05  Cardiac Output (Thermo) 2.9  Cardiac Index (Thermo) 1.7  SVR 1799  Coronary angiography:  Left mainstem: 60-70% distal left main stenosis.  Left anterior descending (LAD): Luminal irregularities in the LAD. 80% ostial stenosis small D1.  Left circumflex (LCx): Small vessel, no significant disease.  Right coronary artery (RCA): Long 80% mid RCA stenosis.  Left ventriculography: EF 25-30%, global hypokinesis.   Friday after cath, milrinone was stopped and dobutamine begun given hypotension requiring norephinephrine. On Sunday, Norepinephrine stopped and dobutamine increased to 5 mcg.  Spironolactone and digoxin added.  Began titrating down on dobutamine yesterday.   Co-ox yesterday looked better than in the past, 61%.  CVP remains low ranging 2-6.  Patient is now on po Lasix. She feels good this morning.   Objective:   Weight Range:  Vital Signs:   Temp:  [97.4 F (36.3 C)-98.7 F (37.1 C)] 98.4 F (36.9 C) (07/23 0355) Pulse Rate:  [106-108] 108  (07/23 0600) Resp:  [14-25] 16 (07/23 0600) BP: (82-121)/(51-68) 99/57 mmHg (07/23 0400) SpO2:  [85 %-98 %] 97 % (07/23 0600) Weight:  [61.9 kg (136 lb 7.4 oz)] 61.9 kg (136 lb 7.4 oz) (07/23 0500) Last BM Date: 10/30/12  Weight change: Filed Weights   11/03/12 0500 11/04/12 0500 11/05/12 0500  Weight: 63.8 kg (140 lb 10.5 oz) 65 kg (143 lb 4.8 oz) 61.9 kg (136 lb 7.4 oz)    Intake/Output:   Intake/Output Summary (Last 24 hours) at 11/05/12 0706 Last data filed at 11/05/12 0600  Gross per 24 hour  Intake 2049.79 ml  Output   2300 ml  Net -250.21 ml     Physical Exam:  General: Fatigues appearing. No resp difficulty  HEENT: normal  Neck: supple. JVP 5-6  cm Carotids 2+ bilat; no bruits. No lymphadenopathy or thryomegaly appreciated.  Cor: PMI nondisplaced. Tachycardic, regular rate & regular rhythm. No rubs, gallops or murmurs.  Lungs: clear  Abdomen: soft, nontender, mild distention. No hepatosplenomegaly. No bruits or masses. Good bowel sounds.  Extremities: no cyanosis, clubbing, rash, edema  Neuro: alert & orientedx3, cranial nerves grossly intact. moves all 4 extremities w/o difficulty. Affect pleasant  Telemetry: ST 100s  Labs: Basic Metabolic Panel:  Recent Labs Lab 10/29/12 0904 10/30/12 0430  11/01/12 0500 11/02/12 0430 11/03/12 0400 11/04/12 0425 11/04/12 1611 11/05/12 0400  NA 134* 137  < > 134* 136 139 137  --  138  K 4.5 3.4*  < >  4.6 3.5 3.4* 3.9  --  4.6  CL 99 99  < > 97 98 98 99  --  101  CO2 24 27  < > 24 26 28 25   --  28  GLUCOSE 168* 137*  < > 232* 232* 163* 127*  --  146*  BUN 9 15  < > 11 12 15 20   --  22  CREATININE 0.86 1.13*  < > 0.76 0.73 0.73 0.89  --  0.94  CALCIUM 9.2 9.0  < > 8.5 8.5 9.2 9.4  --  9.9  MG  --  1.1*  --  1.5  --   --  1.4* 2.4  --   < > = values in this interval not displayed.  Liver Function Tests:  Recent Labs Lab 11/01/12 0500 11/02/12 0430  AST 81* 45*  ALT 130* 99*  ALKPHOS 166* 161*  BILITOT 0.3 0.3   PROT 6.5 6.6  ALBUMIN 3.0* 3.1*   No results found for this basename: LIPASE, AMYLASE,  in the last 168 hours No results found for this basename: AMMONIA,  in the last 168 hours  CBC:  Recent Labs Lab 10/29/12 0904  10/30/12 0430 10/31/12 0345 10/31/12 1817 11/02/12 0430 11/03/12 0400  WBC 10.8*  < > 9.1 7.9 8.3 7.8 7.5  NEUTROABS 9.3*  --   --   --   --   --   --   HGB 9.5*  < > 9.6* 8.7* 9.8* 9.3* 10.0*  HCT 29.3*  < > 28.5* 25.6* 29.4* 28.7* 30.3*  MCV 98.7  < > 96.0 95.5 96.4 97.0 96.5  PLT 187  < > 210 175 217 252 263  < > = values in this interval not displayed.  Cardiac Enzymes:  Recent Labs Lab 10/29/12 0904  TROPONINI <0.30    BNP: BNP (last 3 results)  Recent Labs  10/29/12 0904  PROBNP 6239.0*     Other results:     Imaging: No results found.   Medications:     Scheduled Medications: . aspirin  81 mg Oral Daily  . atorvastatin  40 mg Oral q1800  . cefTRIAXone (ROCEPHIN)  IV  1 g Intravenous Q24H  . digoxin  0.125 mg Oral Daily  . docusate sodium  200 mg Oral QHS  . enoxaparin (LOVENOX) injection  40 mg Subcutaneous Q24H  . fluPHENAZine  2.5 mg Oral QHS  . furosemide  40 mg Oral BID  . heparin lock flush  500 Units Intracatheter Q30 days  . lisinopril  1.25 mg Oral Daily  . loxapine  10 mg Oral QHS  . nortriptyline  100 mg Oral QHS  . potassium chloride  20 mEq Oral BID  . ramelteon  8 mg Oral QHS  . sodium chloride  10-40 mL Intracatheter Q12H  . spironolactone  12.5 mg Oral Daily  . traZODone  150 mg Oral QHS    Infusions: . sodium chloride 10 mL/hr at 11/04/12 2000  . DOBUTamine 3 mcg/kg/min (11/05/12 0000)    PRN Medications: acetaminophen, heparin lock flush, magic mouthwash, ondansetron (ZOFRAN) IV, polyvinyl alcohol, sodium chloride, sodium chloride   Assessment:   1) Acute systolic HF, EF 20%  - Suspect adriamycin toxicity 2) Breast CA  3) DM2  4) hypokalemia  5) hypomagnesia  5) Mild leukocytosis  6)  Schizophrenia 7) CAD: 50-60% LM, 80% RCA on LHC 10/31/12 8) Hypokalemia-resolved 9) Hypomagnesium  Plan/Discussion:    1) Acute systolic CHF: EF 78% by echo with  RV failure.  I think her degree of LV dysfunction is significantly out of proportion to degree of CAD, suspect Adriamycin toxicity is the main issue here.  RHC suggested significant RHC with RA pressure elevated out of proportion to PCWP.  CI remained low on milrinone.  Milrinone stopped given hypotension and low output . Dobutamine was begun and required norepinephrine which was stopped on Sunday. Co-ox 61% yesterday on dobutamine 3.   - CVP low. Continue po Lasix.  - Decrease dobutamine to 1 this morning, if tolerates will turn off later today.  Continue digoxin and spironolactone. - Add very low lisinopril 1.25 mg daily.  - portacath saline locked, using PICC  - Guarded prognosis: she is not going to be a good candidate for advanced therapies with schizophrenia and breast cancer, also RV dysfunction would make VAD difficult.  I would like to avoid IABP also if possible because we do not have a therapy for the IABP to be a bridge towards.  She may require home dobutamine. 2) ID: Now afebrile. Completing 7 days ceftriaxone for abnormal UA.  3) Diabetes: Per [facility]. 4) CAD: Significant CAD on cath Friday.  I do not think this caused her cardiomyopathy but if she recovers, she will need further evaluation of LM by IVUS. Continue ASA and statin.   Length of Stay: 7   Dalton Shirlee Latch   11/05/2012, 7:06 AM  Advanced Heart Failure Team Pager [phone] (M-F; 7a - 4p)  Please contact [facility] for night-coverage after hours (4p -7a ) and weekends on [email or web address]

## 2012-11-06 DIAGNOSIS — F209 Schizophrenia, unspecified: Secondary | ICD-10-CM

## 2012-11-06 DIAGNOSIS — K7689 Other specified diseases of liver: Secondary | ICD-10-CM

## 2012-11-06 LAB — CBC
HCT: 29.9 % — ABNORMAL LOW (ref 36.0–46.0)
Hemoglobin: 9.7 g/dL — ABNORMAL LOW (ref 12.0–15.0)
MCH: 31.4 pg (ref 26.0–34.0)
MCHC: 32.4 g/dL (ref 30.0–36.0)

## 2012-11-06 LAB — CULTURE, BLOOD (ROUTINE X 2): Culture: NO GROWTH

## 2012-11-06 LAB — BASIC METABOLIC PANEL
BUN: 19 mg/dL (ref 6–23)
Creatinine, Ser: 0.65 mg/dL (ref 0.50–1.10)
GFR calc Af Amer: >90 mL/min (ref >90)
GFR calc non Af Amer: >90 mL/min (ref >90)
Potassium: 4.6 mEq/L (ref 3.5–5.1)

## 2012-11-06 LAB — CARBOXYHEMOGLOBIN
Carboxyhemoglobin: 1.8 % — ABNORMAL HIGH (ref 0.5–1.5)
Carboxyhemoglobin: 1.4 % (ref 0.5–1.5)
Methemoglobin: 1.1 % (ref 0.0–1.5)
O2 Saturation: 51.1 %
Total hemoglobin: 11.9 g/dL — ABNORMAL LOW (ref 12.0–16.0)

## 2012-11-06 NOTE — Progress Notes (Signed)
Physical Therapy Treatment Patient Details Name: [name] MRN: [id number] DOB: July 20, 1950 Today's Date: 11/06/2012 Time: 4098-1191 PT Time Calculation (min): 20 min  PT Assessment / Plan / Recommendation  History of Present Illness 62 y/o female with a PMHx of COPD, CVA, HLD, DM2, schizophrenia and R. Breast CA (triple negative) who is currently being treated with radiation and s/p chemiotherapy (Adriamycin).    Clinical Impression Pt able to increase ambulation distance however continues to be fatigued and needs cues for safety.  Family and caregiver concerned about overall safety and pt's ability to d/c home.  Pt does live with sister however sister is elderly and unable to provide assistance.  Family requesting further therapy prior to d/c home for further strengthening and endurance and to have pt be completely independent prior to return home.      Follow Up Recommendations  SNF;Supervision/Assistance - 24 hour     Equipment Recommendations  Rolling walker with 5" wheels    Recommendations for Other Services    Frequency Min 3X/week   Progress towards PT Goals Progress towards PT goals: Progressing toward goals  Plan Discharge plan needs to be updated    Precautions / Restrictions Precautions Precautions: Fall Restrictions Weight Bearing Restrictions: No   Pertinent Vitals/Pain No c/o pain    Mobility  Bed Mobility Bed Mobility: Not assessed Transfers Transfers: Sit to Stand;Stand to Sit Sit to Stand: 4: Min assist;From chair/3-in-1 Stand to Sit: 4: Min assist;To chair/3-in-1 Details for Transfer Assistance: (A) to initiate transfer with cues for hand placement Ambulation/Gait Ambulation/Gait Assistance: 4: Min guard Ambulation Distance (Feet): 300 Feet Assistive device: Rolling walker Ambulation/Gait Assistance Details: No AD however will benefit from RW for endurance and overall fatigue.  Gait Pattern: Step-through pattern;Decreased stride length Gait  velocity: slow General Gait Details: pt with report of SOB, RR at 17 Stairs: Yes Stairs Assistance: 4: Min assist Stairs Assistance Details (indicate cue type and reason): (A) to maintain balance with cues for step sequence Stair Management Technique: One rail Left;Forwards Number of Stairs: 2    Exercises     PT Diagnosis:    PT Problem List:   PT Treatment Interventions:     PT Goals (current goals can now be found in the care plan section) Acute Rehab PT Goals Patient Stated Goal: home PT Goal Formulation: With patient/family Time For Goal Achievement: 11/12/12 Potential to Achieve Goals: Good  Visit Information  Last PT Received On: 11/06/12 Assistance Needed: +1 History of Present Illness: 62 y/o female with a PMHx of COPD, CVA, HLD, DM2, schizophrenia and R. Breast CA (triple negative) who is currently being treated with radiation and s/p chemiotherapy (Adriamycin).     Subjective Data  Subjective: "I'm getting tired.  I need to sit down." Patient Stated Goal: home   Cognition  Cognition Arousal/Alertness: Awake/alert Behavior During Therapy: WFL for tasks assessed/performed Overall Cognitive Status: History of cognitive impairments - at baseline    Balance     End of Session PT - End of Session Equipment Utilized During Treatment: Gait belt Activity Tolerance: Patient tolerated treatment well Patient left: in chair;with call bell/phone within reach Nurse Communication: Mobility status   GP     Shaneese Tait 11/06/2012, 3:49 PM  Jake Shark, PT DPT [phone]

## 2012-11-06 NOTE — Progress Notes (Signed)
[facility] Progress Note [place] TEAM 1 - Stepdown/ICU TEAM   [name] ZOX:[id number] DOB: December 09, 1950 DOA: 10/29/2012 PCP: [name], MD  Brief narrative: 62 y/o female with a PMHx of COPD, CVA, HLD, DM2, schizophrenia and R Breast CA (triple negative) who is being treated with radiation and is s/p chemotherapy (Adriamycin).  She presented to [facility] ED 7/16 with SOB and a dry cough. She did not complain of any fevers, chills, n/v or edema. When she presented to the ED she had tachycardia, tachypnea and hypotension. She had a CT of the chest that ruled out PE, but revealed pericardial and pleural effusions. A TTE at [facility] showed EF 20% with global hypokinesis, moderate RV dysfunction, and a small pericardial effusion.   Assessment/Plan:  Acute systolic CHF - EF 20% - suspected adriamycin induced Care as per CHF Team - 13L to date   CAD 60-70% distal left main, 80% long mid RCA - care as per Cardiology/CHF Team  Breast CA status post 4 cycles of Adriamycin and Cytoxan followed by weekly Taxol x 12 doses - now on Arimidex 1 mg daily for 5 years  Anemia of chronic illness Hgb stable/improving - cont to follow   Hx of COPD and continued tobacco use Well compensated at present   DM2 - no change in tx today - cont to follow trend   Hypokalemia Due to diuretic - replaced to normal level - follow   Hypomagnesemia Replaced to normal level   Schizophrenia Have now resumed all of her usual home meds - appears well compensated at this time  UTI UA was abnorm, but culture did not reveal the offending pathogen - will complete 7 days of empiric abx tx  DVT prophylaxis Continue lovenox  Code Status: FULL Family Communication: spoke w/ sister at bedside  Disposition Plan: SDU  Consultants: Cardiolgoy PCCM >> TRH  Procedures: 7/18 - cardiac cath - 60-70% distal left main, 80% long mid RCA 7/17 - PICC per IR RUE  Antibiotics: Rocephin 7/18  >>7/24  HPI/Subjective: Pt sitting in chair- smiling- no complaints other than pain in left first toe.   Objective: Blood pressure 96/54, pulse 99, temperature 98.1 F (36.7 C), temperature source Oral, resp. rate 17, height 5\' 5"  (1.651 m), weight 61.8 kg (136 lb 3.9 oz), SpO2 95.00%.  Intake/Output Summary (Last 24 hours) at 11/06/12 1607 Last data filed at 11/06/12 1400  Gross per 24 hour  Intake 1957.77 ml  Output   1451 ml  Net 506.77 ml    Exam: General: No acute respiratory distress at rest Lungs: CTA th/o w/o wheeze  Cardiovascular: Regular rate and rhythm without murmur gallop or rub  Abdomen: Nontender, nondistended, soft, bowel sounds positive, no rebound, no ascites, no appreciable mass Extremities: No significant cyanosis, clubbing bilateral lower extremities- left first toe only mildly tender at PIP joint- no signs of gout.   Data Reviewed: Basic Metabolic Panel:  Recent Labs Lab 11/01/12 0500 11/02/12 0430 11/03/12 0400 11/04/12 0425 11/04/12 1611 11/05/12 0400 11/06/12 0435  NA 134* 136 139 137  --  138 137  K 4.6 3.5 3.4* 3.9  --  4.6 4.6  CL 97 98 98 99  --  101 103  CO2 24 26 28 25   --  28 27  GLUCOSE 232* 232* 163* 127*  --  146* 145*  BUN 11 12 15 20   --  22 19  CREATININE 0.76 0.73 0.73 0.89  --  0.94 0.65  CALCIUM 8.5  8.5 9.2 9.4  --  9.9 9.9  MG 1.5  --   --  1.4* 2.4  --   --    Liver Function Tests:  Recent Labs Lab 11/01/12 0500 11/02/12 0430  AST 81* 45*  ALT 130* 99*  ALKPHOS 166* 161*  BILITOT 0.3 0.3  PROT 6.5 6.6  ALBUMIN 3.0* 3.1*   CBC:  Recent Labs Lab 10/31/12 0345 10/31/12 1817 11/02/12 0430 11/03/12 0400 11/06/12 0435  WBC 7.9 8.3 7.8 7.5 8.8  HGB 8.7* 9.8* 9.3* 10.0* 9.7*  HCT 25.6* 29.4* 28.7* 30.3* 29.9*  MCV 95.5 96.4 97.0 96.5 96.8  PLT 175 217 252 263 351   BNP (last 3 results)  Recent Labs  10/29/12 0904  PROBNP 6239.0*   CBG:  Recent Labs Lab 11/03/12 1648 11/03/12 2204 11/04/12 1205  11/04/12 1649 11/04/12 2150  GLUCAP 102* 121* 213* 122* 176*    Recent Results (from the past 240 hour(s))  MRSA PCR SCREENING     Status: None   Collection Time    10/29/12  5:30 PM      Result Value Range Status   MRSA by PCR NEGATIVE  NEGATIVE Final   Comment:            The GeneXpert MRSA Assay (FDA     approved for NASAL specimens     only), is one component of a     comprehensive MRSA colonization     surveillance program. It is not     intended to diagnose MRSA     infection nor to guide or     monitor treatment for     MRSA infections.  URINE CULTURE     Status: None   Collection Time    10/30/12  8:41 AM      Result Value Range Status   Specimen Description URINE, CATHETERIZED   Final   Special Requests NONE   Final   Culture  Setup Time 10/30/2012 10:05   Final   Colony Count NO GROWTH   Final   Culture NO GROWTH   Final   Report Status 10/31/2012 FINAL   Final  CULTURE, BLOOD (ROUTINE X 2)     Status: None   Collection Time    10/31/12  1:30 AM      Result Value Range Status   Specimen Description BLOOD LEFT ARM   Final   Special Requests BOTTLES DRAWN AEROBIC ONLY 10CC   Final   Culture  Setup Time 10/31/2012 09:43   Final   Culture NO GROWTH 5 DAYS   Final   Report Status 11/06/2012 FINAL   Final  CULTURE, BLOOD (ROUTINE X 2)     Status: None   Collection Time    10/31/12  1:40 AM      Result Value Range Status   Specimen Description BLOOD LEFT HAND   Final   Special Requests BOTTLES DRAWN AEROBIC ONLY 5CC   Final   Culture  Setup Time 10/31/2012 09:43   Final   Culture NO GROWTH 5 DAYS   Final   Report Status 11/06/2012 FINAL   Final     Studies:  Recent x-ray studies have been reviewed in detail by the Attending Physician  Scheduled Meds:  Scheduled Meds: . aspirin  81 mg Oral Daily  . atorvastatin  40 mg Oral q1800  . digoxin  0.125 mg Oral Daily  . docusate sodium  200 mg Oral QHS  . enoxaparin (LOVENOX) injection  40  mg Subcutaneous  Q24H  . fluPHENAZine  2.5 mg Oral QHS  . heparin lock flush  500 Units Intracatheter Q30 days  . lisinopril  1.25 mg Oral Daily  . loxapine  10 mg Oral QHS  . nortriptyline  100 mg Oral QHS  . potassium chloride  20 mEq Oral BID  . ramelteon  8 mg Oral QHS  . sodium chloride  10-40 mL Intracatheter Q12H  . spironolactone  12.5 mg Oral Daily  . traZODone  150 mg Oral QHS    Time spent on care of this patient:   [facility]  [facility] Office  [phone] Pager - Text Page per Loretha Stapler as per below:  On-Call/Text Page:      [email or web address]      password [email or web address]  If 7PM-7AM, please contact night-coverage [email or web address] Password [email or web address] 11/06/2012, 4:07 PM   LOS: 8 days

## 2012-11-06 NOTE — Progress Notes (Signed)
Clinical Social Work Department BRIEF PSYCHOSOCIAL ASSESSMENT 11/06/2012  Patient:  [name], [name]     Account Number:  [id number]     Admit date:  10/29/2012  Clinical Social Worker:  Robin Searing  Date/Time:  11/06/2012 04:08 PM  Referred by:  Care Management  Date Referred:  11/06/2012 Referred for  SNF Placement   Other Referral:   Interview type:  Family Other interview type:   Spoke with sister- [name] [phone]    PSYCHOSOCIAL DATA Living Status:  FAMILY Admitted from facility:   Level of care:   Primary support name:  sister Primary support relationship to patient:  FAMILY Degree of support available:   good    CURRENT CONCERNS Current Concerns  Post-Acute Placement   Other Concerns:    SOCIAL WORK ASSESSMENT / PLAN Referred to this CSW today @1500  for SNF placement. I have spoken with the family and have completed and faxed out [facility] [place]- family [place] [facility] but understands our offers will be limited due to the Dobutamine drip and the [employer].  patient with history of Schiszophrenia and bipoloar d/o- this will require a 30 day note for a temproary PASARR and/or a Level 2 screening-   Assessment/plan status:  Other - See comment Other assessment/ plan:   FL2 and Pasarr for SNF   Information/referral to community resources:   SNF  EMS  [facility] Medicare    PATIENT'S/FAMILY'S RESPONSE TO PLAN OF CARE: Family appreciiative of assistance-       Reece Levy, MSW [phone] \

## 2012-11-06 NOTE — Progress Notes (Addendum)
Clinical Social Work Department CLINICAL SOCIAL WORK PLACEMENT NOTE 11/06/2012  Patient:  [name], [name]  Account Number:  [id number] Admit date:  10/29/2012  Clinical Social Worker:  Robin Searing  Date/time:  11/06/2012 04:15 PM  Clinical Social Work is seeking post-discharge placement for this patient at the following level of care:   SKILLED NURSING   (*CSW will update this form in Epic as items are completed)   11/06/2012  Patient/family provided with [facility] Department of Clinical Social Work's list of facilities offering this level of care within the geographic area requested by the patient (or if unable, by the patient's family).  11/06/2012  Patient/family informed of their freedom to choose among providers that offer the needed level of care, that participate in Medicare, Medicaid or managed care program needed by the patient, have an available bed and are willing to accept the patient.  11/06/2012  Patient/family informed of [facility]' ownership interest in [facility], as well as of the fact that they are under no obligation to receive care at this facility.  PASARR submitted to EDS on 11/06/2012 PASARR number received from EDS on   FL2 transmitted to all facilities in geographic area requested by pt/family on   FL2 transmitted to all facilities within larger geographic area on   Patient informed that his/her managed care company has contracts with or will negotiate with  certain facilities, including the following:     Patient/family informed of bed offers received:  11-06-12 Patient chooses bed at [facility]  Physician recommends and patient chooses bed at    Patient to be transferred to [facility] on 11-07-12  Patient to be transferred to facility by [facility]  The following physician request were entered in Epic:   Additional Comments: Reece Levy, MSW [phone] \

## 2012-11-06 NOTE — Progress Notes (Signed)
Patient ID: [name], female   DOB: 10-19-1950, 62 y.o.   MRN: [id number] Advanced Heart Failure Rounding Note   Subjective:    [name] is a 62 y/o female with a PMHx of COPD, CVA, HLD, DM2, schizophrenia and R. Breast CA (triple negative) who is currently being treated with radiation and s/p chemiotherapy (Adriamycin).   She presented to [facility] ED yesterday am with SOB with a dry cough. She did not complain of any fevers, chills, n/v or edema. When she presented to the ED she had tachycardia, tachypnea and hypotension. She had a CT of the chest that r/o PE, but showed pericardial and pleural effusion.   ECHO: Reviewed echo from [facility]. Showed EF 20% with global hypokinesis, moderate RV dysfunction, small pericardial effusion.   RHC/LHC 7/18 RA mean 18  RV 45/20  PA 41/21, mean 29  PCWP mean 21  LV 95/16  AO 109/67  Oxygen saturations:  PA 40%  AO 95%  Cardiac Output (Fick) 3.5  Cardiac Index (Fick) 2.05  Cardiac Output (Thermo) 2.9  Cardiac Index (Thermo) 1.7  SVR 1799  Coronary angiography:  Left mainstem: 60-70% distal left main stenosis.  Left anterior descending (LAD): Luminal irregularities in the LAD. 80% ostial stenosis small D1.  Left circumflex (LCx): Small vessel, no significant disease.  Right coronary artery (RCA): Long 80% mid RCA stenosis.  Left ventriculography: EF 25-30%, global hypokinesis.   Friday after cath, milrinone was stopped and dobutamine begun given hypotension requiring norephinephrine. On Sunday, Norepinephrine stopped and dobutamine increased to 5 mcg.  Spironolactone and digoxin added.    Co-ox yesterday dropped after dobutamine was stopped. Dobutamine restarted at 3 mcg. CO-OX this am 63.   CVP 1 this am. She remains on po lasix. Weight unchanged  Denies SOB/PND/Orthopnea.    Objective:   Weight Range:  Vital Signs:   Temp:  [97.6 F (36.4 C)-99 F (37.2 C)] 98.3 F (36.8 C) (07/24 0400) Pulse Rate:  [102-114] 113  (07/24 0432) Resp:  [12-24] 24 (07/24 0432) BP: (82-108)/(48-73) 92/51 mmHg (07/24 0432) SpO2:  [90 %-100 %] 97 % (07/24 0432) Weight:  [136 lb 3.9 oz (61.8 kg)] 136 lb 3.9 oz (61.8 kg) (07/24 0500) Last BM Date: 11/05/12  Weight change: Filed Weights   11/04/12 0500 11/05/12 0500 11/06/12 0500  Weight: 143 lb 4.8 oz (65 kg) 136 lb 7.4 oz (61.9 kg) 136 lb 3.9 oz (61.8 kg)    Intake/Output:   Intake/Output Summary (Last 24 hours) at 11/06/12 0653 Last data filed at 11/06/12 0600  Gross per 24 hour  Intake 1844.81 ml  Output   2053 ml  Net -208.19 ml     Physical Exam:  General: Resting in bed.  No resp difficulty  HEENT: normal  Neck: supple. JVP 5-6  cm Carotids 2+ bilat; no bruits. No lymphadenopathy or thryomegaly appreciated.  Cor: PMI nondisplaced. Tachycardic, regular rate & regular rhythm. No rubs, gallops or murmurs.  Lungs: clear  Abdomen: soft, nontender, mild distention. No hepatosplenomegaly. No bruits or masses. Good bowel sounds.  Extremities: no cyanosis, clubbing, rash, edema  Neuro: alert & orientedx3, cranial nerves grossly intact. moves all 4 extremities w/o difficulty. Affect pleasant  Telemetry: ST 100s  Labs: Basic Metabolic Panel:  Recent Labs Lab 11/01/12 0500 11/02/12 0430 11/03/12 0400 11/04/12 0425 11/04/12 1611 11/05/12 0400 11/06/12 0435  NA 134* 136 139 137  --  138 137  K 4.6 3.5 3.4* 3.9  --  4.6 4.6  CL 97 98 98 99  --  101 103  CO2 24 26 28 25   --  28 27  GLUCOSE 232* 232* 163* 127*  --  146* 145*  BUN 11 12 15 20   --  22 19  CREATININE 0.76 0.73 0.73 0.89  --  0.94 0.65  CALCIUM 8.5 8.5 9.2 9.4  --  9.9 9.9  MG 1.5  --   --  1.4* 2.4  --   --     Liver Function Tests:  Recent Labs Lab 11/01/12 0500 11/02/12 0430  AST 81* 45*  ALT 130* 99*  ALKPHOS 166* 161*  BILITOT 0.3 0.3  PROT 6.5 6.6  ALBUMIN 3.0* 3.1*   No results found for this basename: LIPASE, AMYLASE,  in the last 168 hours No results found for this  basename: AMMONIA,  in the last 168 hours  CBC:  Recent Labs Lab 10/31/12 0345 10/31/12 1817 11/02/12 0430 11/03/12 0400 11/06/12 0435  WBC 7.9 8.3 7.8 7.5 8.8  HGB 8.7* 9.8* 9.3* 10.0* 9.7*  HCT 25.6* 29.4* 28.7* 30.3* 29.9*  MCV 95.5 96.4 97.0 96.5 96.8  PLT 175 217 252 263 351    Cardiac Enzymes: No results found for this basename: CKTOTAL, CKMB, CKMBINDEX, TROPONINI,  in the last 168 hours  BNP: BNP (last 3 results)  Recent Labs  10/29/12 0904  PROBNP 6239.0*     Other results:     Imaging: No results found.   Medications:     Scheduled Medications: . aspirin  81 mg Oral Daily  . atorvastatin  40 mg Oral q1800  . cefTRIAXone (ROCEPHIN) IVPB 1 gram/50 mL D5W  1 g Intravenous Q24H  . digoxin  0.125 mg Oral Daily  . docusate sodium  200 mg Oral QHS  . enoxaparin (LOVENOX) injection  40 mg Subcutaneous Q24H  . fluPHENAZine  2.5 mg Oral QHS  . furosemide  40 mg Oral BID  . heparin lock flush  500 Units Intracatheter Q30 days  . lisinopril  1.25 mg Oral Daily  . loxapine  10 mg Oral QHS  . nortriptyline  100 mg Oral QHS  . potassium chloride  20 mEq Oral BID  . ramelteon  8 mg Oral QHS  . sodium chloride  10-40 mL Intracatheter Q12H  . spironolactone  12.5 mg Oral Daily  . traZODone  150 mg Oral QHS    Infusions: . sodium chloride 10 mL/hr at 11/04/12 2000  . DOBUTamine 3 mcg/kg/min (11/05/12 2125)    PRN Medications: acetaminophen, heparin lock flush, magic mouthwash, magnesium hydroxide, ondansetron (ZOFRAN) IV, polyvinyl alcohol, sodium chloride, sodium chloride   Assessment:   1) Acute systolic HF, EF 20%  - Suspect adriamycin toxicity 2) Breast CA  3) DM2  4) hypokalemia  5) hypomagnesia  5) Mild leukocytosis  6) Schizophrenia 7) CAD: 50-60% LM, 80% RCA on LHC 10/31/12 8) Hypokalemia-resolved 9) Hypomagnesium  Plan/Discussion:    1) Acute systolic CHF: EF 16% by echo with RV failure.  I think her degree of LV dysfunction is  significantly out of proportion to degree of CAD, suspect Adriamycin toxicity is the main issue here.  RHC suggested significant RHC with RA pressure elevated out of proportion to PCWP.  CI remained low on milrinone.  Milrinone stopped given hypotension and low output . Dobutamine was begun and required norepinephrine which was stopped on Sunday. Co-ox  yesterday dropped off Dobutamine. Restarted at 3 mcg. CO--OX this am 63 on dobutamine 3.  She is  on digoxin.  - CVP 1. Hold lasix today - Decrease dobutamine to 2 this morning. Repeat  CO-OX at 11:00.   Continue digoxin and spironolactone. - BP soft. Continue low dose lisinopril 1.25 mg daily.  - portacath saline locked, using PICC  - Guarded prognosis: she is not going to be a good candidate for advanced therapies with schizophrenia and breast cancer, also RV dysfunction would make VAD difficult.  I would like to avoid IABP also if possible because we do not have a therapy for the IABP to be a bridge towards.  She may require home dobutamine. 2) ID: Now afebrile. Completing 7 days ceftriaxone for abnormal UA.  3) Diabetes: Per [facility]. 4) CAD: Significant CAD on cath Friday.  I do not think this caused her cardiomyopathy but if she recovers, she will need further evaluation of LM by IVUS. Continue ASA and statin.   Length of Stay: 8   CLEGG,AMY  NP-C 11/06/2012, 6:53 AM  Advanced Heart Failure Team Pager [phone] (M-F; 7a - 4p)  Please contact [facility] for night-coverage after hours (4p -7a ) and weekends on [email or web address]  Patient seen with NP, agree with the above note.  Will try to bring dobutamine down slowly, decrease to 2 mcg/kg/min today and check co-ox.  Hold Lasix with low CVP.  She needs to ambulate.   Marca Ancona 11/06/2012 8:20 AM

## 2012-11-07 LAB — BASIC METABOLIC PANEL
Chloride: 106 mEq/L (ref 96–112)
GFR calc Af Amer: >90 mL/min (ref >90)
Potassium: 5 mEq/L (ref 3.5–5.1)

## 2012-11-07 LAB — CARBOXYHEMOGLOBIN
Carboxyhemoglobin: 1.7 % — ABNORMAL HIGH (ref 0.5–1.5)
Methemoglobin: 0.6 % (ref 0.0–1.5)
O2 Saturation: 73.4 %
Total hemoglobin: 9.5 g/dL — ABNORMAL LOW (ref 12.0–16.0)

## 2012-11-07 MED ORDER — FUROSEMIDE 40 MG PO TABS: 40.0000 mg | ORAL_TABLET | Freq: Every day | ORAL | Status: AC

## 2012-11-07 MED ORDER — HEPARIN SOD (PORK) LOCK FLUSH 100 UNIT/ML IV SOLN: 500.0000 [IU] | INTRAVENOUS | Status: DC

## 2012-11-07 MED ORDER — POTASSIUM CHLORIDE CRYS ER 20 MEQ PO TBCR: 20.0000 meq | EXTENDED_RELEASE_TABLET | Freq: Every day | ORAL | Status: DC

## 2012-11-07 MED ORDER — DOBUTAMINE IN D5W 4-5 MG/ML-% IV SOLN: 3.0000 ug/kg/min | INTRAVENOUS | Status: DC

## 2012-11-07 MED ORDER — POLYVINYL ALCOHOL 1.4 % OP SOLN: 1.0000 [drp] | Freq: Two times a day (BID) | OPHTHALMIC | Status: AC | PRN

## 2012-11-07 MED ORDER — FUROSEMIDE 40 MG PO TABS
40.0000 mg | ORAL_TABLET | Freq: Every day | ORAL | Status: DC
  Administered 2012-11-07: 40 mg via ORAL

## 2012-11-07 MED ORDER — ACETAMINOPHEN 325 MG PO TABS: 650.0000 mg | ORAL_TABLET | ORAL | Status: DC | PRN

## 2012-11-07 MED ORDER — SPIRONOLACTONE 12.5 MG HALF TABLET: 12.5000 mg | ORAL_TABLET | Freq: Every day | ORAL | Status: DC

## 2012-11-07 MED ORDER — ATORVASTATIN CALCIUM 40 MG PO TABS: 40.0000 mg | ORAL_TABLET | Freq: Every day | ORAL | Status: DC

## 2012-11-07 MED ORDER — ASPIRIN 81 MG PO CHEW: 81.0000 mg | CHEWABLE_TABLET | Freq: Every day | ORAL | Status: AC

## 2012-11-07 MED ORDER — LISINOPRIL 2.5 MG PO TABS: 1.2500 mg | ORAL_TABLET | Freq: Every day | ORAL | Status: DC

## 2012-11-07 MED ORDER — DIGOXIN 125 MCG PO TABS: 0.1250 mg | ORAL_TABLET | Freq: Every day | ORAL | Status: DC

## 2012-11-07 MED ORDER — HEPARIN SOD (PORK) LOCK FLUSH 100 UNIT/ML IV SOLN: 500.0000 [IU] | INTRAVENOUS | Status: DC | PRN

## 2012-11-07 NOTE — Progress Notes (Signed)
Pt's son, brother, and sister were bedside when I arrived. Pt was sitting up in chair. When I first arrived she said she had not requested Chaplain/advance directives. Pt's brother said they had talked about one and his other brother had begun the paperwork. Pt's sister said that brother was out of town w/another sister who is having a procedure done and therefore he would not be here today. I explained to pt what advance directives means and a living will. She said she did not have any questions and seemed to understand. However, patient said she did not know if she was ready to do it and I told patient that means wait and we will be happy to help her whenever she knows she is ready.  Pt's sister wanted me to anoint and pray for her and then she asked me to anoint and pray for patient. I offered anointing and prayer to patient and she said yes and was grateful for visit and prayer. Family was expressively thankful for visit and information. According to pt's sister, pt will be released today.  Marjory Lies Chaplain  11/07/12 1200  Clinical Encounter Type  Visited With Patient and family together

## 2012-11-07 NOTE — Discharge Summary (Signed)
DISCHARGE SUMMARY  MATISON NUCCIO  MR#: [id number]  DOB:[birth year]  Date of Admission: 10/29/2012 Date of Discharge: 11/07/2012  Attending Physician:MCCLUNG,JEFFREY T  Patient's NWG:NFAOZHY,QMVHQI L, MD  Consults: [facility] Team  Disposition: D/C to SNF  Follow-up Appts:     Follow-up Information   Follow up with CLEGG,AMY, NP On 11/14/2012. (9:00 am Garage Code 1000)    Contact information:   [address] [place] [address] [phone]      Tests Needing Follow-up: BMET should be assessed to evaluate K+ and crt within 5 days  Discharge Diagnoses: Acute systolic CHF - EF 20% - suspected adriamycin induced  CAD  Breast CA  Anemia of chronic illness  Hx of COPD and continued tobacco use  DM2  Hypokalemia  Hypomagnesemia  Schizophrenia  UTI   Initial presentation: 62 y/o female with a PMHx of COPD, CVA, HLD, DM2, schizophrenia and R Breast CA (triple negative) who is being treated with radiation and is s/p chemotherapy (Adriamycin).  She presented to [facility] ED 7/16 with SOB and a dry cough. She did not complain of any fevers, chills, n/v or edema. When she presented to the ED she had tachycardia, tachypnea and hypotension. She had a CT of the chest that ruled out PE, but revealed pericardial and pleural effusions. A TTE at [facility] showed EF 20% with global hypokinesis, moderate RV dysfunction, and a small pericardial effusion.  Hospital Course:  Acute systolic CHF - EF 20% - suspected adriamycin induced  Care was directed by the CHF Team - pt has been cleared for d/c by the CHF Team on continous dobutamine infusion - the following is the final note from the CHF Team:  "EF 20% by echo with RV failure. I think her degree of LV dysfunction is significantly out of proportion to degree of CAD, suspect Adriamycin toxicity is the main issue here. RHC suggested significant RHC with RA pressure elevated out of proportion to PCWP. CI remained low on milrinone.  Milrinone stopped given hypotension and low output . Dobutamine was begun and required norepinephrine which was stopped on Sunday. Failed dobutamine wean again. Now inotrope dependent. CO-OX improved with Dobutamine at 3 mcg. CO-OX this am 73 on dobutamine 3. She is on digoxin.  - CVP 3. Add lasix 40 mg daily  - Continue dobutamine at 3 mcg per hour. Continue digoxin and spironolactone. Will require Dobutamine 3 mcg per hour.  - Continue low dose lisinopril 1.25 mg daily.  No beta blocker."  Of note - pt and family prefer to keep PICC line and to use it for her dobutamine gtt as opposed to using the Port-A-Cath.  CAD  60-70% distal left main, 80% long mid RCA - care as per Cardiology/CHF Team who stated the following: "Significant CAD on cath. I do not think this caused her cardiomyopathy but if she recovers, she will need further evaluation of LM by IVUS. Continue ASA and statin."  Breast CA  status post 4 cycles of Adriamycin and Cytoxan followed by weekly Taxol x 12 doses - now on Arimidex 1 mg daily for 5 years   Anemia of chronic illness  Hgb stable during this stay w/ Hgb range 8.7-10.0  Hx of COPD and continued tobacco use  Well compensated during this hospital stay   DM2  CBG reasonably well controlled during this hospital stay   Hypokalemia  Due to diuretic - replaced to normal level prior to d/c - should follow as ouptpt  Hypomagnesemia  Replaced  to normal level   Schizophrenia  Resumed all of her usual home meds - well compensated at time of d/c   UTI  UA was abnorm, but culture did not reveal the offending pathogen - completed 7 days of empiric abx tx      Medication List    STOP taking these medications       HYDROcodone-acetaminophen 5-325 MG per tablet  Commonly known as:  NORCO/VICODIN     lidocaine-prilocaine cream  Commonly known as:  EMLA     magic mouthwash Soln     metFORMIN 500 MG tablet  Commonly known as:  GLUCOPHAGE     potassium chloride  SA 20 MEQ tablet  Commonly known as:  K-DUR,KLOR-CON      TAKE these medications       acetaminophen 325 MG tablet  Commonly known as:  TYLENOL  Take 2 tablets (650 mg total) by mouth every 4 (four) hours as needed.     anastrozole 1 MG tablet  Commonly known as:  ARIMIDEX  Take 1 mg by mouth daily.     aspirin 81 MG chewable tablet  Chew 1 tablet (81 mg total) by mouth daily.     atorvastatin 40 MG tablet  Commonly known as:  LIPITOR  Take 1 tablet (40 mg total) by mouth daily at 6 PM.     digoxin 0.125 MG tablet  Commonly known as:  LANOXIN  Take 1 tablet (0.125 mg total) by mouth daily.     DOBUTamine 4-5 MG/ML-% infusion  Commonly known as:  DOBUTREX  Inject 185.7 mcg/min into the vein continuous.     docusate sodium 100 MG capsule  Commonly known as:  COLACE  Take 100 mg by mouth at bedtime.     fluPHENAZine 2.5 MG tablet  Commonly known as:  PROLIXIN  Take 2.5 mg by mouth at bedtime.     furosemide 40 MG tablet  Commonly known as:  LASIX  Take 1 tablet (40 mg total) by mouth daily.     heparin lock flush 100 UNIT/ML Soln  5 mLs (500 Units total) by Intracatheter route every 30 (thirty) days.     heparin lock flush 100 UNIT/ML Soln  5 mLs (500 Units total) by Intracatheter route as needed (line care).     lisinopril 2.5 MG tablet  Commonly known as:  PRINIVIL,ZESTRIL  Take 0.5 tablets (1.25 mg total) by mouth daily.     loxapine 10 MG capsule  Commonly known as:  LOXITANE  Take 10 mg by mouth at bedtime.     nortriptyline 50 MG capsule  Commonly known as:  PAMELOR  Take 100 mg by mouth at bedtime.     polyvinyl alcohol 1.4 % ophthalmic solution  Commonly known as:  LIQUIFILM TEARS  Place 1 drop into both eyes 2 (two) times daily as needed.     ramelteon 8 MG tablet  Commonly known as:  ROZEREM  Take 8 mg by mouth at bedtime.     spironolactone 12.5 mg Tabs  Commonly known as:  ALDACTONE  Take 0.5 tablets (12.5 mg total) by mouth daily.      SYSTANE BALANCE 0.6 % Soln  Generic drug:  Propylene Glycol  Apply 1 drop to eye 2 (two) times daily as needed (dry eyes).     traZODone 150 MG tablet  Commonly known as:  DESYREL  Take 150 mg by mouth at bedtime.        Day of Discharge BP 121/72  Pulse 76  Temp(Src) 97.8 F (36.6 C) (Oral)  Resp 20  Ht 5\' 5"  (1.651 m)  Wt 61.9 kg (136 lb 7.4 oz)  BMI 22.71 kg/m2  SpO2 96%  Physical Exam: General: No acute respiratory distress Lungs: Clear to auscultation bilaterally without wheezes or crackles Cardiovascular: Regular rate and rhythm without murmur gallop or rub normal S1 and S2 Abdomen: Nontender, nondistended, soft, bowel sounds positive, no rebound, no ascites, no appreciable mass Extremities: No significant cyanosis, clubbing bilateral lower extremities - trace B pedal edema  Results for orders placed during the hospital encounter of 10/29/12 (from the past 24 hour(s))  CARBOXYHEMOGLOBIN     Status: Abnormal   Collection Time    11/06/12  3:10 PM      Result Value Range   Total hemoglobin 11.9 (*) 12.0 - 16.0 g/dL   O2 Saturation 82.9     Carboxyhemoglobin 1.4  0.5 - 1.5 %   Methemoglobin 1.1  0.0 - 1.5 %  BASIC METABOLIC PANEL     Status: Abnormal   Collection Time    11/07/12  5:30 AM      Result Value Range   Sodium 139  135 - 145 mEq/L   Potassium 5.0  3.5 - 5.1 mEq/L   Chloride 106  96 - 112 mEq/L   CO2 26  19 - 32 mEq/L   Glucose, Bld 117 (*) 70 - 99 mg/dL   BUN 13  6 - 23 mg/dL   Creatinine, Ser 5.62  0.50 - 1.10 mg/dL   Calcium 9.8  8.4 - 13.0 mg/dL   GFR calc non Af Amer >90  >90 mL/min   GFR calc Af Amer >90  >90 mL/min  CARBOXYHEMOGLOBIN     Status: Abnormal   Collection Time    11/07/12  5:33 AM      Result Value Range   Total hemoglobin 9.5 (*) 12.0 - 16.0 g/dL   O2 Saturation 86.5     Carboxyhemoglobin 1.7 (*) 0.5 - 1.5 %   Methemoglobin 0.6  0.0 - 1.5 %    Time spent in discharge (includes decision making & examination of pt): 35+  minutes  11/07/2012, 1:57 PM   Lonia Blood, MD [facility] Office  [phone] Pager [phone]  On-Call/Text Page:      [email or web address]      password [facility]

## 2012-11-07 NOTE — Progress Notes (Signed)
[facility]  Patient Status:   New patient to [facility]   [facility] is providing the following services:   Ms. Rockefeller will transition to [facility] SNF/Rehab.  [facility] Pharm[facility] Team will provide IV Dobutamine for pt as resident at [facility] and support transition to home if physician/family deem appropriate.  [facility] Infusion Coordinator connected pt to CADD pump for continuous Dobtumaine infusion prior to DC to SNF.  [facility] Infusion Pharmacy Team will follow and support the nursing team at [facility].  If patient discharges after hours, please call [phone].   Sedalia Muta 11/07/2012, 6:00 PM

## 2012-11-07 NOTE — Progress Notes (Signed)
Patient ID: [name], female   DOB: [birth year], 61 y.o.   MRN: [id number] Advanced Heart Failure Rounding Note   Subjective:    [name] is a 62 y/o female with a PMHx of COPD, CVA, HLD, DM2, schizophrenia and R. Breast CA (triple negative) who is currently being treated with radiation and s/p chemiotherapy (Adriamycin).   She presented to [facility] ED yesterday am with SOB with a dry cough. She did not complain of any fevers, chills, n/v or edema. When she presented to the ED she had tachycardia, tachypnea and hypotension. She had a CT of the chest that r/o PE, but showed pericardial and pleural effusion.   ECHO: Reviewed echo from [facility]. Showed EF 20% with global hypokinesis, moderate RV dysfunction, small pericardial effusion.   RHC/LHC 7/18 RA mean 18  RV 45/20  PA 41/21, mean 29  PCWP mean 21  LV 95/16  AO 109/67  Oxygen saturations:  PA 40%  AO 95%  Cardiac Output (Fick) 3.5  Cardiac Index (Fick) 2.05  Cardiac Output (Thermo) 2.9  Cardiac Index (Thermo) 1.7  SVR 1799  Coronary angiography:  Left mainstem: 60-70% distal left main stenosis.  Left anterior descending (LAD): Luminal irregularities in the LAD. 80% ostial stenosis small D1.  Left circumflex (LCx): Small vessel, no significant disease.  Right coronary artery (RCA): Long 80% mid RCA stenosis.  Left ventriculography: EF 25-30%, global hypokinesis.   Friday after cath, milrinone was stopped and dobutamine begun given hypotension requiring norephinephrine. On Sunday, Norepinephrine stopped and dobutamine increased to 5 mcg.  Spironolactone and digoxin added.    Yesterday she failed dobutamine wean. Dobutamine was increased to 3 mcg per hour. CO-OX improved from 44>51%.  CO-OX this am 73.   Diuretics held due to reduced CVP.   CVP 3 personally checked  Denies SOB/PND/Orthopnea.    Objective:   Weight Range:  Vital Signs:   Temp:  [97.8 F (36.6 C)-98.4 F (36.9 C)] 98.3 F (36.8 C)  (07/25 0413) Pulse Rate:  [99-110] 109 (07/25 0413) Resp:  [13-21] 13 (07/25 0413) BP: (90-114)/(48-67) 114/67 mmHg (07/25 0413) SpO2:  [88 %-96 %] 96 % (07/25 0413) Weight:  [136 lb 7.4 oz (61.9 kg)] 136 lb 7.4 oz (61.9 kg) (07/25 0500) Last BM Date: 11/05/12  Weight change: Filed Weights   11/05/12 0500 11/06/12 0500 11/07/12 0500  Weight: 136 lb 7.4 oz (61.9 kg) 136 lb 3.9 oz (61.8 kg) 136 lb 7.4 oz (61.9 kg)    Intake/Output:   Intake/Output Summary (Last 24 hours) at 11/07/12 0656 Last data filed at 11/07/12 0600  Gross per 24 hour  Intake 1623.37 ml  Output   1650 ml  Net -26.63 ml     Physical Exam:  General: Resting in bed.  No resp difficulty  HEENT: normal  Neck: supple. JVP 5-6  cm Carotids 2+ bilat; no bruits. No lymphadenopathy or thryomegaly appreciated.  Cor: PMI nondisplaced. Tachycardic, regular rate & regular rhythm. No rubs, gallops or murmurs.  Lungs: clear  Abdomen: soft, nontender, mild distention. No hepatosplenomegaly. No bruits or masses. Good bowel sounds.  Extremities: no cyanosis, clubbing, rash, edema  Neuro: alert & orientedx3, cranial nerves grossly intact. moves all 4 extremities w/o difficulty. Affect pleasant  Telemetry: ST 100s  Labs: Basic Metabolic Panel:  Recent Labs Lab 11/01/12 0500  11/03/12 0400 11/04/12 0425 11/04/12 1611 11/05/12 0400 11/06/12 0435 11/07/12 0530  NA 134*  < > 139 137  --  138 137 139  K 4.6  < > 3.4* 3.9  --  4.6 4.6 5.0  CL 97  < > 98 99  --  101 103 106  CO2 24  < > 28 25  --  28 27 26   GLUCOSE 232*  < > 163* 127*  --  146* 145* 117*  BUN 11  < > 15 20  --  22 19 13   CREATININE 0.76  < > 0.73 0.89  --  0.94 0.65 0.66  CALCIUM 8.5  < > 9.2 9.4  --  9.9 9.9 9.8  MG 1.5  --   --  1.4* 2.4  --   --   --   < > = values in this interval not displayed.  Liver Function Tests:  Recent Labs Lab 11/01/12 0500 11/02/12 0430  AST 81* 45*  ALT 130* 99*  ALKPHOS 166* 161*  BILITOT 0.3 0.3  PROT 6.5  6.6  ALBUMIN 3.0* 3.1*   No results found for this basename: LIPASE, AMYLASE,  in the last 168 hours No results found for this basename: AMMONIA,  in the last 168 hours  CBC:  Recent Labs Lab 10/31/12 1817 11/02/12 0430 11/03/12 0400 11/06/12 0435  WBC 8.3 7.8 7.5 8.8  HGB 9.8* 9.3* 10.0* 9.7*  HCT 29.4* 28.7* 30.3* 29.9*  MCV 96.4 97.0 96.5 96.8  PLT 217 252 263 351    Cardiac Enzymes: No results found for this basename: CKTOTAL, CKMB, CKMBINDEX, TROPONINI,  in the last 168 hours  BNP: BNP (last 3 results)  Recent Labs  10/29/12 0904  PROBNP 6239.0*     Other results:     Imaging: No results found.   Medications:     Scheduled Medications: . aspirin  81 mg Oral Daily  . atorvastatin  40 mg Oral q1800  . digoxin  0.125 mg Oral Daily  . docusate sodium  200 mg Oral QHS  . enoxaparin (LOVENOX) injection  40 mg Subcutaneous Q24H  . fluPHENAZine  2.5 mg Oral QHS  . heparin lock flush  500 Units Intracatheter Q30 days  . lisinopril  1.25 mg Oral Daily  . loxapine  10 mg Oral QHS  . nortriptyline  100 mg Oral QHS  . potassium chloride  20 mEq Oral BID  . ramelteon  8 mg Oral QHS  . sodium chloride  10-40 mL Intracatheter Q12H  . spironolactone  12.5 mg Oral Daily  . traZODone  150 mg Oral QHS    Infusions: . sodium chloride 10 mL/hr at 11/04/12 2000  . DOBUTamine 3 mcg/kg/min (11/06/12 2000)    PRN Medications: acetaminophen, heparin lock flush, magic mouthwash, magnesium hydroxide, ondansetron (ZOFRAN) IV, polyvinyl alcohol, sodium chloride, sodium chloride   Assessment:   1) Acute systolic HF, EF 20%  - Suspect adriamycin toxicity 2) Breast CA  3) DM2  4) hypokalemia  5) hypomagnesia  5) Mild leukocytosis  6) Schizophrenia 7) CAD: 50-60% LM, 80% RCA on LHC 10/31/12 8) Hypokalemia-resolved 9) Hypomagnesium  Plan/Discussion:    1) Acute systolic CHF: EF 16% by echo with RV failure.  I think her degree of LV dysfunction is significantly  out of proportion to degree of CAD, suspect Adriamycin toxicity is the main issue here.  RHC suggested significant RHC with RA pressure elevated out of proportion to PCWP.  CI remained low on milrinone.  Milrinone stopped given hypotension and low output . Dobutamine was begun and required norepinephrine which was stopped on Sunday. Failed dobutamine wean gain. Now inotrope  dependent. CO-OX improved with Dobutamine at 3 mcg. CO--OX this am 73 on dobutamine 3.  She is on digoxin.  - CVP 3. Add lasix 40 mg daily - Continue dobutamine at 3 mcg per hour. Continue digoxin and spironolactone. Will required Dobutamine 3 mcg per hour.  - Continue low dose lisinopril 1.25 mg daily.  No beta blocker .  - Per cancer center ok to use porta cath for dobutamine.  Will remove PICC after dobutamine switched to porta cath.  2) ID: Now afebrile. Completing 7 days ceftriaxone for abnormal UA.  3) Diabetes: Per triad. 4) CAD: Significant CAD on cath Friday.  I do not think this caused her cardiomyopathy but if she recovers, she will need further evaluation of LM by IVUS. Continue ASA and statin.   Disposition - Home versus SNF.  HH arranged for home dobutamine if she does go home. FL2 faxed out can discharge today from HF stand point.   Follow up set up for next Friday in HF clinic with Dr Shirlee Latch   HF meds for discharge.  Dobutamine 3 mcg via porta cath Lasix 40 mg daily Digoxin 0.125 mg daily Lisinopril 1.25 mg daily Aspirin 81 mg daily Atorvastatin 40 mg daily Spironolactone 12.5 mg daily  Length of Stay: 9   CLEGG,AMY  NP-C 11/07/2012, 6:56 AM  Advanced Heart Failure Team Pager [phone] (M-F; 7a - 4p)  Please contact [facility] for night-coverage after hours (4p -7a ) and weekends on [email or web address]  Patient seen with NP, agree with the above note. We were unable to wean her off dobutamine (co-ox and BP fell).  Therefore, home on dobutamine.  Given active breast cancer and schizophrenia, she is not  going to be a good candidate for advanced therapies.  As we get further out from Adriamycin use, I hope that we can wean off dobutamine.    In terms of coronary disease, we will manage medically for now.  Would be concerned about risk for progression of CAD with chest radiation, would take all measures to ensure that field is limited and away from heart.   Marca Ancona 11/07/2012 10:10 AM

## 2012-11-07 NOTE — Clinical Social Work Note (Addendum)
Clinical Social Worker updated patient and family regarding bed offers received. Family chose [facility]. CSW will complete discharge packet and place with shadow chart. Patient will be transported via [facility] due to dobutamine drip. CSW will sign off as social work intervention is no longer needed.   14:00pm [facility] Medicare EMS authorization #- [id number]  Rozetta Nunnery MSW, [place] [phone]

## 2012-11-13 ENCOUNTER — Non-Acute Institutional Stay (SKILLED_NURSING_FACILITY): Payer: Medicare Other | Admitting: Internal Medicine

## 2012-11-13 DIAGNOSIS — I420 Dilated cardiomyopathy: Secondary | ICD-10-CM

## 2012-11-13 DIAGNOSIS — R5383 Other fatigue: Secondary | ICD-10-CM

## 2012-11-13 DIAGNOSIS — F209 Schizophrenia, unspecified: Secondary | ICD-10-CM

## 2012-11-13 DIAGNOSIS — G4700 Insomnia, unspecified: Secondary | ICD-10-CM

## 2012-11-13 DIAGNOSIS — I2510 Atherosclerotic heart disease of native coronary artery without angina pectoris: Secondary | ICD-10-CM

## 2012-11-13 DIAGNOSIS — C50919 Malignant neoplasm of unspecified site of unspecified female breast: Secondary | ICD-10-CM

## 2012-11-13 DIAGNOSIS — C50911 Malignant neoplasm of unspecified site of right female breast: Secondary | ICD-10-CM

## 2012-11-13 DIAGNOSIS — I428 Other cardiomyopathies: Secondary | ICD-10-CM

## 2012-11-13 DIAGNOSIS — R531 Weakness: Secondary | ICD-10-CM | POA: Insufficient documentation

## 2012-11-13 NOTE — Progress Notes (Signed)
Patient ID: [name], female   DOB: 1950/12/23, 62 y.o.   MRN: [id number]    PCP: [name], MD  Code Status: full code  Allergies  Allergen Reactions  . Ibuprofen Nausea Only  . Other Other (See Comments)    Ragweed Pepper - Sneezing    Chief Complaint  Patient presents with  . Hospitalization Follow-up     HPI:   62 y/o female patient is here for STR post hospital admission for STR from 10/29/12- 11/07/12. She has history of COPD, CVA, HLD, DM2, schizophrenia and R Breast CA (triple negative) who is being treated with radiation and is s/p chemotherapy (Adriamycin).  she was admitted to the hospital with CHF exacerbation. CT of the chest ruled out PE, but revealed pericardial and pleural effusions. TTE showed EF 20% with global hypokinesis, moderate RV dysfunction, and a small pericardial effusion. Cardiology was consulted. Adriamycin toxicity was suspected and she was put on dobutamine drip. She failed weaning off dobutamine and thus her inotrope was continued. Lasix was added and digoxin was continued. She was then sent to SNF She was seen in her room today. She has been walking around the building with no assistive device. Her energy level has improved. Appetite is fair  Review of Systems  Constitutional: Negative for fever, chills and weight loss.  HENT: Negative for congestion and sore throat.   Eyes: Negative for blurred vision.  Respiratory: Negative for cough, shortness of breath and wheezing.   Cardiovascular: Negative for chest pain and leg swelling.  Gastrointestinal: Negative for heartburn, nausea and vomiting.  Genitourinary: Negative for dysuria.  Musculoskeletal: Negative for myalgias.  Skin: Negative for rash.  Neurological: Negative for dizziness, loss of consciousness and headaches.  Psychiatric/Behavioral: Negative for depression and memory loss.     Past Medical History  Diagnosis Date  . Allergic rhinitis   . Bipolar disorder   . GERD  (gastroesophageal reflux disease)   . Hypokalemia   . Hyperlipemia   . COPD (chronic obstructive pulmonary disease)   . Heart palpitations   . Anemia   . Cancer   . 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   Past Surgical History  Procedure Laterality Date  . Sterilization    . Colonoscopy    . Upper gastrointestinal endoscopy    . Dilation and curettage of uterus    . Colonoscopy with esophagogastroduodenoscopy (egd)  02/21/2012    Procedure: COLONOSCOPY WITH ESOPHAGOGASTRODUODENOSCOPY (EGD);  Surgeon: Malissa Hippo, MD;  Location: [facility];  Service: Endoscopy;  Laterality: N/A;  2:25  . Right breast lumpectomy      benign/Dr. Lovell Sheehan ?1990's  . Breast biopsy  2013    right breast  . Portacath placement  04/01/2012    Procedure: INSERTION PORT-A-CATH;  Surgeon: Marlane Hatcher, MD;  Location: [facility] ORS;  Service: General;  Laterality: Left;  Insertion Port-A-Cath Left Subclavian under Fluoroscopy   Social History:   reports that she has been smoking.  She has never used smokeless tobacco. She reports that she does not drink alcohol or use illicit drugs.  Family History  Problem Relation Age of Onset  . Seizures Mother   . COPD Father   . Hypertension Sister   . COPD Sister   . Arthritis Sister   . Diabetes Brother   . Hypertension Sister   . Breast cancer Sister     61 year survivor  . Arthritis Sister   .  Thyroid disease Sister   . Arthritis Sister   . Diabetes Sister   . Arthritis Sister   . Heart disease Brother   . Healthy Brother   . Healthy Brother   . Healthy Son     Medications: Patient's Medications  New Prescriptions   No medications on file  Previous Medications   ACETAMINOPHEN (TYLENOL) 325 MG TABLET    Take 2 tablets (650 mg total) by mouth every 4 (four) hours as needed.   ANASTROZOLE (ARIMIDEX) 1 MG TABLET    Take 1 mg by mouth daily.   ASPIRIN 81 MG CHEWABLE TABLET    Chew 1 tablet (81 mg total) by mouth  daily.   ATORVASTATIN (LIPITOR) 40 MG TABLET    Take 1 tablet (40 mg total) by mouth daily at 6 PM.   DIGOXIN (LANOXIN) 0.125 MG TABLET    Take 1 tablet (0.125 mg total) by mouth daily.   DOBUTAMINE (DOBUTREX) 4-5 MG/ML-% INFUSION    Inject 185.7 mcg/min into the vein continuous.   DOCUSATE SODIUM (COLACE) 100 MG CAPSULE    Take 100 mg by mouth at bedtime.   FLUPHENAZINE (PROLIXIN) 2.5 MG TABLET    Take 2.5 mg by mouth at bedtime.   FUROSEMIDE (LASIX) 40 MG TABLET    Take 1 tablet (40 mg total) by mouth daily.   HEPARIN LOCK FLUSH 100 UNIT/ML SOLN    5 mLs (500 Units total) by Intracatheter route every 30 (thirty) days.   HEPARIN LOCK FLUSH 100 UNIT/ML SOLN    5 mLs (500 Units total) by Intracatheter route as needed (line care).   LISINOPRIL (PRINIVIL,ZESTRIL) 2.5 MG TABLET    Take 0.5 tablets (1.25 mg total) by mouth daily.   LOXAPINE (LOXITANE) 10 MG CAPSULE    Take 10 mg by mouth at bedtime.   NORTRIPTYLINE (PAMELOR) 50 MG CAPSULE    Take 100 mg by mouth at bedtime.    POLYVINYL ALCOHOL (LIQUIFILM TEARS) 1.4 % OPHTHALMIC SOLUTION    Place 1 drop into both eyes 2 (two) times daily as needed.   PROPYLENE GLYCOL (SYSTANE BALANCE) 0.6 % SOLN    Apply 1 drop to eye 2 (two) times daily as needed (dry eyes).   RAMELTEON (ROZEREM) 8 MG TABLET    Take 8 mg by mouth at bedtime.   SPIRONOLACTONE (ALDACTONE) 12.5 MG TABS    Take 0.5 tablets (12.5 mg total) by mouth daily.   TRAZODONE (DESYREL) 150 MG TABLET    Take 150 mg by mouth at bedtime.  Modified Medications   No medications on file  Discontinued Medications   No medications on file     Physical Exam: Filed Vitals:   11/13/12 1020  BP: 120/58  Pulse: 60  Temp: 97.7 F (36.5 C)  Resp: 17  Height: 5\' 5"  (1.651 m)  Weight: 137 lb (62.143 kg)  SpO2: 95%    General: No acute respiratory distress HEENT: no pallor, no icterus, no LAD, MMM, wears glasses Lungs: Clear to auscultation bilaterally without wheezes or crackles Cardiovascular:  Regular rate and rhythm without murmur gallop or rub normal S1 and S2 Abdomen: Nontender, nondistended, soft, bowel sounds positive, no rebound, no ascites, no appreciable mass Extremities: No significant cyanosis, clubbing bilateral lower extremities, trace pedal edema Skin: has left chest portacath   Labs reviewed: Basic Metabolic Panel:  Recent Labs  78/29/56 0500  11/04/12 0425 11/04/12 1611 11/05/12 0400 11/06/12 0435 11/07/12 0530  NA 134*  < > 137  --  138  137 139  K 4.6  < > 3.9  --  4.6 4.6 5.0  CL 97  < > 99  --  101 103 106  CO2 24  < > 25  --  28 27 26   GLUCOSE 232*  < > 127*  --  146* 145* 117*  BUN 11  < > 20  --  22 19 13   CREATININE 0.76  < > 0.89  --  0.94 0.65 0.66  CALCIUM 8.5  < > 9.4  --  9.9 9.9 9.8  MG 1.5  --  1.4* 2.4  --   --   --   < > = values in this interval not displayed. Liver Function Tests:  Recent Labs  08/20/12 1037 11/01/12 0500 11/02/12 0430  AST 18 81* 45*  ALT 19 130* 99*  ALKPHOS 83 166* 161*  BILITOT 0.1* 0.3 0.3  PROT 6.7 6.5 6.6  ALBUMIN 3.6 3.0* 3.1*   CBC:  Recent Labs  08/13/12 1026 08/20/12 1037 10/29/12 0904  11/02/12 0430 11/03/12 0400 11/06/12 0435  WBC 4.8 5.4 10.8*  < > 7.8 7.5 8.8  NEUTROABS 3.5 3.3 9.3*  --   --   --   --   HGB 11.7* 11.0* 9.5*  < > 9.3* 10.0* 9.7*  HCT 34.7* 32.5* 29.3*  < > 28.7* 30.3* 29.9*  MCV 100.3* 100.6* 98.7  < > 97.0 96.5 96.8  PLT 220 234 187  < > 252 263 351  < > = values in this interval not displayed. Cardiac Enzymes:  Recent Labs  10/29/12 0904  TROPONINI <0.30   11/11/12 wbc 7.6, hb 11.2, hct 35.1, plt 485, na 140, k 4.2, glu 133, cr 1.28, lft wnl, mg 1.5, digoxin 0.7  Assessment/Plan  Generalized Weakness- working with PT/OT and has made improvement. Continue therapy. Continue oral intake  Congestive dilated cardiomyopathy- continue lasix, digoxin, dobutamine gtt, spironlactone and small dose lisinopril. Monitor daily weight  CAD- remains chest pain free.  Continue asa, statin, ACEI  Anemia of chronic illness- stable h/h, monitor  Breast cancer- continue anastrozle for now  schizophrenia- continue nortriptyline for now with trazodone, also will continue loxapine and monitor clinically  Insomnia- has been sleeping well inf acility, continue trazodone and ramelteon for now   Family/ staff Communication: reviewed care plan with patient and nursing supervisor   Goals of care: to return home post completion of STR

## 2012-11-14 ENCOUNTER — Ambulatory Visit (HOSPITAL_BASED_OUTPATIENT_CLINIC_OR_DEPARTMENT_OTHER)
Admit: 2012-11-14 | Discharge: 2012-11-14 | Disposition: A | Discharge status: Home / Self Care | Payer: Medicare Other | Attending: Internal Medicine | Admitting: Internal Medicine

## 2012-11-14 ENCOUNTER — Non-Acute Institutional Stay (SKILLED_NURSING_FACILITY): Payer: Medicare Other | Admitting: Internal Medicine

## 2012-11-14 ENCOUNTER — Encounter (HOSPITAL_COMMUNITY): Payer: Self-pay

## 2012-11-14 VITALS — BP 98/56 | HR 61 | Resp 18 | Ht 65.0 in | Wt 138.2 lb

## 2012-11-14 DIAGNOSIS — I428 Other cardiomyopathies: Secondary | ICD-10-CM

## 2012-11-14 DIAGNOSIS — Z029 Encounter for administrative examinations, unspecified: Secondary | ICD-10-CM

## 2012-11-14 DIAGNOSIS — IMO0001 Reserved for inherently not codable concepts without codable children: Secondary | ICD-10-CM | POA: Insufficient documentation

## 2012-11-14 DIAGNOSIS — I5022 Chronic systolic (congestive) heart failure: Secondary | ICD-10-CM | POA: Insufficient documentation

## 2012-11-14 DIAGNOSIS — F209 Schizophrenia, unspecified: Secondary | ICD-10-CM

## 2012-11-14 DIAGNOSIS — R5383 Other fatigue: Secondary | ICD-10-CM

## 2012-11-14 DIAGNOSIS — F17200 Nicotine dependence, unspecified, uncomplicated: Secondary | ICD-10-CM | POA: Insufficient documentation

## 2012-11-14 DIAGNOSIS — I420 Dilated cardiomyopathy: Secondary | ICD-10-CM

## 2012-11-14 DIAGNOSIS — I2510 Atherosclerotic heart disease of native coronary artery without angina pectoris: Secondary | ICD-10-CM

## 2012-11-14 DIAGNOSIS — R531 Weakness: Secondary | ICD-10-CM

## 2012-11-14 NOTE — Progress Notes (Signed)
Patient ID: [name], female   DOB: [birth year], 62 y.o.   MRN: [id number]  Weight Range   Baseline proBNP     HPI: [name] is a 62 y/o female with a PMHx of COPD, CVA, HLD, DM2, schizophrenia and R. Breast CA (triple negative) who is currently being treated with radiation and s/p chemotherapy (Adriamycin).   Admitted to [facility] 7/16 [facility] through 11/07/12 with new acute systolic heart failure thought to be from adriamycin. 10/29/12 ECHO: Reviewed echo from [facility]. Showed EF 20% with global hypokinesis, moderate RV dysfunction, small pericardial effusion.  Had a cath as noted below milrinone was stopped and dobutamine begun given hypotension requiring norephinephrine.  Failed Dobutamine wean therefore she was discharged to [facility] on dobutamine. Difficulty titrating meds due to hypotension. Discharge weight 136 pounds.  As noted below, LHC showed 60-70% LM stenosis and 80% long RCA stenosis.  This coronary disease was not thought to be the culprit for her cardiomyopathy.   RHC/LHC 7/18 (on milrinone and norepinephrine) RA mean 18  RV 45/20  PA 41/21, mean 29  PCWP mean 21  LV 95/16  AO 109/67  Oxygen saturations:  PA 40%  AO 95%  Cardiac Output (Fick) 3.5  Cardiac Index (Fick) 2.05  Cardiac Output (Thermo) 2.9  Cardiac Index (Thermo) 1.7  SVR 1799  Coronary angiography:  Left mainstem: 60-70% distal left main stenosis.  Left anterior descending (LAD): Luminal irregularities in the LAD. 80% ostial stenosis small D1.  Left circumflex (LCx): Small vessel, no significant disease.  Right coronary artery (RCA): Long 80% mid RCA stenosis.  Left ventriculography: EF 25-30%, global hypokinesis.    She returns for post hospital follow up. Discharged from [facility] this. Able to walk in to the clinic today without dyspnea. + Orthopnea. HOB elelvated. Denies PND. Plan to go home with sisters today. [facility]HC to manage home Dobutamine. Weight at SNF 136 pounds.   ROS: All  systems negative except as listed in HPI, PMH and Problem List.  SH: Smoker prior to hospitalization.  Lives with sisters.    FH: No premature CAD  Labs (7/14): K 5, creatinine 0.66, co-ox 71%  Past Medical History  Diagnosis Date  . Allergic rhinitis   . Bipolar disorder   . GERD (gastroesophageal reflux disease)   . Hypokalemia   . Hyperlipemia   . COPD (chronic obstructive pulmonary disease)   . Heart palpitations   . Anemia   . Cancer   . 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  - Nonischemic cardiomyopathy on dobutamine - CAD  Current Outpatient Prescriptions  Medication Sig Dispense Refill  . acetaminophen (TYLENOL) 325 MG tablet Take 2 tablets (650 mg total) by mouth every 4 (four) hours as needed.      Marland Kitchen anastrozole (ARIMIDEX) 1 MG tablet Take 1 mg by mouth daily.      Marland Kitchen aspirin 81 MG chewable tablet Chew 1 tablet (81 mg total) by mouth daily.      Marland Kitchen atorvastatin (LIPITOR) 40 MG tablet Take 1 tablet (40 mg total) by mouth daily at 6 PM.      . digoxin (LANOXIN) 0.125 MG tablet Take 1 tablet (0.125 mg total) by mouth daily.      . DOBUTamine (DOBUTREX) 4-5 MG/ML-% infusion Inject 185.7 mcg/min into the vein continuous.  250 mL    . docusate sodium (COLACE) 100 MG capsule Take 100 mg by mouth at bedtime.      Marland Kitchen  fluPHENAZine (PROLIXIN) 2.5 MG tablet Take 2.5 mg by mouth at bedtime.      . furosemide (LASIX) 40 MG tablet Take 1 tablet (40 mg total) by mouth daily.  30 tablet    . heparin lock flush 100 UNIT/ML SOLN 5 mLs (500 Units total) by Intracatheter route every 30 (thirty) days.      . heparin lock flush 100 UNIT/ML SOLN 5 mLs (500 Units total) by Intracatheter route as needed (line care).      Marland Kitchen lisinopril (PRINIVIL,ZESTRIL) 2.5 MG tablet Take 0.5 tablets (1.25 mg total) by mouth daily.      Marland Kitchen loxapine (LOXITANE) 10 MG capsule Take 10 mg by mouth at bedtime.      . nortriptyline (PAMELOR) 50 MG capsule Take 100 mg by mouth at  bedtime.       . polyvinyl alcohol (LIQUIFILM TEARS) 1.4 % ophthalmic solution Place 1 drop into both eyes 2 (two) times daily as needed.  15 mL  0  . Propylene Glycol (SYSTANE BALANCE) 0.6 % SOLN Apply 1 drop to eye 2 (two) times daily as needed (dry eyes).      . ramelteon (ROZEREM) 8 MG tablet Take 8 mg by mouth at bedtime.      Marland Kitchen spironolactone (ALDACTONE) 12.5 mg TABS Take 0.5 tablets (12.5 mg total) by mouth daily.      . traZODone (DESYREL) 150 MG tablet Take 150 mg by mouth at bedtime.       No current facility-administered medications for this encounter.     PHYSICAL EXAM: Filed Vitals:   11/14/12 0905  BP: 98/56  Pulse: 61  Resp: 18  Height: 5\' 5"  (1.651 m)  Weight: 138 lb 4 oz (62.71 kg)  SpO2: 96%    General: NAD No resp difficulty  HEENT: normal  Neck: supple. JVP 5-6 cm Carotids 2+ bilat; no bruits. No lymphadenopathy or thryomegaly appreciated.  Cor: PMI nondisplaced. Mildly tachycardic, regular rate & regular rhythm. No rubs,  or murmurs. L porta cath. No S3/S4 Lungs: clear  Abdomen: soft, nontender, mild distention. No hepatosplenomegaly. No bruits or masses. Good bowel sounds.  Extremities: no cyanosis, clubbing, rash, edema  Neuro: alert & orientedx3, cranial nerves grossly intact. moves all 4 extremities w/o difficulty. Affect pleasant   ASSESSMENT & PLAN: 1. Chronic Systolic Heart Failure:  ECHO EF 20% with RV failure thought to be from Adriamycin toxicity.  Persistent hypotension during last hospitalization, required dobutamine and unable to titrate off.  SBP soft today in the 90s on dobutamine 3 mcg/kg/min.  She is not a good candidate for advanced therapies given schizophrenia and breast cancer.   - On chronic dobutaimine at 3 mcg/kg/min via porta cath.  I do not think that we can titrate this today given SBP in 90s.  Good co-ox when checked recently.  - No beta blocker due to low output and dobutamine use. - Repeat BMET today if potassium is <4.8, can  increase lisinopril to  1.25 mg bid.  - Volume status stable. Continue lasix 40 mg daily and spironolactone 12.5 mg daily.  - Check Pro BNP, BMET, CBC-Diff today by [facility] (follow diff for eosinophilia while on dobutamine). - Repeat echo in 3 months.  - Will see again in 3 wks.  Will reassess again for titrating down dobutamine.  2. CAD: See anatomy above.  Has significant CAD but do not think that it caused her cardiomyopathy.  If she recovers, will need to reassess in future.  Would likely consider IVUS  of LM lesion for significance.  No ischemic symptoms.  Would try to keep heart out of radiation field for right-sided breast cancer (risk for coronary disease progression with chest radiation).  Continue aspirin and statin.  3. Smoking: Active smoker.  Needs to quit.  4. Schizophrenia: per primary care/psych  Follow up in 3 weeks.   [name] 11/14/2012 10:20 AM

## 2012-11-14 NOTE — Patient Instructions (Addendum)
Follow up in 3 weeks  Do the following things EVERYDAY: 1) Weigh yourself in the morning before breakfast. Write it down and keep it in a log. 2) Take your medicines as prescribed 3) Eat low salt foods-Limit salt (sodium) to 2000 mg per day.  4) Stay as active as you can everyday 5) Limit all fluids for the day to less than 2 liters 

## 2012-11-14 NOTE — Progress Notes (Signed)
Patient ID: [name], female   DOB: May 15, 1950, 62 y.o.   MRN: [id number]  Chief Complaint  Patient presents with  . Discharge Note   Allergies  Allergen Reactions  . Ibuprofen Nausea Only  . Other Other (See Comments)    Ragweed Pepper - Sneezing   HPI 62 y/o female patient is here for STR post hospital admission with chf exacerbation. She has been working with therpay team ad has made improvement. She is being discharged home today with home health services She was seen in her room today. She has been walking around the building with no assistive device. Her energy level has improved. Appetite is fair  Review of Systems  Constitutional: Negative for fever, chills and weight loss.  HENT: Negative for congestion and sore throat.   Eyes: Negative for blurred vision.  Respiratory: Negative for cough, shortness of breath and wheezing.   Cardiovascular: Negative for chest pain and leg swelling.  Gastrointestinal: Negative for heartburn, nausea and vomiting.  Genitourinary: Negative for dysuria.  Musculoskeletal: Negative for myalgias.  Skin: Negative for rash.  Neurological: Negative for dizziness, loss of consciousness and headaches.  Psychiatric/Behavioral: Negative for depression and memory loss.   Vital signs stable, afebrile  General: No acute respiratory distress HEENT: no pallor, no icterus, no LAD, MMM, wears glasses Lungs: Clear to auscultation bilaterally without wheezes or crackles Cardiovascular: Regular rate and rhythm without murmur gallop or rub normal S1 and S2 Abdomen: Nontender, nondistended, soft, bowel sounds positive, no rebound, no ascites, no appreciable mass Extremities: No significant cyanosis, clubbing bilateral lower extremities, trace pedal edema Skin: has left chest portacath  ASSESSMENT/PLAN  Congestive dilated cardiomyopathy- continue lasix, digoxin, dobutamine gtt, spironlactone and lisinopril. Monitor daily weight and to follow with chf  clinic. Home health RN services arranged for managing dobutamine drip  CAD- remains chest pain free. Continue asa, statin, ACEI  Anemia of chronic illness- stable h/h, monitor  Breast cancer- continue anastrozle for now  schizophrenia- continue nortriptyline for now with trazodone, also will continue loxapine and monitor clinically  Insomnia- has been sleeping well inf acility, continue trazodone and ramelteon for now  Has improved in terms of her strength and has stable gait. Will not need further PT/OT services at present. Scripts for her medications provided

## 2012-11-21 ENCOUNTER — Encounter: Payer: Self-pay | Admitting: Internal Medicine

## 2012-11-24 ENCOUNTER — Telehealth (HOSPITAL_COMMUNITY): Payer: Self-pay | Admitting: Cardiology

## 2012-11-24 ENCOUNTER — Encounter (HOSPITAL_COMMUNITY): Payer: Self-pay | Admitting: Oncology

## 2012-11-24 ENCOUNTER — Encounter (HOSPITAL_COMMUNITY): Payer: Medicare Other | Attending: Hematology and Oncology | Admitting: Oncology

## 2012-11-24 VITALS — BP 117/74 | HR 61 | Temp 99.0°F | Resp 18 | Wt 135.8 lb

## 2012-11-24 DIAGNOSIS — C50319 Malignant neoplasm of lower-inner quadrant of unspecified female breast: Secondary | ICD-10-CM

## 2012-11-24 DIAGNOSIS — I5022 Chronic systolic (congestive) heart failure: Secondary | ICD-10-CM

## 2012-11-24 DIAGNOSIS — C50919 Malignant neoplasm of unspecified site of unspecified female breast: Secondary | ICD-10-CM | POA: Insufficient documentation

## 2012-11-24 DIAGNOSIS — Z171 Estrogen receptor negative status [ER-]: Secondary | ICD-10-CM

## 2012-11-24 DIAGNOSIS — C50911 Malignant neoplasm of unspecified site of right female breast: Secondary | ICD-10-CM

## 2012-11-24 NOTE — Patient Instructions (Addendum)
[facility] Discharge Instructions  RECOMMENDATIONS MADE BY THE CONSULTANT AND ANY TEST RESULTS WILL BE SENT TO YOUR REFERRING PHYSICIAN.  STOP the Arimidex. We will get you a referral with Dr.Palermo (Radiation Oncologist) sometime within the next month. We would like to see you back in 2 months.  Thank you for choosing [facility] to provide your oncology and hematology care.  To afford each patient quality time with our providers, please arrive at least 15 minutes before your scheduled appointment time.  With your help, our goal is to use those 15 minutes to complete the necessary work-up to ensure our physicians have the information they need to help with your evaluation and healthcare recommendations.    Effective January 1st, 2014, we ask that you re-schedule your appointment with our physicians should you arrive 10 or more minutes late for your appointment.  We strive to give you quality time with our providers, and arriving late affects you and other patients whose appointments are after yours.    Again, thank you for choosing [facility].  Our hope is that these requests will decrease the amount of time that you wait before being seen by our physicians.       _____________________________________________________________  Should you have questions after your visit to [facility], please contact our office at [phone] between the hours of 8:30 a.m. and 5:00 p.m.  Voicemails left after 4:30 p.m. will not be returned until the following business day.  For prescription refill requests, have your pharmacy contact our office with your prescription refill request.

## 2012-11-24 NOTE — Telephone Encounter (Signed)
Nurse called with FYI No blood return form PORt during bag change Do we want to change to a cath flow (type of PPA), unsure if insurance will cover

## 2012-11-24 NOTE — Progress Notes (Signed)
Kristine Maudlin, MD [address] [place] [address]  Breast cancer, right breast  CURRENT THERAPY: Arimidex 1 mg every day, beginning on 10/27/2012  INTERVAL HISTORY: [name] 62 y.o. 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 Skin involvement extended down to and below the inframammary fold medially and inferiorly and extended around the entire nipple areola complex. She was essentially triple negative but did have PR receptor 1% positivity. She was ER negative and HER-2/neu nonamplified. She is now status post 4 cycles of Adriamycin and Cytoxan (04/03/2012- 05/15/2012) followed by weekly Taxol x12 doses (05/28/2012- 08/20/2012). She had some thickening still in the inframammary fold which is very minimal and some discoloration but no distinct mass. Dr. Malvin Johns and Dr. Mariel Sleet were still concerned about tumor at the inframammary fold. Dr. Malvin Johns does not feel that she is a good surgical candidate for a mastectomy because of its location and therefore she was sent to Dr. Thersa Salt (Rad Onc) for radiation.  She received about 17 treatments of radiation with last treatment on 10/29/2012.    She then presented to ED on 10/29/2012 with increased shortness of breath.  She was subsequently admitted to hospital with severe acute systolic heart failure with EF of 20% with RV.  It was suspected to be adriamycin-induced, but this would be a rare circumstance as one would expect adriamycin-induced heart failure to occur during therapy or many years after administration with increased risk of congestive heart failure, not typically 6 months out from treatment.  Her follow-up ECHO performed after completion of Adriamycin was stable with EF of 55%.  She did not receive Herceptin.  She was started on Arimidex on  10/27/2012 for her PR 1% positivity.  Arimidex contains a 4% risk of ischemic heart disease with a 17% risk in those with pre-existing ischemic heart disease.  It also carries a 1% risk of myocardial infarction.   With this information and her trace PR positivity at 1%, I will D/C Arimidex at this time.  This anti-endocrine therapy can be added in the future if deemed necessary, but due to her recent cardiac changes.  She denies any complaints.  She remains of a Dobutamine pump and this is managed by her cardiologist.  She is due to see him again in the future.   I have discussed her case with Dr. Thersa Salt, Radiation oncologist, and he does not feel that she is a candidate for further radiation therapy.  Despite this, I will get her back to see him in follow-up since her cancer is right sided in the event that future radiation may be an option.    I have also discussed her case with Dr. Welton Flakes (Oncologist) and she agrees with me that Arimidex should be discontinued in light of her barely PR positivity.    Her chart is reviewed along with lab results and radiographic studies.  Notes are reviewed as well.   Oncologically, she denies any complaints and ROS questioning is negative.     Past Medical History  Diagnosis Date  . Allergic rhinitis   . Bipolar disorder   . GERD (gastroesophageal reflux disease)   . Hypokalemia   . Hyperlipemia   . COPD (chronic obstructive pulmonary disease)   . Heart palpitations   . Anemia   .  Cancer   . 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    has GERD (gastroesophageal reflux disease); Epigastric pain; Schizophrenia; Fatty liver; Hyperlipidemia; Breast cancer, right breast; Congestive dilated cardiomyopathy; Generalized weakness; CAD (coronary artery disease); Insomnia; Chronic systolic CHF (congestive heart failure); and Smoking on her problem list.     is allergic to ibuprofen and other.  [name] had no medications  administered during this visit.  Past Surgical History  Procedure Laterality Date  . Sterilization    . Colonoscopy    . Upper gastrointestinal endoscopy    . Dilation and curettage of uterus    . Colonoscopy with esophagogastroduodenoscopy (egd)  02/21/2012    Procedure: COLONOSCOPY WITH ESOPHAGOGASTRODUODENOSCOPY (EGD);  Surgeon: Malissa Hippo, MD;  Location: [facility];  Service: Endoscopy;  Laterality: N/A;  2:25  . Right breast lumpectomy      benign/Dr. Lovell Sheehan ?1990's  . Breast biopsy  2013    right breast  . Portacath placement  04/01/2012    Procedure: INSERTION PORT-A-CATH;  Surgeon: Marlane Hatcher, MD;  Location: [facility];  Service: General;  Laterality: Left;  Insertion Port-A-Cath Left Subclavian under Fluoroscopy    Denies any headaches, dizziness, double vision, fevers, chills, night sweats, nausea, vomiting, diarrhea, constipation, chest pain, heart palpitations, shortness of breath, blood in stool, black tarry stool, urinary pain, urinary burning, urinary frequency, hematuria.   PHYSICAL EXAMINATION  ECOG PERFORMANCE STATUS: 1 - Symptomatic but completely ambulatory  Filed Vitals:   11/24/12 1400  BP: 117/74  Pulse: 61  Temp: 99 F (37.2 C)  Resp: 18    GENERAL:alert, no distress, well nourished, well developed, comfortable, cooperative and smiling SKIN: skin color, texture, turgor are normal, no rashes or significant lesions HEAD: Normocephalic, No masses, lesions, tenderness or abnormalities EYES: normal, PERRLA, EOMI, Conjunctiva are pink and non-injected EARS: External ears normal OROPHARYNX:mucous membranes are moist  NECK: supple, no adenopathy, no stridor, non-tender, trachea midline LYMPH:  no palpable lymphadenopathy, no hepatosplenomegaly BREAST:left breast normal without mass, skin or nipple changes or axillary nodes, Right breast with induration in the inferior aspect of the breast but no definite mass palpable, there is also some  hyperpigmentation of the skin as well. LUNGS: clear to auscultation and percussion HEART: regular rate & rhythm, no murmurs, no gallops, S1 normal and S2 normal ABDOMEN:abdomen soft, non-tender, obese, normal bowel sounds and no masses or organomegaly BACK: Back symmetric, no curvature. EXTREMITIES:less then 2 second capillary refill, no joint deformities, effusion, or inflammation, no edema, no skin discoloration, no clubbing, no cyanosis  NEURO: alert & oriented x 3 with fluent speech, no focal motor/sensory deficits, gait normal    LABORATORY DATA: CBC    Component Value Date/Time   WBC 8.8 11/06/2012 0435   RBC 3.09* 11/06/2012 0435   HGB 9.7* 11/06/2012 0435   HCT 29.9* 11/06/2012 0435   PLT 351 11/06/2012 0435   MCV 96.8 11/06/2012 0435   MCH 31.4 11/06/2012 0435   MCHC 32.4 11/06/2012 0435   RDW 14.8 11/06/2012 0435   LYMPHSABS 0.4* 10/29/2012 0904   MONOABS 1.1* 10/29/2012 0904   EOSABS 0.0 10/29/2012 0904   BASOSABS 0.0 10/29/2012 0904      Chemistry      Component Value Date/Time   NA 139 11/07/2012 0530   K 5.0 11/07/2012 0530   CL 106 11/07/2012 0530   CO2 26 11/07/2012 0530   BUN 13 11/07/2012 0530   CREATININE 0.66 11/07/2012  0530      Component Value Date/Time   CALCIUM 9.8 11/07/2012 0530   ALKPHOS 161* 11/02/2012 0430   AST 45* 11/02/2012 0430   ALT 99* 11/02/2012 0430   BILITOT 0.3 11/02/2012 0430          ASSESSMENT:  1. Inflammatory carcinoma the right breast presenting with an 8 x 6 cm mass in the lower inner quadrant but with approximately 40% of her breast involved by erythema. Skin involvement extended down to and below the inframammary fold medially and inferiorly and extended around the entire nipple areola complex. She was essentially triple negative but did have PR receptor 1% positivity. She was ER negative and HER-2/neu nonamplified. She is now status post 4 cycles of Adriamycin and Cytoxan (04/03/2012- 05/15/2012) followed by weekly Taxol x12 doses  (05/28/2012- 08/20/2012). She had some thickening still in the inframammary fold which is very minimal and some discoloration but no distinct mass. Dr. Malvin Johns and Dr. Mariel Sleet were still concerned about tumor at the inframammary fold. Dr. Malvin Johns does not feel that she is a good surgical candidate for a mastectomy because of its location and therefore she was sent to Dr. Thersa Salt (Rad Onc) for radiation.  She received about 17 treatments of radiation with last treatment on 10/29/2012.   2. Acute systolic heart failure with EF of 20% and RV dysfunction.  Requiring outpatient continuous dobutamine infusion. 3. Multiple coronary stenoses, not a candidate for stent placement.  Patient Active Problem List   Diagnosis Date Noted  . Chronic systolic CHF (congestive heart failure) 11/14/2012  . Smoking 11/14/2012  . Generalized weakness 11/13/2012  . CAD (coronary artery disease) 11/13/2012  . Insomnia 11/13/2012  . Congestive dilated cardiomyopathy 10/29/2012  . Breast cancer, right breast 03/19/2012  . GERD (gastroesophageal reflux disease) 02/05/2012  . Epigastric pain 02/05/2012  . Schizophrenia 02/05/2012  . Fatty liver 02/05/2012  . Hyperlipidemia 02/05/2012      PLAN:  1. I personally reviewed and went over laboratory results with the patient. 2. I personally reviewed and went over radiographic studies with the patient. 3. Discussed the case with Dr. Thersa Salt.  Due to recent changes in patient's case, he does not feel that she is a radiation therapy candidate.  4. D/C Arimidex at this time due to its small increased risk of ischemic heart disease. 5. Discussed case with Dr. Welton Flakes (Hem/Onc).  She agrees with #4. 6. Will refer the patient to radiation oncology for decision regarding future radiation. 7. Follow-up with cardiology as directed.  8. Return in 2 months for follow-up   THERAPY PLAN:  We will D/C treatment from an oncology standpoint while she recovers from a cardiac standpoint.   We can re-introduce Arimidex in the future, however, with a PR 1% +, I question the necessity of this medication with her recent revelations.   All questions were answered. The patient knows to call the clinic with any problems, questions or concerns. We can certainly see the patient much sooner if necessary.  Patient and plan discussed with Dr. Benita Gutter and he is in agreement with the aforementioned.   More than 50% of the time spent with the patient was utilized for counseling and coordination of care.  Face-to-face time with patient is 40 minutes.  A total of 70 minutes spent on office visit.  Athanasia Stanwood

## 2012-11-24 NOTE — Telephone Encounter (Signed)
PJ states pt's Port will not give blood return, she states the site looks good and is infusing Dobutamine fine but will not give blood return, she wants to make sure Dr Gala Romney is ok with this or if he would like pt to have TPA, she doesn't think insurance will cover and pt may have to pay out of pocket, will let Dr Gala Romney know and call her back tomorrow

## 2012-11-24 NOTE — Telephone Encounter (Signed)
If port not drawing back would use T-PA so it doesn't clot off.

## 2012-11-25 NOTE — Telephone Encounter (Signed)
Spoke w/PJ she is aware and will call the [facility] to see if insurance will cover, will let me know if we need to do anything further

## 2012-11-26 MED ORDER — SPIRONOLACTONE 25 MG PO TABS: 25.0000 mg | ORAL_TABLET | Freq: Every day | ORAL | Status: DC

## 2012-11-26 NOTE — Addendum Note (Signed)
Addended by: Noralee Space on: 11/26/2012 04:25 PM   Modules accepted: Orders

## 2012-11-26 NOTE — Telephone Encounter (Signed)
Insurance will not cover TPA to be given in the home, it will cover to be given in the hospital, have spoken w/[facility] they will see pt tomorrow 8/14 at 8am to give TPA, pt's sister Roseanne Kaufman is aware and order faxed to [facility] at [phone].  Also reviewed pt's labs from 8/8 K 3.0 per Ulla Potash, NP increase Cleda Daub to 25 mg daily recheck bmet next week, sister verbalizes understanding, also called and spoke w/PJ she is aware of med change and pt is scheduled for weekly labs on every Friday

## 2012-11-27 ENCOUNTER — Ambulatory Visit (HOSPITAL_COMMUNITY)
Admission: RE | Admit: 2012-11-27 | Discharge: 2012-11-27 | Disposition: A | Discharge status: Home / Self Care | Payer: Medicare Other | Source: Ambulatory Visit | Attending: Internal Medicine | Admitting: Internal Medicine

## 2012-11-27 DIAGNOSIS — Y849 Medical procedure, unspecified as the cause of abnormal reaction of the patient, or of later complication, without mention of misadventure at the time of the procedure: Secondary | ICD-10-CM | POA: Insufficient documentation

## 2012-11-27 DIAGNOSIS — T82898A Other specified complication of vascular prosthetic devices, implants and grafts, initial encounter: Secondary | ICD-10-CM | POA: Insufficient documentation

## 2012-11-27 MED ORDER — SODIUM CHLORIDE 0.9 % IJ SOLN
10.0000 mL | INTRAMUSCULAR | Status: DC | PRN
  Administered 2012-11-27: 10 mL

## 2012-11-27 MED ORDER — SODIUM CHLORIDE 0.9 % IJ SOLN: 10.0000 mL | INTRAMUSCULAR | Status: DC | PRN

## 2012-11-27 MED ORDER — ALTEPLASE 100 MG IV SOLR
2.0000 mg | Freq: Once | INTRAVENOUS | Status: AC
  Administered 2012-11-27: 2 mg
  Filled 2012-11-27: qty 2

## 2012-11-27 MED ORDER — ALTEPLASE 2 MG IJ SOLR
INTRAMUSCULAR | Status: AC
  Filled 2012-11-27: qty 2

## 2012-11-27 MED ORDER — ALTEPLASE 2 MG IJ SOLR
2.0000 mg | Freq: Once | INTRAMUSCULAR | Status: AC
  Administered 2012-11-27: 2 mg

## 2012-11-27 MED ORDER — SODIUM CHLORIDE 0.9 % IJ SOLN
10.0000 mL | INTRAMUSCULAR | Status: AC | PRN
  Administered 2012-11-27: 10 mL

## 2012-11-27 NOTE — Progress Notes (Signed)
10 Spoke with Joni Reining, RN @ [facility] about the need for [place] to stop the continuous infusion of dobutamine long enough for the instillation of tpa and the time it would take for the medication to stay in the port in order to declot the access. Dr. Teressa Lower relayed that it would be okay. Family present and aware of need for intervention.

## 2012-11-27 NOTE — Progress Notes (Signed)
Blood return noted when drawing back after second dose of tpa had been instilled for 2 hours. 10cc of waste pulled back and discarded, flushed, and dobutamine drip started back at original rate.

## 2012-11-28 ENCOUNTER — Encounter: Payer: Self-pay | Admitting: Internal Medicine

## 2012-12-01 ENCOUNTER — Encounter: Payer: Self-pay | Admitting: Internal Medicine

## 2012-12-04 ENCOUNTER — Encounter (HOSPITAL_COMMUNITY): Payer: Medicare Other

## 2012-12-05 ENCOUNTER — Encounter: Payer: Self-pay | Admitting: Internal Medicine

## 2012-12-05 ENCOUNTER — Telehealth (HOSPITAL_COMMUNITY): Payer: Self-pay | Admitting: Cardiology

## 2012-12-05 DIAGNOSIS — R7309 Other abnormal glucose: Secondary | ICD-10-CM

## 2012-12-05 NOTE — Telephone Encounter (Signed)
[name] called with a critical level for pt. pts glucose was 539. Advised to contact PCP as we manage her HF Per Jennifer-her PCP office closes at 12 noon on Friday and advises pt to go straight to ER Spoke with Ulla Potash, NP to see if she felt comfortable addressing blood sugar. Per VO Ulla Potash, Np pt should seek care in the ER as her HF is stable at this time [name] aware

## 2012-12-08 ENCOUNTER — Encounter (HOSPITAL_COMMUNITY): Payer: Self-pay | Admitting: Oncology

## 2012-12-11 ENCOUNTER — Ambulatory Visit (HOSPITAL_COMMUNITY)
Admission: RE | Admit: 2012-12-11 | Discharge: 2012-12-11 | Disposition: A | Discharge status: Home / Self Care | Payer: Medicare Other | Source: Ambulatory Visit | Attending: Internal Medicine | Admitting: Internal Medicine

## 2012-12-11 VITALS — BP 106/72 | HR 120 | Wt 131.1 lb

## 2012-12-11 DIAGNOSIS — I428 Other cardiomyopathies: Secondary | ICD-10-CM | POA: Insufficient documentation

## 2012-12-11 DIAGNOSIS — F209 Schizophrenia, unspecified: Secondary | ICD-10-CM | POA: Insufficient documentation

## 2012-12-11 DIAGNOSIS — I2510 Atherosclerotic heart disease of native coronary artery without angina pectoris: Secondary | ICD-10-CM

## 2012-12-11 DIAGNOSIS — I509 Heart failure, unspecified: Secondary | ICD-10-CM

## 2012-12-11 DIAGNOSIS — IMO0001 Reserved for inherently not codable concepts without codable children: Secondary | ICD-10-CM | POA: Insufficient documentation

## 2012-12-11 DIAGNOSIS — E785 Hyperlipidemia, unspecified: Secondary | ICD-10-CM | POA: Insufficient documentation

## 2012-12-11 DIAGNOSIS — F17200 Nicotine dependence, unspecified, uncomplicated: Secondary | ICD-10-CM

## 2012-12-11 DIAGNOSIS — Z79899 Other long term (current) drug therapy: Secondary | ICD-10-CM | POA: Insufficient documentation

## 2012-12-11 DIAGNOSIS — J449 Chronic obstructive pulmonary disease, unspecified: Secondary | ICD-10-CM | POA: Insufficient documentation

## 2012-12-11 DIAGNOSIS — J4489 Other specified chronic obstructive pulmonary disease: Secondary | ICD-10-CM | POA: Insufficient documentation

## 2012-12-11 DIAGNOSIS — E119 Type 2 diabetes mellitus without complications: Secondary | ICD-10-CM | POA: Insufficient documentation

## 2012-12-11 DIAGNOSIS — K219 Gastro-esophageal reflux disease without esophagitis: Secondary | ICD-10-CM | POA: Insufficient documentation

## 2012-12-11 DIAGNOSIS — F319 Bipolar disorder, unspecified: Secondary | ICD-10-CM | POA: Insufficient documentation

## 2012-12-11 DIAGNOSIS — Z8673 Personal history of transient ischemic attack (TIA), and cerebral infarction without residual deficits: Secondary | ICD-10-CM | POA: Insufficient documentation

## 2012-12-11 DIAGNOSIS — I5022 Chronic systolic (congestive) heart failure: Secondary | ICD-10-CM | POA: Insufficient documentation

## 2012-12-11 DIAGNOSIS — Z7982 Long term (current) use of aspirin: Secondary | ICD-10-CM | POA: Insufficient documentation

## 2012-12-11 DIAGNOSIS — C50919 Malignant neoplasm of unspecified site of unspecified female breast: Secondary | ICD-10-CM | POA: Insufficient documentation

## 2012-12-11 MED ORDER — GLIPIZIDE 5 MG PO TABS: 10.0000 mg | ORAL_TABLET | Freq: Two times a day (BID) | ORAL | Status: DC

## 2012-12-11 NOTE — Patient Instructions (Addendum)
Follow up next week  Take glipizide 10 mg twice a day  Do the following things EVERYDAY: 1) Weigh yourself in the morning before breakfast. Write it down and keep it in a log. 2) Take your medicines as prescribed 3) Eat low salt foods-Limit salt (sodium) to 2000 mg per day.  4) Stay as active as you can everyday 5) Limit all fluids for the day to less than 2 liters

## 2012-12-11 NOTE — Progress Notes (Signed)
Patient ID: [name], female   DOB: 02/27/1951, 62 y.o.   MRN: [id number] P Weight Range   Baseline proBNP     HPI: [name] is a 63 y/o female with a PMHx of COPD, CVA, HLD, DM2, schizophrenia and R. Breast CA (triple negative) who is currently being treated with radiation and s/p chemotherapy (Adriamycin).   Admitted to [facility] 7/16 MC through 11/07/12 with new acute systolic heart failure thought to be from adriamycin. 10/29/12 ECHO: Reviewed echo from [facility]. Showed EF 20% with global hypokinesis, moderate RV dysfunction, small pericardial effusion.  Had a cath as noted below milrinone was stopped and dobutamine begun given hypotension requiring norephinephrine.  Failed Dobutamine wean therefore she was discharged to [facility] on dobutamine. Difficulty titrating meds due to hypotension. Discharge weight 136 pounds.  As noted below, LHC showed 60-70% LM stenosis and 80% long RCA stenosis.  This coronary disease was not thought to be the culprit for her cardiomyopathy.   RHC/LHC 7/18 (on milrinone and norepinephrine) RA mean 18  RV 45/20  PA 41/21, mean 29  PCWP mean 21  LV 95/16  AO 109/67  Oxygen saturations:  PA 40%  AO 95%  Cardiac Output (Fick) 3.5  Cardiac Index (Fick) 2.05  Cardiac Output (Thermo) 2.9  Cardiac Index (Thermo) 1.7  SVR 1799  Coronary angiography:  Left mainstem: 60-70% distal left main stenosis.  Left anterior descending (LAD): Luminal irregularities in the LAD. 80% ostial stenosis small D1.  Left circumflex (LCx): Small vessel, no significant disease.  Right coronary artery (RCA): Long 80% mid RCA stenosis.  Left ventriculography: EF 25-30%, global hypokinesis.   She returns for follow up. Since her last visit she was admitted to [facility] for uncontrolled DM. Denies SOB/PND/Orthopnea/dizziness. Family reports dyspnea with exertion ("looks like she's breathing heavily") though patient denies. Weight at home trending down from 137 to  130 pounds. AHC to manage home dobutamine. Medications managed by her sister.   ROS: All systems negative except as listed in HPI, PMH and Problem List.  SH: Smoker prior to hospitalization.  Lives with sisters.    FH: No premature CAD  Labs (7/14): K 5, creatinine 0.66, co-ox 71% Labs (8/14): K 4.1, creatinine 1.17  Past Medical History  Diagnosis Date  . Allergic rhinitis   . Bipolar disorder   . GERD (gastroesophageal reflux disease)   . Hypokalemia   . Hyperlipemia   . COPD (chronic obstructive pulmonary disease)   . Heart palpitations   . Anemia   . Cancer   . 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  - Nonischemic cardiomyopathy on dobutamine - CAD  Current Outpatient Prescriptions  Medication Sig Dispense Refill  . acetaminophen (TYLENOL) 325 MG tablet Take 2 tablets (650 mg total) by mouth every 4 (four) hours as needed.      Marland Kitchen aspirin 81 MG chewable tablet Chew 1 tablet (81 mg total) by mouth daily.      . digoxin (LANOXIN) 0.125 MG tablet Take 1 tablet (0.125 mg total) by mouth daily.      . DOBUTamine (DOBUTREX) 4-5 MG/ML-% infusion Inject 185.7 mcg/min into the vein continuous.  250 mL    . docusate sodium (COLACE) 100 MG capsule Take 100 mg by mouth at bedtime.      . fluPHENAZine (PROLIXIN) 2.5 MG tablet Take 2.5 mg by mouth at bedtime.      . furosemide (LASIX) 40 MG tablet Take  1 tablet (40 mg total) by mouth daily.  30 tablet    . glipiZIDE (GLUCOTROL) 5 MG tablet Take 5 mg by mouth 2 (two) times daily before a meal.      . heparin lock flush 100 UNIT/ML SOLN 5 mLs (500 Units total) by Intracatheter route every 30 (thirty) days.      Marland Kitchen lisinopril (PRINIVIL,ZESTRIL) 2.5 MG tablet Take 0.5 tablets (1.25 mg total) by mouth daily.      Marland Kitchen loxapine (LOXITANE) 10 MG capsule Take 10 mg by mouth at bedtime.      . nortriptyline (PAMELOR) 50 MG capsule Take 100 mg by mouth at bedtime.       . polyvinyl alcohol (LIQUIFILM TEARS) 1.4 %  ophthalmic solution Place 1 drop into both eyes 2 (two) times daily as needed.  15 mL  0  . Propylene Glycol (SYSTANE BALANCE) 0.6 % SOLN Apply 1 drop to eye 2 (two) times daily as needed (dry eyes).      . ramelteon (ROZEREM) 8 MG tablet Take 8 mg by mouth at bedtime.      . simvastatin (ZOCOR) 40 MG tablet Take 40 mg by mouth every evening.      Marland Kitchen spironolactone (ALDACTONE) 25 MG tablet Take 1 tablet (25 mg total) by mouth daily.      . traZODone (DESYREL) 150 MG tablet Take 150 mg by mouth at bedtime.       No current facility-administered medications for this encounter.     PHYSICAL EXAM: Filed Vitals:   12/11/12 1123  BP: 106/72  Pulse: 120  Weight: 131 lb 1.9 oz (59.476 kg)  SpO2: 97%    General: NAD No resp difficulty 3 sisters present  HEENT: normal  Neck: supple. JVP 5-6 cm Carotids 2+ bilat; no bruits. No lymphadenopathy or thryomegaly appreciated.  Cor: PMI nondisplaced. Mildly tachycardic, regular rate & regular rhythm. No rubs,  or murmurs. L porta cath. No S3/S4 Lungs: clear  Abdomen: soft, nontender, mild distention. No hepatosplenomegaly. No bruits or masses. Good bowel sounds.  Extremities: no cyanosis, clubbing, rash, edema  Neuro: alert & orientedx3, cranial nerves grossly intact. moves all 4 extremities w/o difficulty. Affect pleasant   ASSESSMENT & PLAN: 1. Chronic Systolic Heart Failure:  ECHO EF 20% with RV failure thought to be from Adriamycin toxicity.  Chronic Adriamycin toxicity most often presents in the first year after medication use, with risk probably peaking at around 3 months.  On home dobutamine at 3 mcg via porta cath. She is not a good candidate for advanced therapies given schizophrenia and breast cancer.  BP is higher today than at last appointment.  - On chronic dobutamine at 3 mcg/kg/min via porta cath.  Cut back dobutamine to 2 mcg/kg/min. Follow up next week and anticipate cutting back Dobutamine to 1 mcg if she tolerates wean.  - No beta  blocker due to low output and dobutamine use. - Continue lisinopril to  1.25 mg daily, will try to increase to 2.5 mg daily at next appointment.   - Volume status stable, does not appear overloaded. Continue lasix 40 mg daily and spironolactone 25 mg daily.  - Continue weekly BMET and CBC/diff while on Dobutamine.  - Continue [facility] for home Dobutaimine - Repeat echo in 3 months post-hospital discharge.   2. CAD: See anatomy above.  Has significant CAD but do not think that it caused her cardiomyopathy.  If she recovers, may need to reassess in future.  Would consider IVUS of LM  lesion for significance.  No ischemic symptoms.  Continue aspirin and statin. Check lipids next week by [facility].   3. Smoking: Active smoker.  Needs to quit.   4. Schizophrenia: per primary care/psych  5. R Breast Cancer:  Followed by Dr Jacalyn Lefevre. No further plans for radiation.    6. Uncontrolled DM: Increase glipizide to 10 mg twice a day. Instructed to follow up with Dr Juanetta Gosling to assist with glucose control. I asked her daughters to get in touch with him today.  I suspect that she may need insulin.  Her blood glucose runs in the 300s-400s.   [name] 12/11/2012 2:01 PM

## 2012-12-13 ENCOUNTER — Encounter (HOSPITAL_COMMUNITY): Payer: Self-pay | Admitting: *Deleted

## 2012-12-13 ENCOUNTER — Emergency Department (HOSPITAL_COMMUNITY)
Admission: EM | Admit: 2012-12-13 | Discharge: 2012-12-13 | Disposition: A | Discharge status: Home / Self Care | Payer: Medicare Other | Attending: Emergency Medicine | Admitting: Emergency Medicine

## 2012-12-13 DIAGNOSIS — J4489 Other specified chronic obstructive pulmonary disease: Secondary | ICD-10-CM | POA: Insufficient documentation

## 2012-12-13 DIAGNOSIS — K219 Gastro-esophageal reflux disease without esophagitis: Secondary | ICD-10-CM | POA: Insufficient documentation

## 2012-12-13 DIAGNOSIS — R000 Tachycardia, unspecified: Secondary | ICD-10-CM | POA: Insufficient documentation

## 2012-12-13 DIAGNOSIS — I2510 Atherosclerotic heart disease of native coronary artery without angina pectoris: Secondary | ICD-10-CM | POA: Insufficient documentation

## 2012-12-13 DIAGNOSIS — E785 Hyperlipidemia, unspecified: Secondary | ICD-10-CM | POA: Insufficient documentation

## 2012-12-13 DIAGNOSIS — Z862 Personal history of diseases of the blood and blood-forming organs and certain disorders involving the immune mechanism: Secondary | ICD-10-CM | POA: Insufficient documentation

## 2012-12-13 DIAGNOSIS — Z79899 Other long term (current) drug therapy: Secondary | ICD-10-CM | POA: Insufficient documentation

## 2012-12-13 DIAGNOSIS — Z7982 Long term (current) use of aspirin: Secondary | ICD-10-CM | POA: Insufficient documentation

## 2012-12-13 DIAGNOSIS — Z8679 Personal history of other diseases of the circulatory system: Secondary | ICD-10-CM | POA: Insufficient documentation

## 2012-12-13 DIAGNOSIS — Z8709 Personal history of other diseases of the respiratory system: Secondary | ICD-10-CM | POA: Insufficient documentation

## 2012-12-13 DIAGNOSIS — Z8673 Personal history of transient ischemic attack (TIA), and cerebral infarction without residual deficits: Secondary | ICD-10-CM | POA: Insufficient documentation

## 2012-12-13 DIAGNOSIS — Z853 Personal history of malignant neoplasm of breast: Secondary | ICD-10-CM | POA: Insufficient documentation

## 2012-12-13 DIAGNOSIS — Z8639 Personal history of other endocrine, nutritional and metabolic disease: Secondary | ICD-10-CM | POA: Insufficient documentation

## 2012-12-13 DIAGNOSIS — E119 Type 2 diabetes mellitus without complications: Secondary | ICD-10-CM | POA: Insufficient documentation

## 2012-12-13 DIAGNOSIS — Z8659 Personal history of other mental and behavioral disorders: Secondary | ICD-10-CM | POA: Insufficient documentation

## 2012-12-13 DIAGNOSIS — F17200 Nicotine dependence, unspecified, uncomplicated: Secondary | ICD-10-CM | POA: Insufficient documentation

## 2012-12-13 DIAGNOSIS — J449 Chronic obstructive pulmonary disease, unspecified: Secondary | ICD-10-CM | POA: Insufficient documentation

## 2012-12-13 HISTORY — DX: Atherosclerotic heart disease of native coronary artery without angina pectoris: I25.10

## 2012-12-13 HISTORY — DX: Type 2 diabetes mellitus without complications: E11.9

## 2012-12-13 MED ORDER — METFORMIN HCL 500 MG PO TABS: 500.0000 mg | ORAL_TABLET | Freq: Two times a day (BID) | ORAL | Status: DC

## 2012-12-13 MED ORDER — METFORMIN HCL 500 MG PO TABS
500.0000 mg | ORAL_TABLET | Freq: Once | ORAL | Status: AC
  Administered 2012-12-13: 500 mg via ORAL
  Filled 2012-12-13: qty 1

## 2012-12-13 NOTE — Discharge Instructions (Signed)
Blood Sugar Monitoring, Adult GLUCOSE METERS FOR SELF-MONITORING OF BLOOD GLUCOSE  It is important to be able to correctly measure your blood sugar (glucose). You can use a blood glucose monitor (a small battery-operated device) to check your glucose level at any time. This allows you and your caregiver to monitor your diabetes and to determine how well your treatment plan is working. The process of monitoring your blood glucose with a glucose meter is called self-monitoring of blood glucose (SMBG). When people with diabetes control their blood sugar, they have better health. To test for glucose with a typical glucose meter, place the disposable strip in the meter. Then place a small sample of blood on the "test strip." The test strip is coated with chemicals that combine with glucose in blood. The meter measures how much glucose is present. The meter displays the glucose level as a number. Several new models can record and store a number of test results. Some models can connect to personal computers to store test results or print them out.  Newer meters are often easier to use than older models. Some meters allow you to get blood from places other than your fingertip. Some new models have automatic timing, error codes, signals, or barcode readers to help with proper adjustment (calibration). Some meters have a large display screen or spoken instructions for people with visual impairments.  INSTRUCTIONS FOR USING GLUCOSE METERS  Wash your hands with soap and warm water, or clean the area with alcohol. Dry your hands completely.  Prick the side of your fingertip with a lancet (a sharp-pointed tool used by hand).  Hold the hand down and gently milk the finger until a small drop of blood appears. Catch the blood with the test strip.  Follow the instructions for inserting the test strip and using the SMBG meter. Most meters require the meter to be turned on and the test strip to be inserted before applying  the blood sample.  Record the test result.  Read the instructions carefully for both the meter and the test strips that go with it. Meter instructions are found in the user manual. Keep this manual to help you solve any problems that may arise. Many meters use "error codes" when there is a problem with the meter, the test strip, or the blood sample on the strip. You will need the manual to understand these error codes and fix the problem.  New devices are available such as laser lancets and meters that can test blood taken from "alternative sites" of the body, other than fingertips. However, you should use standard fingertip testing if your glucose changes rapidly. Also, use standard testing if:  You have eaten, exercised, or taken insulin in the past 2 hours.  You think your glucose is low.  You tend to not feel symptoms of low blood glucose (hypoglycemia).  You are ill or under stress.  Clean the meter as directed by the manufacturer.  Test the meter for accuracy as directed by the manufacturer.  Take your meter with you to your caregiver's office. This way, you can test your glucose in front of your caregiver to make sure you are using the meter correctly. Your caregiver can also take a sample of blood to test using a routine lab method. If values on the glucose meter are close to the lab results, you and your caregiver will see that your meter is working well and you are using good technique. Your caregiver will advise you about what  to do if the results do not match. FREQUENCY OF TESTING  Your caregiver will tell you how often you should check your blood glucose. This will depend on your type of diabetes, your current level of diabetes control, and your types of medicines. The following are general guidelines, but your care plan may be different. Record all your readings and the time of day you took them for review with your caregiver.   Diabetes type 1.  When you are using insulin  with good diabetic control (either multiple daily injections or via a pump), you should check your glucose 4 times a day.  If your diabetes is not well controlled, you may need to monitor more frequently, including before meals and 2 hours after meals, at bedtime, and occasionally between 2 a.m. and 3 a.m.  You should always check your glucose before a dose of insulin or before changing the rate on your insulin pump.  Diabetes type 2.  Guidelines for SMBG in diabetes type 2 are not as well defined.  If you are on insulin, follow the guidelines above.  If you are on medicines, but not insulin, and your glucose is not well controlled, you should test at least twice daily.  If you are not on insulin, and your diabetes is controlled with medicines or diet alone, you should test at least once daily, usually before breakfast.  A weekly profile will help your caregiver advise you on your care plan. The week before your visit, check your glucose before a meal and 2 hours after a meal at least daily. You may want to test before and after a different meal each day so you and your caregiver can tell how well controlled your blood sugars are throughout the course of a 24 hour period.  Gestational diabetes (diabetes during pregnancy).  Frequent testing is often necessary. Accurate timing is important.  If you are not on insulin, check your glucose 4 times a day. Check it before breakfast and 1 hour after the start of each meal.  If you are on insulin, check your glucose 6 times a day. Check it before each meal and 1 hour after the first bite of each meal.  General guidelines.  More frequent testing is required at the start of insulin treatment. Your caregiver will instruct you.  Test your glucose any time you suspect you have low blood sugar (hypoglycemia).  You should test more often when you change medicines, when you have unusual stress or illness, or in other unusual circumstances. OTHER  THINGS TO KNOW ABOUT GLUCOSE METERS  Measurement Range. Most glucose meters are able to read glucose levels over a broad range of values from as low as 0 to as high as 600 mg/dL. If you get an extremely high or low reading from your meter, you should first confirm it with another reading. Report very high or very low readings to your caregiver.  Whole Blood Glucose versus Plasma Glucose. Some older home glucose meters measure glucose in your whole blood. In a lab or when using some newer home glucose meters, the glucose is measured in your plasma (one component of blood). The difference can be important. It is important for you and your caregiver to know whether your meter gives its results as "whole blood equivalent" or "plasma equivalent."  Display of High and Low Glucose Values. Part of learning how to operate a meter is understanding what the meter results mean. Know how high and low glucose concentrations are displayed  on your meter.  Factors that Affect Glucose Meter Performance. The accuracy of your test results depends on many factors and varies depending on the brand and type of meter. These factors include:  Low red blood cell count (anemia).  Substances in your blood (such as uric acid, vitamin C, and others).  Environmental factors (temperature, humidity, altitude).  Name-brand versus generic test strips.  Calibration. Make sure your meter is set up properly. It is a good idea to do a calibration test with a control solution recommended by the manufacturer of your meter whenever you begin using a fresh bottle of test strips. This will help verify the accuracy of your meter.  Improperly stored, expired, or defective test strips. Keep your strips in a dry place with the lid on.  Soiled meter.  Inadequate blood sample. NEW TECHNOLOGIES FOR GLUCOSE TESTING Alternative site testing Some glucose meters allow testing blood from alternative sites. These include the:  Upper  arm.  Forearm.  Base of the thumb.  Thigh. Sampling blood from alternative sites may be desirable. However, it may have some limitations. Blood in the fingertips show changes in glucose levels more quickly than blood in other parts of the body. This means that alternative site test results may be different from fingertip test results, not because of the meter's ability to test accurately, but because the actual glucose concentration can be different.  Continuous Glucose Monitoring Devices to measure your blood glucose continuously are available, and others are in development. These methods can be more expensive than self-monitoring with a glucose meter. However, it is uncertain how effective and reliable these devices are. Your caregiver will advise you if this approach makes sense for you. IF BLOOD SUGARS ARE CONTROLLED, PEOPLE WITH DIABETES REMAIN HEALTHIER.  SMBG is an important part of the treatment plan of patients with diabetes mellitus. Below are reasons for using SMBG:   It confirms that your glucose is at a specific, healthy level.  It detects hypoglycemia and severe hyperglycemia.  It allows you and your caregiver to make adjustments in response to changes in lifestyle for individuals requiring medicine.  It determines the need for starting insulin therapy in temporary diabetes that happens during pregnancy (gestational diabetes). Document Released: 04/05/2003 Document Revised: 06/25/2011 Document Reviewed: 07/27/2010 [facility] Patient Information 2014 [place], [place].   Continue to monitor your blood sugar. Start new medication called metformin 500 mg twice a day. Call Dr. Juanetta Gosling office on Tuesday for another appointment.

## 2012-12-13 NOTE — ED Notes (Addendum)
Pt c/o elevated blood sugar, states that her blood sugar has been elevated over the past month with readings of over 400-500 during the last week or two, was recently admitted to [facility] hospital for hyperglycemia, saw Dr. Shirlee Latch cardiologist and Dr. Juanetta Gosling this past Thursday who advised pt to increase glipizide to 20 mg BID, pt states that her blood sugar was 458 this am, had eggs and toast for breakfast. Denies any symptoms, states that Dr. Juanetta Gosling advised pt that if her blood sugar was still elevated over the weekend to go to the er. Pt has dobutamine infusion into port in right chest area that is being managed by Dr. Shirlee Latch

## 2012-12-13 NOTE — ED Notes (Signed)
Pt and family updated on plan of care, both given drinks per request,

## 2012-12-13 NOTE — ED Notes (Signed)
Dr. Cook at bedside speaking with pt and family 

## 2012-12-13 NOTE — ED Provider Notes (Signed)
CSN: [id number]     Arrival date & time 12/13/12  1220 History  This chart was scribed for Donnetta Hutching, MD by Quintella Reichert, ED scribe.  This patient was seen in room APA08/APA08 and the patient's care was started at 2:06 PM.    Chief Complaint  Patient presents with  . Hyperglycemia    The history is provided by the patient and a relative. No language interpreter was used.    HPI Comments: [name] is a 62 y.o. female with h/o DM, CAD, and breast cancer who presents to the Emergency Department complaining of 1-2 weeks of waxing-and-waning moderate hyperglycemia.  Pt reports that her CBG has been ranging from the 300s-600s and this morning it was 458.  She denies headache, nausea, vomiting, diarrhea, fatigue or any other associated symptoms.  Family notes that pt was diagnosed with DM one month ago and has been having issues with her blood sugar since then.  She was recently admitted to [facility] for Hyperglycemia and 2 days ago was advised by her PCP to increase her medication dosage.  Since then she has been medicating daily as instructed with 20 mg glipizide (two 5 mg tablets 2x/day).  PCP also advised family to bring her to the ED if her CBG remained high over the weekend.  Pt started treatment for breast cancer in November 2013 and developed heart issues subsequent to chemotherapy.   PCP is Dr. Juanetta Gosling Oncologist is Dr. Mariel Sleet   Past Medical History  Diagnosis Date  . Allergic rhinitis   . Bipolar disorder   . GERD (gastroesophageal reflux disease)   . Hypokalemia   . Hyperlipemia   . COPD (chronic obstructive pulmonary disease)   . Heart palpitations   . Anemia   . Cancer     breast  . 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  . Diabetes mellitus without complication   . Coronary artery disease     Past Surgical History  Procedure Laterality Date  . Sterilization    . Colonoscopy    . Upper gastrointestinal  endoscopy    . Dilation and curettage of uterus    . Colonoscopy with esophagogastroduodenoscopy (egd)  02/21/2012    Procedure: COLONOSCOPY WITH ESOPHAGOGASTRODUODENOSCOPY (EGD);  Surgeon: Malissa Hippo, MD;  Location: [facility] ENDO SU[facility];  Service: Endoscopy;  Laterality: N/A;  2:25  . Right breast lumpectomy      benign/Dr. Lovell Sheehan ?1990's  . Breast biopsy  2013    right breast  . Portacath placement  04/01/2012    Procedure: INSERTION PORT-A-CATH;  Surgeon: Marlane Hatcher, MD;  Location: [facility] ORS;  Service: General;  Laterality: Left;  Insertion Port-A-Cath Left Subclavian under Fluoroscopy    Family History  Problem Relation Age of Onset  . Seizures Mother   . COPD Father   . Hypertension Sister   . COPD Sister   . Arthritis Sister   . Diabetes Brother   . Hypertension Sister   . Breast cancer Sister     63 year survivor  . Arthritis Sister   . Thyroid disease Sister   . Arthritis Sister   . Diabetes Sister   . Arthritis Sister   . Heart disease Brother   . Healthy Brother   . Healthy Brother   . Healthy Son     History  Substance Use Topics  . Smoking status: Current Every Day Smoker -- 0.25 packs/day for 5 years  . Smokeless  tobacco: Never Used     Comment: 5 cigarettes a day   . Alcohol Use: No    OB History   Grav Para Term Preterm Abortions TAB SAB Ect Mult Living                   Review of Systems A complete 10 system review of systems was obtained and all systems are negative except as noted in the HPI and PMH.     Allergies  Ibuprofen and Other  Home Medications   Current Outpatient Rx  Name  Route  Sig  Dispense  Refill  . aspirin 81 MG chewable tablet   Oral   Chew 1 tablet (81 mg total) by mouth daily.         Marland Kitchen atorvastatin (LIPITOR) 40 MG tablet   Oral   Take 40 mg by mouth at bedtime.         . digoxin (LANOXIN) 0.125 MG tablet   Oral   Take 1 tablet (0.125 mg total) by mouth daily.         Marland Kitchen docusate sodium (COLACE) 100  MG capsule   Oral   Take 100 mg by mouth at bedtime.         . fluPHENAZine (PROLIXIN) 2.5 MG tablet   Oral   Take 2.5 mg by mouth at bedtime.         . furosemide (LASIX) 40 MG tablet   Oral   Take 1 tablet (40 mg total) by mouth daily.   30 tablet      . glipiZIDE (GLUCOTROL) 5 MG tablet   Oral   Take 2 tablets (10 mg total) by mouth 2 (two) times daily before a meal.   120 tablet   3   . lisinopril (PRINIVIL,ZESTRIL) 2.5 MG tablet   Oral   Take 0.5 tablets (1.25 mg total) by mouth daily.         Marland Kitchen loratadine (CLARITIN) 10 MG tablet   Oral   Take 10 mg by mouth daily.         Marland Kitchen loxapine (LOXITANE) 10 MG capsule   Oral   Take 10 mg by mouth at bedtime.         . nortriptyline (PAMELOR) 50 MG capsule   Oral   Take 100 mg by mouth at bedtime.          Marland Kitchen omeprazole (PRILOSEC) 20 MG capsule   Oral   Take 20 mg by mouth daily.         . polyvinyl alcohol (LIQUIFILM TEARS) 1.4 % ophthalmic solution   Both Eyes   Place 1 drop into both eyes 2 (two) times daily as needed.   15 mL   0   . Propylene Glycol (SYSTANE BALANCE) 0.6 % SOLN   Ophthalmic   Apply 1 drop to eye 2 (two) times daily as needed (dry eyes).         . ramelteon (ROZEREM) 8 MG tablet   Oral   Take 8 mg by mouth at bedtime as needed for sleep.          Marland Kitchen spironolactone (ALDACTONE) 25 MG tablet   Oral   Take 12.5 mg by mouth daily.         . traZODone (DESYREL) 150 MG tablet   Oral   Take 150 mg by mouth at bedtime.         Marland Kitchen acetaminophen (TYLENOL) 325 MG tablet   Oral  Take 2 tablets (650 mg total) by mouth every 4 (four) hours as needed.         . DOBUTamine (DOBUTREX) 4-5 MG/ML-% infusion   Intravenous   Inject 185.7 mcg/min into the vein continuous.   250 mL      . heparin lock flush 100 UNIT/ML SOLN   Intracatheter   5 mLs (500 Units total) by Intracatheter route every 30 (thirty) days.          BP 90/56  Pulse 115  Temp(Src) 98.5 F (36.9 C)   Resp 18  Ht 5\' 5"  (1.651 m)  Wt 138 lb (62.596 kg)  BMI 22.96 kg/m2  SpO2 94%  Physical Exam  Nursing note and vitals reviewed. Constitutional: She is oriented to person, place, and time. She appears well-developed and well-nourished.  HENT:  Head: Normocephalic and atraumatic.  Eyes: Conjunctivae and EOM are normal. Pupils are equal, round, and reactive to light.  Neck: Normal range of motion. Neck supple.  Cardiovascular: Regular rhythm and normal heart sounds.  Tachycardia present.   No murmur heard. Pulmonary/Chest: Effort normal and breath sounds normal. No respiratory distress. She has no wheezes. She has no rales.  Port on left chest  Abdominal: Soft. Bowel sounds are normal.  Musculoskeletal: Normal range of motion.  Neurological: She is alert and oriented to person, place, and time.  Skin: Skin is warm and dry.  Psychiatric: She has a normal mood and affect.    ED Course  Procedures (including critical care time)  DIAGNOSTIC STUDIES: Oxygen Saturation is 94% on room air, adequate by my interpretation.    COORDINATION OF CARE: 2:14 PM: Discussed treatment plan which includes reviewing pt's records and likely changing her medication regimen.  Pt and family expressed understanding and agreed to plan.   Results for orders placed during the hospital encounter of 12/13/12  GLUCOSE, CAPILLARY      Result Value Range   Glucose-Capillary 371 (*) 70 - 99 mg/dL     MDM  No diagnosis found. Patient is in DKA. She is alert, well-hydrated, no acute distress.   Causes elevated at 371.   Discussed clinical scenario with internist on call who recommended a metformin 500 mg twice a day.   Patient has primary care follow up next week    I personally performed the services described in this documentation, which was scribed in my presence. The recorded information has been reviewed and is accurate.    Donnetta Hutching, MD 12/13/12 1626

## 2012-12-13 NOTE — ED Notes (Addendum)
Was recently hospitalized for hyperglycemia at [facility].  Released Sunday and blood sugars have not been below 400 since going home. Was over 300 on discharge from [place].  Diabetes is relatively new diagnosis for patient.  Currently on Glipizide 20mg  daily.  Not on any insulin coverage.  Patient has completed chemo for breast cancer, taken off radiation therapy d/t weakened heart - was found to have 2 cardiac vessel blockages.

## 2012-12-13 NOTE — ED Notes (Addendum)
Dr Cook at bedside,  

## 2012-12-17 ENCOUNTER — Telehealth (HOSPITAL_COMMUNITY): Payer: Self-pay | Admitting: Adult Health

## 2012-12-17 MED ORDER — MAGNESIUM OXIDE 400 MG PO CAPS: 400.0000 mg | ORAL_CAPSULE | Freq: Two times a day (BID) | ORAL | Status: DC

## 2012-12-17 MED ORDER — POTASSIUM CHLORIDE CRYS ER 20 MEQ PO TBCR: 40.0000 meq | EXTENDED_RELEASE_TABLET | Freq: Every day | ORAL | Status: DC

## 2012-12-17 NOTE — Telephone Encounter (Signed)
Provided [name] (sister) with lab results   Lab 12/13/12  Magnesium 1.4  Potassium 3.1 Glucose 390> faxed to PCP today  Instructed to take 400 mg Mag Oxide twice a day  Instructed to take 80 meq Kdur today then start 40 meq Kdur tomorrow.   Repeat BMET next week. Plan to reinforce medications tomorrow.     [name] 12:01 PM

## 2012-12-18 ENCOUNTER — Encounter (HOSPITAL_COMMUNITY): Payer: Self-pay

## 2012-12-18 ENCOUNTER — Ambulatory Visit (HOSPITAL_COMMUNITY)
Admission: RE | Admit: 2012-12-18 | Discharge: 2012-12-18 | Disposition: A | Discharge status: Home / Self Care | Payer: Medicare Other | Source: Ambulatory Visit | Attending: Internal Medicine | Admitting: Internal Medicine

## 2012-12-18 VITALS — BP 122/64 | HR 110 | Resp 19 | Ht 65.0 in | Wt 132.4 lb

## 2012-12-18 DIAGNOSIS — I2510 Atherosclerotic heart disease of native coronary artery without angina pectoris: Secondary | ICD-10-CM | POA: Insufficient documentation

## 2012-12-18 DIAGNOSIS — I509 Heart failure, unspecified: Secondary | ICD-10-CM

## 2012-12-18 DIAGNOSIS — Z79899 Other long term (current) drug therapy: Secondary | ICD-10-CM | POA: Insufficient documentation

## 2012-12-18 DIAGNOSIS — I5022 Chronic systolic (congestive) heart failure: Secondary | ICD-10-CM | POA: Insufficient documentation

## 2012-12-18 DIAGNOSIS — IMO0001 Reserved for inherently not codable concepts without codable children: Secondary | ICD-10-CM | POA: Insufficient documentation

## 2012-12-18 DIAGNOSIS — Z7982 Long term (current) use of aspirin: Secondary | ICD-10-CM | POA: Insufficient documentation

## 2012-12-18 DIAGNOSIS — C50919 Malignant neoplasm of unspecified site of unspecified female breast: Secondary | ICD-10-CM | POA: Insufficient documentation

## 2012-12-18 DIAGNOSIS — F17200 Nicotine dependence, unspecified, uncomplicated: Secondary | ICD-10-CM | POA: Insufficient documentation

## 2012-12-18 DIAGNOSIS — F209 Schizophrenia, unspecified: Secondary | ICD-10-CM | POA: Insufficient documentation

## 2012-12-18 DIAGNOSIS — I428 Other cardiomyopathies: Secondary | ICD-10-CM | POA: Insufficient documentation

## 2012-12-18 MED ORDER — DOBUTAMINE IN D5W 4-5 MG/ML-% IV SOLN: 1.0000 ug/kg/min | INTRAVENOUS | Status: DC

## 2012-12-18 MED ORDER — LISINOPRIL 2.5 MG PO TABS: 2.5000 mg | ORAL_TABLET | Freq: Every day | ORAL | Status: DC

## 2012-12-18 NOTE — Patient Instructions (Addendum)
Follow up in 1 week  Take 2.5 mg lisinopril daily  Do the following things EVERYDAY: 1) Weigh yourself in the morning before breakfast. Write it down and keep it in a log. 2) Take your medicines as prescribed 3) Eat low salt foods-Limit salt (sodium) to 2000 mg per day.  4) Stay as active as you can everyday 5) Limit all fluids for the day to less than 2 liters

## 2012-12-18 NOTE — Addendum Note (Signed)
Encounter addended by: Laurey Morale, MD on: 12/18/2012 12:06 PM<BR>     Documentation filed: Clinical Notes

## 2012-12-18 NOTE — Progress Notes (Signed)
Patient ID: [name], female   DOB: 1950-05-15, 62 y.o.   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   Admitted to [facility] 7/16 [facility] through 11/07/12 with new acute systolic heart failure thought to be from adriamycin. 10/29/12 ECHO: Reviewed echo from [facility]. Showed EF 20% with global hypokinesis, moderate RV dysfunction, small pericardial effusion.  Had a cath as noted below milrinone was stopped and dobutamine begun given hypotension requiring norephinephrine.  Failed Dobutamine wean therefore she was discharged to [facility] on dobutamine. Difficulty titrating meds due to hypotension. Discharge weight 136 pounds.  As noted below, LHC showed 60-70% LM stenosis and 80% long RCA stenosis.  This coronary disease was not thought to be the culprit for her cardiomyopathy.   RHC/LHC 7/18 (on milrinone and norepinephrine) RA mean 18  RV 45/20  PA 41/21, mean 29  PCWP mean 21  LV 95/16  AO 109/67  Oxygen saturations:  PA 40%  AO 95%  Cardiac Output (Fick) 3.5  Cardiac Index (Fick) 2.05  Cardiac Output (Thermo) 2.9  Cardiac Index (Thermo) 1.7  SVR 1799  Coronary angiography:  Left mainstem: 60-70% distal left main stenosis.  Left anterior descending (LAD): Luminal irregularities in the LAD. 80% ostial stenosis small D1.  Left circumflex (LCx): Small vessel, no significant disease.  Right coronary artery (RCA): Long 80% mid RCA stenosis.  Left ventriculography: EF 25-30%, global hypokinesis.    Since her last visit she was admitted to [facility] for uncontrolled DM.  She returns for follow up. ast visit dobutamine was cut back to 2 mcg per hour.  Denies SOB/PND/Orthopnea/dizziness. Family reports dyspnea with exertion though patient denies. Weight  at home 130-131 pounds. [facility] to manage home dobutamine. Medications managed by her sister.   ROS: All systems negative except as listed in HPI, PMH and Problem List.  SH: Smoker prior to hospitalization.  Lives with sisters.    FH: No premature CAD  Labs (7/14): K 5, creatinine 0.66, co-ox 71% Labs (8/14): K 4.1, creatinine 1.17 Labs (12/12/12) K 3.1 Creatinine 0.81 Magnesium 1.4 Potassium increased  And Magnesium added 12/17/12    Past Medical History  Diagnosis Date  . Allergic rhinitis   . Bipolar disorder   . GERD (gastroesophageal reflux disease)   . Hypokalemia   . Hyperlipemia   . COPD (chronic obstructive pulmonary disease)   . Heart palpitations   . Anemia   . Cancer     breast  . 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  . Diabetes mellitus without complication   . Coronary artery disease   - Nonischemic cardiomyopathy on dobutamine - CAD  Current Outpatient Prescriptions  Medication Sig Dispense Refill  . acetaminophen (TYLENOL) 325 MG tablet Take 2 tablets (650 mg total) by mouth every 4 (four) hours as needed.      Marland Kitchen aspirin 81 MG chewable tablet Chew 1 tablet (81 mg total) by mouth daily.      Marland Kitchen atorvastatin (LIPITOR) 40 MG tablet Take 40 mg by mouth at bedtime.      . digoxin (LANOXIN) 0.125 MG tablet Take 1 tablet (0.125 mg total) by mouth daily.      . DOBUTamine (DOBUTREX) 4-5 MG/ML-% infusion  Inject 185.7 mcg/min into the vein continuous.  250 mL    . docusate sodium (COLACE) 100 MG capsule Take 100 mg by mouth at bedtime.      . fluPHENAZine (PROLIXIN) 2.5 MG tablet Take 2.5 mg by mouth at bedtime.      . furosemide (LASIX) 40 MG tablet Take 1 tablet (40 mg total) by mouth daily.  30 tablet    . glipiZIDE (GLUCOTROL) 5 MG tablet Take 2 tablets (10 mg total) by mouth 2 (two) times daily before a meal.  120 tablet  3  . heparin lock flush 100 UNIT/ML SOLN 5 mLs (500 Units total) by Intracatheter route every 30 (thirty) days.       Marland Kitchen lisinopril (PRINIVIL,ZESTRIL) 2.5 MG tablet Take 0.5 tablets (1.25 mg total) by mouth daily.      Marland Kitchen loratadine (CLARITIN) 10 MG tablet Take 10 mg by mouth daily.      Marland Kitchen loxapine (LOXITANE) 10 MG capsule Take 10 mg by mouth at bedtime.      . Magnesium Oxide 400 MG CAPS Take 1 capsule (400 mg total) by mouth 2 (two) times daily.  60 capsule  3  . metFORMIN (GLUCOPHAGE) 500 MG tablet Take 1 tablet (500 mg total) by mouth 2 (two) times daily with a meal.  60 tablet  0  . metFORMIN (GLUCOPHAGE) 500 MG tablet Take 1 tablet (500 mg total) by mouth 2 (two) times daily with a meal.  60 tablet  0  . nortriptyline (PAMELOR) 50 MG capsule Take 100 mg by mouth at bedtime.       Marland Kitchen omeprazole (PRILOSEC) 20 MG capsule Take 20 mg by mouth daily.      . polyvinyl alcohol (LIQUIFILM TEARS) 1.4 % ophthalmic solution Place 1 drop into both eyes 2 (two) times daily as needed.  15 mL  0  . potassium chloride SA (K-DUR,KLOR-CON) 20 MEQ tablet Take 2 tablets (40 mEq total) by mouth daily. Take 40 meq daily and additional as instructed at the HF clinic  90 tablet  3  . Propylene Glycol (SYSTANE BALANCE) 0.6 % SOLN Apply 1 drop to eye 2 (two) times daily as needed (dry eyes).      . ramelteon (ROZEREM) 8 MG tablet Take 8 mg by mouth at bedtime as needed for sleep.       Marland Kitchen spironolactone (ALDACTONE) 25 MG tablet Take 12.5 mg by mouth daily.      . traZODone (DESYREL) 150 MG tablet Take 150 mg by mouth at bedtime.       No current facility-administered medications for this encounter.     PHYSICAL EXAM: Filed Vitals:   12/18/12 1122  BP: 122/64  Pulse: 110  Resp: 19  Height: 5\' 5"  (1.651 m)  Weight: 132 lb 6.4 oz (60.056 kg)  SpO2: 97%    General: NAD No resp difficulty 3 sisters present  HEENT: normal  Neck: supple. JVP 5-6 cm Carotids 2+ bilat; no bruits. No lymphadenopathy or thryomegaly appreciated.  Cor: PMI nondisplaced. Mildly tachycardic, regular rate & regular rhythm. No rubs,  or murmurs. L porta  cath. No S3/S4 Lungs: clear  Abdomen: soft, nontender, mild distention. No hepatosplenomegaly. No bruits or masses. Good bowel sounds.  Extremities: no cyanosis, clubbing, rash, edema  Neuro: alert & orientedx3, cranial nerves grossly intact. moves all 4 extremities w/o difficulty. Affect pleasant   ASSESSMENT & PLAN: 1. Chronic Systolic Heart Failure:  ECHO EF 20% with RV failure thought to be from Adriamycin toxicity.  Chronic Adriamycin toxicity most often presents in the first year after medication use, with risk probably peaking at around 3 months.  On home dobutamine at 3 mcg via porta cath. She is not a good candidate for advanced therapies given schizophrenia and breast cancer.  BP is higher today than at last appointment.  - On chronic dobutamine at 3 mcg/kg/min via porta cath.  Cut back dobutamine to1 mcg/kg/min. Follow up next week and anticipate cutting off Dobutamine  - No beta blocker due to low output and dobutamine use. - Increase lisinopril to  2.5 mg daily .   - Volume status stable. Continue lasix 40 mg daily and spironolactone 25 mg daily.  - Continue weekly BMET a12nd CBC/diff while on Dobutamine.  - Continue [facility] for home Dobutaimine - Repeat echo in 3 months post-hospital discharge.   2. CAD: See anatomy above.  Has significant CAD but do not think that it caused her cardiomyopathy.  If she recovers, may need to reassess in future.  Would consider IVUS of LM lesion for significance.  No ischemic symptoms.  Continue aspirin and statin. Check lipids next week by [facility].   3. Smoking: Active smoker.  Needs to quit.   4. Schizophrenia: per primary care/psych  5. R Breast Cancer:  Followed by Dr Jacalyn Lefevre. No further plans for radiation.    6. Uncontrolled DM: He will follow up with Dr Juanetta Gosling today for insulin adjustments.   CLEGG,AMY 12/18/2012 11:29 AM  Patient seen with NP, agree with the above note.  Decrease dobutamine to 1, then followup in 1 week.  Hopefully will then  turn off dobutamine.  Increase lisinopril to 2.5 mg daily.  Echo in a couple of months.  Continue to work on diabetes control.   Marca Ancona 12/18/2012 12:05 PM

## 2012-12-22 ENCOUNTER — Telehealth (HOSPITAL_COMMUNITY): Payer: Self-pay | Admitting: Cardiology

## 2012-12-22 NOTE — Telephone Encounter (Signed)
Left mess for [facility] to call back, pt's mag level has been 1.5, 1.2

## 2012-12-22 NOTE — Telephone Encounter (Signed)
[name] wanted to call with report that pts weekly magnesium levels always return elevated. Wanted to make sure someone was getting these results

## 2012-12-23 ENCOUNTER — Telehealth (HOSPITAL_COMMUNITY): Payer: Self-pay | Admitting: Anesthesiology

## 2012-12-24 ENCOUNTER — Ambulatory Visit (HOSPITAL_COMMUNITY)
Admission: RE | Admit: 2012-12-24 | Discharge: 2012-12-24 | Disposition: A | Discharge status: Home / Self Care | Payer: Medicare Other | Source: Ambulatory Visit | Attending: Internal Medicine | Admitting: Internal Medicine

## 2012-12-24 ENCOUNTER — Encounter (HOSPITAL_COMMUNITY): Payer: Self-pay

## 2012-12-24 VITALS — BP 108/72 | HR 108 | Wt 133.0 lb

## 2012-12-24 DIAGNOSIS — C50919 Malignant neoplasm of unspecified site of unspecified female breast: Secondary | ICD-10-CM

## 2012-12-24 DIAGNOSIS — F17200 Nicotine dependence, unspecified, uncomplicated: Secondary | ICD-10-CM | POA: Insufficient documentation

## 2012-12-24 DIAGNOSIS — F209 Schizophrenia, unspecified: Secondary | ICD-10-CM

## 2012-12-24 DIAGNOSIS — C50911 Malignant neoplasm of unspecified site of right female breast: Secondary | ICD-10-CM

## 2012-12-24 DIAGNOSIS — I509 Heart failure, unspecified: Secondary | ICD-10-CM | POA: Insufficient documentation

## 2012-12-24 DIAGNOSIS — E119 Type 2 diabetes mellitus without complications: Secondary | ICD-10-CM | POA: Insufficient documentation

## 2012-12-24 DIAGNOSIS — E785 Hyperlipidemia, unspecified: Secondary | ICD-10-CM

## 2012-12-24 DIAGNOSIS — I5022 Chronic systolic (congestive) heart failure: Secondary | ICD-10-CM

## 2012-12-24 DIAGNOSIS — I2510 Atherosclerotic heart disease of native coronary artery without angina pectoris: Secondary | ICD-10-CM | POA: Insufficient documentation

## 2012-12-24 MED ORDER — LISINOPRIL 2.5 MG PO TABS: ORAL_TABLET | ORAL | Status: DC

## 2012-12-24 MED ORDER — POTASSIUM CHLORIDE CRYS ER 20 MEQ PO TBCR: 40.0000 meq | EXTENDED_RELEASE_TABLET | Freq: Two times a day (BID) | ORAL | Status: DC

## 2012-12-24 MED ORDER — DIGOXIN 125 MCG PO TABS: 0.0625 mg | ORAL_TABLET | Freq: Every day | ORAL | Status: DC

## 2012-12-24 NOTE — Patient Instructions (Addendum)
Take 1.25 mg (1/2 tablet)  lisinopril in the am and 2.5 mg (1 tablet) in the pm.  Cut digoxin back to 0.0625 mg (1/2 tablet) daily.  Take an additional 40 meq potassium today then increase dose to 40 meq (2 tabs) Twice daily   Will call about stopping dobutamine.  Follow up 2 weeks.  Do the following things EVERYDAY: 1) Weigh yourself in the morning before breakfast. Write it down and keep it in a log. 2) Take your medicines as prescribed 3) Eat low salt foods-Limit salt (sodium) to 2000 mg per day.  4) Stay as active as you can everyday 5) Limit all fluids for the day to less than 2 liters

## 2012-12-24 NOTE — Progress Notes (Signed)
Patient ID: [name], female   DOB: [birth year], 62 y.o.   MRN: [id number]  Weight Range 130-132 pounds  Baseline proBNP   Dr. Juanetta Gosling (PCP)   HPI: [name] is a 62 y/o female with a PMHx of COPD, CVA, HLD, DM2, schizophrenia and R. Breast CA (triple negative) who is currently being treated with radiation and s/p chemotherapy (Adriamycin).   Admitted to [facility] 7/16 MC through 11/07/12 with new acute systolic heart failure thought to be from adriamycin. 10/29/12 ECHO: Reviewed echo from [facility] Showed EF 20% with global hypokinesis, moderate RV dysfunction, small pericardial effusion.  Had a cath as noted below milrinone was stopped and dobutamine begun given hypotension requiring norephinephrine.  Failed Dobutamine wean therefore she was discharged to [facility] on dobutamine. Difficulty titrating meds due to hypotension. Discharge weight 136 pounds.  As noted below, LHC showed 60-70% LM stenosis and 80% long RCA stenosis.  This coronary disease was not thought to be the culprit for her cardiomyopathy.   RHC/LHC 7/18 (on milrinone and norepinephrine) RA mean 18  RV 45/20  PA 41/21, mean 29  PCWP mean 21  LV 95/16  AO 109/67  Oxygen saturations:  PA 40%  AO 95%  Cardiac Output (Fick) 3.5  Cardiac Index (Fick) 2.05  Cardiac Output (Thermo) 2.9  Cardiac Index (Thermo) 1.7  SVR 1799  Coronary angiography:  Left mainstem: 60-70% distal left main stenosis.  Left anterior descending (LAD): Luminal irregularities in the LAD. 80% ostial stenosis small D1.  Left circumflex (LCx): Small vessel, no significant disease.  Right coronary artery (RCA): Long 80% mid RCA stenosis.  Left ventriculography: EF 25-30%, global hypokinesis.    Follow up: Last visit dobutamine decreased to 1 mcg/hr and started lisinopril at 2.5 mg daily. She also was started on levemir 25 units q day d/t high blood sugars. Reports sleeping a lot. Denies SOB, PND, orthopnea, or CP. Denies dizziness. Weight  at home 129-131 lbs. [facility] to manage home dobutamine. Medications managed by her sister. Blood sugar 219 this am.   ROS: All systems negative except as listed in HPI, PMH and Problem List.  SH: Smoker prior to hospitalization.  Lives with sisters.    FH: No premature CAD  Labs (7/14): K 5, creatinine 0.66, co-ox 71% Labs (8/14): K 4.1, creatinine 1.17 Labs (12/12/12) K 3.1 Creatinine 0.81 Magnesium 1.4 Potassium increased  And Magnesium added 12/17/12    Past Medical History  Diagnosis Date  . Allergic rhinitis   . Bipolar disorder   . GERD (gastroesophageal reflux disease)   . Hypokalemia   . Hyperlipemia   . COPD (chronic obstructive pulmonary disease)   . Heart palpitations   . Anemia   . Cancer     breast  . 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  . Diabetes mellitus without complication   . Coronary artery disease   - Nonischemic cardiomyopathy on dobutamine - CAD  Current Outpatient Prescriptions  Medication Sig Dispense Refill  . acetaminophen (TYLENOL) 325 MG tablet Take 2 tablets (650 mg total) by mouth every 4 (four) hours as needed.      Marland Kitchen aspirin 81 MG chewable tablet Chew 1 tablet (81 mg total) by mouth daily.      Marland Kitchen atorvastatin (LIPITOR) 40 MG tablet Take 40 mg by mouth at bedtime.      . digoxin (LANOXIN) 0.125 MG tablet Take 1 tablet (0.125 mg total) by mouth daily.      Marland Kitchen  DOBUTamine (DOBUTREX) 4-5 MG/ML-% infusion Inject 61.9 mcg/min into the vein continuous.  250 mL    . docusate sodium (COLACE) 100 MG capsule Take 100 mg by mouth at bedtime.      . fluPHENAZine (PROLIXIN) 2.5 MG tablet Take 2.5 mg by mouth at bedtime.      . furosemide (LASIX) 40 MG tablet Take 1 tablet (40 mg total) by mouth daily.  30 tablet    . glipiZIDE (GLUCOTROL) 5 MG tablet Take 2 tablets (10 mg total) by mouth 2 (two) times daily before a meal.  120 tablet  3  . heparin lock flush 100 UNIT/ML SOLN 5 mLs (500 Units total) by Intracatheter route  every 30 (thirty) days.      Marland Kitchen lisinopril (PRINIVIL,ZESTRIL) 2.5 MG tablet Take 1 tablet (2.5 mg total) by mouth daily.  30 tablet  3  . loratadine (CLARITIN) 10 MG tablet Take 10 mg by mouth daily.      Marland Kitchen loxapine (LOXITANE) 10 MG capsule Take 10 mg by mouth at bedtime.      . Magnesium Oxide 400 MG CAPS Take 1 capsule (400 mg total) by mouth 2 (two) times daily.  60 capsule  3  . nortriptyline (PAMELOR) 50 MG capsule Take 100 mg by mouth at bedtime.       Marland Kitchen omeprazole (PRILOSEC) 20 MG capsule Take 20 mg by mouth daily.      . polyvinyl alcohol (LIQUIFILM TEARS) 1.4 % ophthalmic solution Place 1 drop into both eyes 2 (two) times daily as needed.  15 mL  0  . potassium chloride SA (K-DUR,KLOR-CON) 20 MEQ tablet Take 2 tablets (40 mEq total) by mouth daily. Take 40 meq daily and additional as instructed at the HF clinic  90 tablet  3  . Propylene Glycol (SYSTANE BALANCE) 0.6 % SOLN Apply 1 drop to eye 2 (two) times daily as needed (dry eyes).      . ramelteon (ROZEREM) 8 MG tablet Take 8 mg by mouth at bedtime as needed for sleep.       Marland Kitchen spironolactone (ALDACTONE) 25 MG tablet Take 12.5 mg by mouth daily.      . traZODone (DESYREL) 150 MG tablet Take 150 mg by mouth at bedtime.       No current facility-administered medications for this encounter.     PHYSICAL EXAM: Filed Vitals:   12/24/12 1434  BP: 108/72  Pulse: 108  Weight: 133 lb (60.328 kg)  SpO2: 94%    General: NAD No resp difficulty 3 sisters present  HEENT: normal  Neck: supple. JVP 5-6 cm Carotids 2+ bilat; no bruits. No lymphadenopathy or thryomegaly appreciated.  Cor: PMI nondisplaced. Mildly tachycardic, regular rate & regular rhythm. No rubs,  or murmurs. L porta cath. No S3/S4 Lungs: clear  Abdomen: soft, nontender, mild distention. No hepatosplenomegaly. No bruits or masses. Good bowel sounds.  Extremities: no cyanosis, clubbing, rash, edema  Neuro: alert & orientedx3, cranial nerves grossly intact. moves all 4  extremities w/o difficulty. Affect pleasant   ASSESSMENT & PLAN: 1. Chronic Systolic Heart Failure:  ECHO EF 20% with RV failure thought to be from Adriamycin toxicity.  Chronic Adriamycin toxicity most often presents in the first year after medication use, with risk probably peaking at around 3 months. She remains on home dobutamine at 1 mcg via porta cath. She is not a good candidate for advanced therapies given schizophrenia and breast cancer.   - Have been slowly trying to wean off dobutamine. NYHA  III symptoms and volume status good. Will have [facility] discontinue dobutamine on Monday (Do not want to d/c on Friday when they do dressing change d/t the clinic is closed over the weekend if the patient gets in trouble.) Will leave hickman catheter in once d/c to see if she can tolerate being off medication. - No beta blocker due to low output and dobutamine use. - Will increase lisinopril to 1.25/2.5 mg daily .   - Continue lasix 40 mg daily and spironolactone 25 mg daily.  - Will get BMET on Friday  - Repeat echo in 3 months post-hospital discharge.   2. CAD: See anatomy above.  Has significant CAD but do not think that it caused her cardiomyopathy.  If she recovers, may need to reassess in future.  Would consider IVUS of LM lesion for significance.  No ischemic symptoms.  Continue aspirin and statin.  - lipids checked, will fax to PCP to address   3. Smoking: Active smoker.  Needs to quit.   4. Schizophrenia: per primary care/psych - continue current meds  5. R Breast Cancer:  Followed by Dr Jacalyn Lefevre. No further plans for radiation.    6. Uncontrolled DM:  - Recently started on levemir and encouraged her to continue to follow up with PCP for adjustments.   Ulla Potash B NP-C 12/24/2012 10:01 PM

## 2013-01-01 NOTE — Telephone Encounter (Signed)
error 

## 2013-01-06 ENCOUNTER — Other Ambulatory Visit (HOSPITAL_COMMUNITY): Payer: Self-pay | Admitting: *Deleted

## 2013-01-06 ENCOUNTER — Ambulatory Visit (HOSPITAL_COMMUNITY)
Admission: RE | Admit: 2013-01-06 | Discharge: 2013-01-06 | Disposition: A | Discharge status: Home / Self Care | Payer: Medicare Other | Source: Ambulatory Visit | Attending: Internal Medicine | Admitting: Internal Medicine

## 2013-01-06 VITALS — BP 100/62 | HR 108 | Wt 132.5 lb

## 2013-01-06 DIAGNOSIS — I509 Heart failure, unspecified: Secondary | ICD-10-CM

## 2013-01-06 DIAGNOSIS — I5022 Chronic systolic (congestive) heart failure: Secondary | ICD-10-CM | POA: Insufficient documentation

## 2013-01-06 MED ORDER — POTASSIUM CHLORIDE CRYS ER 20 MEQ PO TBCR: 40.0000 meq | EXTENDED_RELEASE_TABLET | Freq: Two times a day (BID) | ORAL | Status: DC

## 2013-01-06 NOTE — Progress Notes (Signed)
Patient ID: [name], female   DOB: 09/01/1950, 62 y.o.   MRN: [id number]  Weight Range 130-132 pounds  Baseline proBNP   Dr. Juanetta Gosling (PCP)   HPI: [name] is a 62 y/o female with a PMHx of COPD, CVA, HLD, DM2, schizophrenia and R. Breast CA (triple negative) who is currently being treated with radiation and s/p chemotherapy (Adriamycin).   Admitted to [facility] 7/16 MC through 11/07/12 with new acute systolic heart failure thought to be from adriamycin. 10/29/12 ECHO: Reviewed echo from [facility]. Showed EF 20% with global hypokinesis, moderate RV dysfunction, small pericardial effusion.  Had a cath as noted below milrinone was stopped and dobutamine begun given hypotension requiring norephinephrine.  Failed Dobutamine wean therefore she was discharged to [facility] on dobutamine. Difficulty titrating meds due to hypotension. Discharge weight 136 pounds.  As noted below, LHC showed 60-70% LM stenosis and 80% long RCA stenosis.  This coronary disease was not thought to be the culprit for her cardiomyopathy.   RHC/LHC 7/18 (on milrinone and norepinephrine) RA mean 18  RV 45/20  PA 41/21, mean 29  PCWP mean 21  LV 95/16  AO 109/67  Oxygen saturations:  PA 40%  AO 95%  Cardiac Output (Fick) 3.5  Cardiac Index (Fick) 2.05  Cardiac Output (Thermo) 2.9  Cardiac Index (Thermo) 1.7  SVR 1799  Coronary angiography:  Left mainstem: 60-70% distal left main stenosis.  Left anterior descending (LAD): Luminal irregularities in the LAD. 80% ostial stenosis small D1.  Left circumflex (LCx): Small vessel, no significant disease.  Right coronary artery (RCA): Long 80% mid RCA stenosis.  Left ventriculography: EF 25-30%, global hypokinesis.    Follow up: Since last visit stopped dobutamine and increased lisinopril to 1.25/2.5 mg. Feels ok. Denies SOB, CP, orthopnea, dizziness or edema. + DOE with minimal exertion. Not doing much at home, does not really leave the house. Was sick on  stomach this am and had some vomiting. Not smoking and reports has not since around January 2014, however documentation says she has been smoking. Weight at home 128-130 lbs. SBP 76-130/60-70s. Saw Dr. Elyse Hsu at [facility] and felt not to be candidate for advanced therapies.  ROS: All systems negative except as listed in HPI, PMH and Problem List.  SH: Smoker prior to hospitalization.  Lives with sisters.    FH: No premature CAD  Labs (7/14): K 5, creatinine 0.66, co-ox 71%          (8/14): K 4.1, creatinine 1.17          (12/12/12) K 3.1 Creatinine 0.81 Magnesium 1.4 Potassium increased  And Magnesium added 12/17/12          (9/14): K+ 5.2, creatinine 1.0   Past Medical History  Diagnosis Date  . Allergic rhinitis   . Bipolar disorder   . GERD (gastroesophageal reflux disease)   . Hypokalemia   . Hyperlipemia   . COPD (chronic obstructive pulmonary disease)   . Heart palpitations   . Anemia   . Cancer     breast  . 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  . Diabetes mellitus without complication   . Coronary artery disease   - Nonischemic cardiomyopathy on dobutamine - CAD  Current Outpatient Prescriptions  Medication Sig Dispense Refill  . acetaminophen (TYLENOL) 325 MG tablet Take 2 tablets (650 mg total) by mouth every 4 (four) hours as needed.      Marland Kitchen  aspirin 81 MG chewable tablet Chew 1 tablet (81 mg total) by mouth daily.      Marland Kitchen atorvastatin (LIPITOR) 40 MG tablet Take 40 mg by mouth at bedtime.      . digoxin (LANOXIN) 0.125 MG tablet Take 0.5 tablets (0.0625 mg total) by mouth daily.      Marland Kitchen docusate sodium (COLACE) 100 MG capsule Take 100 mg by mouth at bedtime.      . fluPHENAZine (PROLIXIN) 2.5 MG tablet Take 2.5 mg by mouth at bedtime.      . furosemide (LASIX) 40 MG tablet Take 1 tablet (40 mg total) by mouth daily.  30 tablet    . glipiZIDE (GLUCOTROL) 5 MG tablet Take 2 tablets (10 mg total) by mouth 2 (two) times  daily before a meal.  120 tablet  3  . heparin lock flush 100 UNIT/ML SOLN 5 mLs (500 Units total) by Intracatheter route every 30 (thirty) days.      . insulin detemir (LEVEMIR) 100 UNIT/ML injection Inject 30 Units into the skin daily.       Marland Kitchen lisinopril (PRINIVIL,ZESTRIL) 2.5 MG tablet Take 1/2 tab in AM and 1 tab in PM  30 tablet  3  . loratadine (CLARITIN) 10 MG tablet Take 10 mg by mouth daily.      Marland Kitchen loxapine (LOXITANE) 10 MG capsule Take 10 mg by mouth at bedtime.      . Magnesium Oxide 400 MG CAPS Take 1 capsule (400 mg total) by mouth 2 (two) times daily.  60 capsule  3  . nortriptyline (PAMELOR) 50 MG capsule Take 100 mg by mouth at bedtime.       Marland Kitchen omeprazole (PRILOSEC) 20 MG capsule Take 20 mg by mouth daily.      . polyvinyl alcohol (LIQUIFILM TEARS) 1.4 % ophthalmic solution Place 1 drop into both eyes 2 (two) times daily as needed.  15 mL  0  . potassium chloride SA (K-DUR,KLOR-CON) 20 MEQ tablet Take 2 tablets (40 mEq total) by mouth 2 (two) times daily.  90 tablet  3  . Propylene Glycol (SYSTANE BALANCE) 0.6 % SOLN Apply 1 drop to eye 2 (two) times daily as needed (dry eyes).      . ramelteon (ROZEREM) 8 MG tablet Take 8 mg by mouth at bedtime as needed for sleep.       Marland Kitchen spironolactone (ALDACTONE) 25 MG tablet Take 12.5 mg by mouth daily.      . traZODone (DESYREL) 150 MG tablet Take 150 mg by mouth at bedtime.       No current facility-administered medications for this encounter.    Filed Vitals:   01/06/13 1110  BP: 100/62  Pulse: 108  Weight: 132 lb 8 oz (60.102 kg)  SpO2: 96%    PHYSICAL EXAM: General: NAD No resp difficulty 2 sisters present  HEENT: normal  Neck: supple. JVP 5-6 cm Carotids 2+ bilat; no bruits. No lymphadenopathy or thryomegaly appreciated.  Cor: Mildly tachycardic, regular rate & regular rhythm. +s3 L porta cath. No S3/S4 Lungs: clear  Abdomen: soft, nontender, no distention. No hepatosplenomegaly. No bruits or masses. Good bowel sounds.   Extremities: no cyanosis, clubbing, rash, edema  Neuro: alert & orientedx3, cranial nerves grossly intact. moves all 4 extremities w/o difficulty. Flat affect   ASSESSMENT & PLAN: 1. Chronic Systolic Heart Failure:  ECHO EF 20% with RV failure thought to be from Adriamycin toxicity.She is not a good candidate for advanced therapies given schizophrenia and breast cancer.   -  Dobutamine discontinued for at least a week. Currently NYHA IIIb symptoms and volume status good. Continue lasix 40 mg daily. - No beta blocker due to low output. - Continue lisinopril 1.25/2.5 mg daily and spironolactone 12.5 mg daily.    - Will get BMET with [facility].  - Will refer to hospice   2. CAD: See anatomy above.  Has significant CAD but do not think that it caused her cardiomyopathy.  If she recovers, may need to reassess in future.  Would consider IVUS of LM lesion for significance.  No ischemic symptoms.  Continue aspirin and statin.  - PCP manages   3. Smoking: Has abstained since January 2014 per patient and family today.    4. Schizophrenia: per primary care/psych - continue current meds  5.Breast Cancer:  Followed by Dr Jacalyn Lefevre. No further plans for radiation.    6. Uncontrolled DM:  - Following up with PCP  F/U 3-4 weeks  Aundria Rud NP-C 01/06/2013 11:25 AM  Patient seen and examined with Ulla Potash, NP. We discussed all aspects of the encounter. I agree with the assessment and plan as stated above. She has advanced HF with very tenuous hemodynamics. Volume status looks good. BP too low to increase HF meds or add b-blocker. Not a candidate for advanced therapies. Long talk about Code Status and prognosis. Quoted 50% mortality at 6 months. Discussed role of Hospice at length and they are interested in referral for services and continued discussions of goals of care. (did not decide code status today). We also discussed fact that we can restart dobutamine as needed for palliation of HF  services.  Total time spent 60 minutes including ~40 minutes on discussions above.  Daniel Bensimhon,MD 1:08 PM

## 2013-01-06 NOTE — Patient Instructions (Addendum)
Continue all medications as is.  Will send referral for Hospice.  Call if she is getting worse.  F/U 3-4 weeks  Do the following things EVERYDAY: 1) Weigh yourself in the morning before breakfast. Write it down and keep it in a log. 2) Take your medicines as prescribed 3) Eat low salt foods-Limit salt (sodium) to 2000 mg per day.  4) Stay as active as you can everyday 5) Limit all fluids for the day to less than 2 liters 6)

## 2013-01-15 ENCOUNTER — Encounter: Payer: Self-pay | Admitting: Internal Medicine

## 2013-01-16 ENCOUNTER — Encounter: Payer: Self-pay | Admitting: Internal Medicine

## 2013-01-26 ENCOUNTER — Ambulatory Visit (HOSPITAL_COMMUNITY): Payer: Medicare Other

## 2013-01-27 NOTE — Progress Notes (Signed)
This encounter was created in error - please disregard.

## 2013-01-29 ENCOUNTER — Ambulatory Visit (HOSPITAL_COMMUNITY)
Admission: RE | Admit: 2013-01-29 | Discharge: 2013-01-29 | Disposition: A | Discharge status: Home / Self Care | Source: Ambulatory Visit | Attending: Internal Medicine | Admitting: Internal Medicine

## 2013-01-29 VITALS — BP 112/62 | HR 88 | Wt 130.8 lb

## 2013-01-29 DIAGNOSIS — C50919 Malignant neoplasm of unspecified site of unspecified female breast: Secondary | ICD-10-CM | POA: Diagnosis not present

## 2013-01-29 DIAGNOSIS — I5022 Chronic systolic (congestive) heart failure: Secondary | ICD-10-CM | POA: Diagnosis present

## 2013-01-29 DIAGNOSIS — I509 Heart failure, unspecified: Secondary | ICD-10-CM

## 2013-01-29 DIAGNOSIS — Z79899 Other long term (current) drug therapy: Secondary | ICD-10-CM | POA: Diagnosis not present

## 2013-01-29 DIAGNOSIS — E119 Type 2 diabetes mellitus without complications: Secondary | ICD-10-CM | POA: Diagnosis not present

## 2013-01-29 DIAGNOSIS — Z87891 Personal history of nicotine dependence: Secondary | ICD-10-CM | POA: Diagnosis not present

## 2013-01-29 DIAGNOSIS — Z8673 Personal history of transient ischemic attack (TIA), and cerebral infarction without residual deficits: Secondary | ICD-10-CM | POA: Insufficient documentation

## 2013-01-29 DIAGNOSIS — K219 Gastro-esophageal reflux disease without esophagitis: Secondary | ICD-10-CM | POA: Diagnosis not present

## 2013-01-29 DIAGNOSIS — I2510 Atherosclerotic heart disease of native coronary artery without angina pectoris: Secondary | ICD-10-CM | POA: Diagnosis not present

## 2013-01-29 DIAGNOSIS — J449 Chronic obstructive pulmonary disease, unspecified: Secondary | ICD-10-CM | POA: Diagnosis not present

## 2013-01-29 DIAGNOSIS — Z794 Long term (current) use of insulin: Secondary | ICD-10-CM | POA: Diagnosis not present

## 2013-01-29 DIAGNOSIS — E785 Hyperlipidemia, unspecified: Secondary | ICD-10-CM | POA: Insufficient documentation

## 2013-01-29 DIAGNOSIS — J4489 Other specified chronic obstructive pulmonary disease: Secondary | ICD-10-CM | POA: Insufficient documentation

## 2013-01-29 DIAGNOSIS — F209 Schizophrenia, unspecified: Secondary | ICD-10-CM | POA: Diagnosis not present

## 2013-01-29 DIAGNOSIS — I428 Other cardiomyopathies: Secondary | ICD-10-CM | POA: Diagnosis not present

## 2013-01-29 NOTE — Progress Notes (Signed)
Patient ID: [name], female   DOB: 11/02/1950, 62 y.o.   MRN: [id number]   Weight Range 130-132 pounds  Baseline proBNP   Dr. Juanetta Gosling (PCP)   HPI: [name] is a 62 y/o female with a PMHx of COPD, CVA, HLD, DM2, schizophrenia and R. Breast CA (triple negative) who is currently being treated with radiation and s/p chemotherapy (Adriamycin).   Admitted to [facility] 7/16 MC through 11/07/12 with new acute systolic heart failure thought to be from adriamycin. 10/29/12 ECHO: Reviewed echo from [facility] Showed EF 20% with global hypokinesis, moderate RV dysfunction, small pericardial effusion.  Had a cath as noted below milrinone was stopped and dobutamine begun given hypotension requiring norephinephrine.  Failed Dobutamine wean therefore she was discharged to [facility] on dobutamine. Difficulty titrating meds due to hypotension. Discharge weight 136 pounds.  As noted below, LHC showed 60-70% LM stenosis and 80% long RCA stenosis.  This coronary disease was not thought to be the culprit for her cardiomyopathy.   RHC/LHC 7/18 (on milrinone and norepinephrine) RA mean 18  RV 45/20  PA 41/21, mean 29  PCWP mean 21  LV 95/16  AO 109/67  Oxygen saturations:  PA 40%  AO 95%  Cardiac Output (Fick) 3.5  Cardiac Index (Fick) 2.05  Cardiac Output (Thermo) 2.9  Cardiac Index (Thermo) 1.7  SVR 1799  Coronary angiography:  Left mainstem: 60-70% distal left main stenosis.  Left anterior descending (LAD): Luminal irregularities in the LAD. 80% ostial stenosis small D1.  Left circumflex (LCx): Small vessel, no significant disease.  Right coronary artery (RCA): Long 80% mid RCA stenosis.  Left ventriculography: EF 25-30%, global hypokinesis.   Saw Dr. Elyse Hsu at [facility] and felt not to be candidate for advanced therapies.  She returns for follow up with her sisters. Dobutamine stopped in September. Placed on Hospice.  Poor historian. Denies SOB/Orthopnea but her sisters  report dyspnea with all ADLs. Says she watches TV. Sisters report good days and bad days. Dyspnea with minimal exertion. Not able to do much.      ROS: All systems negative except as listed in HPI, PMH and Problem List.  SH: Smoker prior to hospitalization.  Lives with sisters.    FH: No premature CAD  Labs (7/14): K 5, creatinine 0.66, co-ox 71%          (8/14): K 4.1, creatinine 1.17          (12/12/12) K 3.1 Creatinine 0.81 Magnesium 1.4 Potassium increased  And Magnesium added 12/17/12          (9/14): K+ 5.2, creatinine 1.0   Past Medical History  Diagnosis Date  . Allergic rhinitis   . Bipolar disorder   . GERD (gastroesophageal reflux disease)   . Hypokalemia   . Hyperlipemia   . COPD (chronic obstructive pulmonary disease)   . Heart palpitations   . Anemia   . Cancer     breast  . 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  . Diabetes mellitus without complication   . Coronary artery disease   - Nonischemic cardiomyopathy on dobutamine - CAD  Current Outpatient Prescriptions  Medication Sig Dispense Refill  . acetaminophen (TYLENOL) 325 MG tablet Take 2 tablets (650 mg total) by mouth every 4 (four) hours as needed.      Marland Kitchen aspirin 81 MG chewable tablet Chew 1 tablet (81 mg total) by mouth daily.      Marland Kitchen  atorvastatin (LIPITOR) 40 MG tablet Take 40 mg by mouth at bedtime.      . digoxin (LANOXIN) 0.125 MG tablet Take 0.5 tablets (0.0625 mg total) by mouth daily.      Marland Kitchen docusate sodium (COLACE) 100 MG capsule Take 100 mg by mouth at bedtime.      . fluPHENAZine (PROLIXIN) 2.5 MG tablet Take 2.5 mg by mouth at bedtime.      . furosemide (LASIX) 40 MG tablet Take 1 tablet (40 mg total) by mouth daily.  30 tablet    . glipiZIDE (GLUCOTROL) 5 MG tablet Take 2 tablets (10 mg total) by mouth 2 (two) times daily before a meal.  120 tablet  3  . insulin detemir (LEVEMIR) 100 UNIT/ML injection Inject 30 Units into the skin daily.       Marland Kitchen lisinopril  (PRINIVIL,ZESTRIL) 2.5 MG tablet Take 1/2 tab in AM and 1 tab in PM  30 tablet  3  . loratadine (CLARITIN) 10 MG tablet Take 10 mg by mouth daily.      Marland Kitchen loxapine (LOXITANE) 10 MG capsule Take 10 mg by mouth at bedtime.      . Magnesium Oxide 400 MG CAPS Take 1 capsule (400 mg total) by mouth 2 (two) times daily.  60 capsule  3  . nortriptyline (PAMELOR) 50 MG capsule Take 100 mg by mouth at bedtime.       Marland Kitchen omeprazole (PRILOSEC) 20 MG capsule Take 20 mg by mouth daily.      . polyvinyl alcohol (LIQUIFILM TEARS) 1.4 % ophthalmic solution Place 1 drop into both eyes 2 (two) times daily as needed.  15 mL  0  . potassium chloride SA (K-DUR,KLOR-CON) 20 MEQ tablet Take 2 tablets (40 mEq total) by mouth 2 (two) times daily.  120 tablet  3  . Propylene Glycol (SYSTANE BALANCE) 0.6 % SOLN Apply 1 drop to eye 2 (two) times daily as needed (dry eyes).      . ramelteon (ROZEREM) 8 MG tablet Take 8 mg by mouth at bedtime as needed for sleep.       Marland Kitchen spironolactone (ALDACTONE) 25 MG tablet Take 12.5 mg by mouth daily.      . traZODone (DESYREL) 150 MG tablet Take 150 mg by mouth at bedtime.      . heparin lock flush 100 UNIT/ML SOLN 5 mLs (500 Units total) by Intracatheter route every 30 (thirty) days.       No current facility-administered medications for this encounter.    Filed Vitals:   01/29/13 1401  BP: 112/62  Pulse: 88  Weight: 130 lb 12 oz (59.308 kg)  SpO2: 100%    PHYSICAL EXAM: General: NAD No resp difficulty 4 sisters present  HEENT: normal  Neck: supple. JVP 5-6 cm Carotids 2+ bilat; no bruits. No lymphadenopathy or thryomegaly appreciated.  Cor: Regular rate & regular rhythm. +s3No S3/S4 Lungs: clear  Abdomen: soft, nontender, no distention. No hepatosplenomegaly. No bruits or masses. Good bowel sounds.  Extremities: no cyanosis, clubbing, rash, edema  Neuro: alert & orientedx3, cranial nerves grossly intact. moves all 4 extremities w/o difficulty. Flat affect   ASSESSMENT &  PLAN: 1. Chronic Systolic Heart Failure:  ECHO EF 20% with RV failure thought to be from Adriamycin toxicity. She is not a candidate for advanced therapies. She has transitioned to   [facility].   - No beta blocker due to low output. - Continue lisinopril 1.25/2.5 mg daily and spironolactone 12.5 mg daily.  2. CAD: See anatomy above.  Has significant CAD but do not think that it caused her cardiomyopathy.  If she recovers, may need to reassess in future.  Would consider IVUS of LM lesion for significance.  No ischemic symptoms.  Continue aspirin and statin.  - PCP manages   3. Smoking: Has abstained since January 2014 per patient and family today.    4. Schizophrenia: per primary care/psych - continue current meds  5.Breast Cancer:  Followed by Dr Jacalyn Lefevre. No further plans for radiation.    6. DM:  - Following up with PCP. Continue glipizide.   CLEGG,AMY NP-C 01/29/2013 2:10 PM  Patient seen and examined with Tonye Becket, NP. We discussed all aspects of the encounter. I agree with the assessment and plan as stated above. Remains stable on Hospice. Will continue current regimen. I told her that I thought day trips with her friend would be fine. Will see back in 6 weeks.   Truman Hayward 2:38 PM

## 2013-01-29 NOTE — Patient Instructions (Signed)
Follow up 4-6 weeks  Do the following things EVERYDAY: 1) Weigh yourself in the morning before breakfast. Write it down and keep it in a log. 2) Take your medicines as prescribed 3) Eat low salt foods-Limit salt (sodium) to 2000 mg per day.  4) Stay as active as you can everyday 5) Limit all fluids for the day to less than 2 liters

## 2013-02-09 ENCOUNTER — Other Ambulatory Visit: Payer: Self-pay | Admitting: *Deleted

## 2013-02-09 MED ORDER — FLUPHENAZINE HCL 2.5 MG PO TABS: 2.5000 mg | ORAL_TABLET | Freq: Every day | ORAL | Status: DC

## 2013-02-09 NOTE — Telephone Encounter (Signed)
rx refilled per protocol  

## 2013-02-19 ENCOUNTER — Other Ambulatory Visit: Payer: Self-pay

## 2013-03-16 ENCOUNTER — Telehealth (HOSPITAL_COMMUNITY): Payer: Self-pay | Admitting: Anesthesiology

## 2013-03-16 ENCOUNTER — Ambulatory Visit (HOSPITAL_COMMUNITY)
Admission: RE | Admit: 2013-03-16 | Discharge: 2013-03-16 | Disposition: A | Discharge status: Home / Self Care | Payer: Medicare Other | Source: Ambulatory Visit | Attending: Internal Medicine | Admitting: Internal Medicine

## 2013-03-16 VITALS — BP 84/52 | HR 116 | Wt 130.8 lb

## 2013-03-16 DIAGNOSIS — Z853 Personal history of malignant neoplasm of breast: Secondary | ICD-10-CM | POA: Insufficient documentation

## 2013-03-16 DIAGNOSIS — I509 Heart failure, unspecified: Secondary | ICD-10-CM

## 2013-03-16 DIAGNOSIS — J449 Chronic obstructive pulmonary disease, unspecified: Secondary | ICD-10-CM | POA: Insufficient documentation

## 2013-03-16 DIAGNOSIS — F209 Schizophrenia, unspecified: Secondary | ICD-10-CM | POA: Insufficient documentation

## 2013-03-16 DIAGNOSIS — I5022 Chronic systolic (congestive) heart failure: Secondary | ICD-10-CM | POA: Insufficient documentation

## 2013-03-16 DIAGNOSIS — Z8673 Personal history of transient ischemic attack (TIA), and cerebral infarction without residual deficits: Secondary | ICD-10-CM | POA: Insufficient documentation

## 2013-03-16 DIAGNOSIS — E119 Type 2 diabetes mellitus without complications: Secondary | ICD-10-CM | POA: Insufficient documentation

## 2013-03-16 DIAGNOSIS — I2510 Atherosclerotic heart disease of native coronary artery without angina pectoris: Secondary | ICD-10-CM | POA: Insufficient documentation

## 2013-03-16 DIAGNOSIS — E785 Hyperlipidemia, unspecified: Secondary | ICD-10-CM | POA: Insufficient documentation

## 2013-03-16 DIAGNOSIS — J4489 Other specified chronic obstructive pulmonary disease: Secondary | ICD-10-CM | POA: Insufficient documentation

## 2013-03-16 LAB — BASIC METABOLIC PANEL
CO2: 27 mEq/L (ref 19–32)
Calcium: 9.8 mg/dL (ref 8.4–10.5)
Creatinine, Ser: 1.33 mg/dL — ABNORMAL HIGH (ref 0.50–1.10)
GFR calc Af Amer: 49 mL/min — ABNORMAL LOW (ref >90)
GFR calc non Af Amer: 42 mL/min — ABNORMAL LOW (ref >90)
Sodium: 132 mEq/L — ABNORMAL LOW (ref 135–145)

## 2013-03-16 LAB — DIGOXIN LEVEL: Digoxin Level: 1.6 ng/mL (ref 0.8–2.0)

## 2013-03-16 MED ORDER — DIGOXIN 125 MCG PO TABS: 0.0625 mg | ORAL_TABLET | Freq: Every day | ORAL | Status: DC

## 2013-03-16 NOTE — Patient Instructions (Signed)
Decrease digoxin to 0.0625 mcg daily (1/2 tablet daily)  Follow up with Primary Care doctor about blood sugars.  Doing great. Have a wonderful Christmas.  Cut Mountain Dew back to 1/2 can twice a day.  Will call with lab results  Follow up 2-3 months  Do the following things EVERYDAY: 1) Weigh yourself in the morning before breakfast. Write it down and keep it in a log. 2) Take your medicines as prescribed 3) Eat low salt foods-Limit salt (sodium) to 2000 mg per day.  4) Stay as active as you can everyday 5) Limit all fluids for the day to less than 2 liters

## 2013-03-16 NOTE — Progress Notes (Signed)
Patient ID: [name], female   DOB: 18-Feb-1951, 62 y.o.   MRN: [id number]   Weight Range 130-132 pounds  Baseline proBNP   Dr. Juanetta Gosling (PCP)   HPI: [name] is a 62 y/o female with a PMHx of COPD, CVA, HLD, DM2, schizophrenia and R. Breast CA (triple negative) who is currently being treated with radiation and s/p chemotherapy (Adriamycin).   Admitted to [facility] 7/16 [facility] through 11/07/12 with new acute systolic heart failure thought to be from adriamycin. 10/29/12 ECHO: Reviewed echo from [facility] Showed EF 20% with global hypokinesis, moderate RV dysfunction, small pericardial effusion.  Had a cath as noted below milrinone was stopped and dobutamine begun given hypotension requiring norephinephrine.  Failed Dobutamine wean therefore she was discharged to [facility] on dobutamine. Difficulty titrating meds due to hypotension. Discharge weight 136 pounds.  As noted below, LHC showed 60-70% LM stenosis and 80% long RCA stenosis.  This coronary disease was not thought to be the culprit for her cardiomyopathy.   RHC/LHC 7/18 (on milrinone and norepinephrine) RA mean 18  RV 45/20  PA 41/21, mean 29  PCWP mean 21  LV 95/16  AO 109/67  Oxygen saturations:  PA 40%  AO 95%  Cardiac Output (Fick) 3.5  Cardiac Index (Fick) 2.05  Cardiac Output (Thermo) 2.9  Cardiac Index (Thermo) 1.7  SVR 1799  Coronary angiography:  Left mainstem: 60-70% distal left main stenosis.  Left anterior descending (LAD): Luminal irregularities in the LAD. 80% ostial stenosis small D1.  Left circumflex (LCx): Small vessel, no significant disease.  Right coronary artery (RCA): Long 80% mid RCA stenosis.  Left ventriculography: EF 25-30%, global hypokinesis.   Saw Dr. Elyse Hsu at [facility] and felt not to be candidate for advanced therapies.  She returns for follow up with her sisters. Doing ok. Hospice following. Denies CO, edema or orthopnea. + DOE with minimal exertion. Does not do much  activity just gets up from the living room to kitchen and bathroom. On our chart had glipizide 10 mg BID, potassium 40 meq BID and digoxin 0.0625, however patient taking glipizide 5 mg BID, potassium 20 meq BID and digoxin 0.125. Blood sugar checked 483    ROS: All systems negative except as listed in HPI, PMH and Problem List.  SH: Smoker prior to hospitalization.  Lives with sisters.    FH: No premature CAD  Labs (7/14): K 5, creatinine 0.66, co-ox 71%          (8/14): K 4.1, creatinine 1.17          (12/12/12) K 3.1 Creatinine 0.81 Magnesium 1.4 Potassium increased  And Magnesium added 12/17/12          (9/14): K+ 5.2, creatinine 1.0   Past Medical History  Diagnosis Date  . Allergic rhinitis   . Bipolar disorder   . GERD (gastroesophageal reflux disease)   . Hypokalemia   . Hyperlipemia   . COPD (chronic obstructive pulmonary disease)   . Heart palpitations   . Anemia   . Cancer     breast  . 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  . Diabetes mellitus without complication   . Coronary artery disease   - Nonischemic cardiomyopathy on dobutamine - CAD  Current Outpatient Prescriptions  Medication Sig Dispense Refill  . acetaminophen (TYLENOL) 325 MG tablet Take 2 tablets (650 mg total) by mouth every 4 (four) hours as needed.      Marland Kitchen  aspirin 81 MG chewable tablet Chew 1 tablet (81 mg total) by mouth daily.      Marland Kitchen atorvastatin (LIPITOR) 40 MG tablet Take 40 mg by mouth at bedtime.      . digoxin (LANOXIN) 0.125 MG tablet Take 0.125 mg by mouth daily.      Marland Kitchen docusate sodium (COLACE) 100 MG capsule Take 100 mg by mouth at bedtime.      . fluPHENAZine (PROLIXIN) 2.5 MG tablet Take 1 tablet (2.5 mg total) by mouth at bedtime.  30 tablet  5  . furosemide (LASIX) 40 MG tablet Take 1 tablet (40 mg total) by mouth daily.  30 tablet    . glipiZIDE (GLUCOTROL) 5 MG tablet Take 5 mg by mouth 2 (two) times daily before a meal.      . heparin lock flush  100 UNIT/ML SOLN 5 mLs (500 Units total) by Intracatheter route every 30 (thirty) days.      . insulin detemir (LEVEMIR) 100 UNIT/ML injection Inject 30 Units into the skin daily.       Marland Kitchen lisinopril (PRINIVIL,ZESTRIL) 2.5 MG tablet Take 1/2 tab in AM and 1/2 tab in PM      . loratadine (CLARITIN) 10 MG tablet Take 10 mg by mouth daily.      Marland Kitchen loxapine (LOXITANE) 10 MG capsule Take 10 mg by mouth at bedtime.      . Magnesium Oxide 400 MG CAPS Take 1 capsule (400 mg total) by mouth 2 (two) times daily.  60 capsule  3  . nortriptyline (PAMELOR) 50 MG capsule Take 50 mg by mouth at bedtime.       Marland Kitchen omeprazole (PRILOSEC) 20 MG capsule Take 20 mg by mouth daily.      . polyvinyl alcohol (LIQUIFILM TEARS) 1.4 % ophthalmic solution Place 1 drop into both eyes 2 (two) times daily as needed.  15 mL  0  . potassium chloride SA (K-DUR,KLOR-CON) 20 MEQ tablet Take 20 mEq by mouth 2 (two) times daily.      Marland Kitchen Propylene Glycol (SYSTANE BALANCE) 0.6 % SOLN Apply 1 drop to eye 2 (two) times daily as needed (dry eyes).      . ramelteon (ROZEREM) 8 MG tablet Take 8 mg by mouth at bedtime as needed for sleep.       Marland Kitchen spironolactone (ALDACTONE) 25 MG tablet Take 12.5 mg by mouth daily.      . traZODone (DESYREL) 150 MG tablet Take 150 mg by mouth at bedtime.       No current facility-administered medications for this encounter.    Filed Vitals:   03/16/13 1403  BP: 84/52  Pulse: 116  Weight: 130 lb 12 oz (59.308 kg)  SpO2: 91%    PHYSICAL EXAM: General: NAD No resp difficulty 2 sisters present  HEENT: normal  Neck: supple. JVP flat Carotids 2+ bilat; no bruits. No lymphadenopathy or thryomegaly appreciated.  Cor: Regular rate & regular rhythm. +s3 No S3/S4 Lungs: clear  Abdomen: soft, nontender, no distention. No hepatosplenomegaly. No bruits or masses. Good bowel sounds.  Extremities: no cyanosis, clubbing, rash, edema  Neuro: alert & orientedx3, cranial nerves grossly intact. moves all 4 extremities  w/o difficulty. Flat affect   ASSESSMENT & PLAN: 1. Chronic Systolic Heart Failure:  ECHO EF 20% with RV failure thought to be from Adriamycin toxicity. She is not a candidate for advanced therapies. She has transitioned to  [facility].  - Fluid status stable will continue lasix 40 mg  daily. Re-iterated the need to drink less than 2L a day. She likes mountain dew and have asked her to drink diet because of DM and to have only 1/2 can twice a day.  - No beta blocker due to low output and hypotension - cut digoxin back to 0.0625 mcg.  - Continue lisinopril 2.5 mg BID and spironolactone 12.5 mg daily.    - Reinforced the need and importance of daily weights, a low sodium diet, and fluid restriction (less than 2 L a day). Instructed to call the HF clinic if weight increases more than 3 lbs overnight or 5 lbs in a week.  - BMET and digoxin level today.  2. CAD: See anatomy above.  Has significant CAD but do not think that it caused her cardiomyopathy. No ischemic symptoms.  Continue aspirin and statin  3. Schizophrenia: per primary care/psych - continue current meds  4. DM:  - Need to follow up with PCP. Blood sugars extremely elevated need to follow up with PCP. Was previously on glipi  Kristine Rud NP-C 03/16/2013 2:21 PM  Patient seen and examined with Ulla Potash, NP. We discussed all aspects of the encounter. I agree with the assessment and plan as stated above. She has end-stage HF and has transitioned to [facility]. She is comfortable. Reviewed use of sliding scale diuretics to help with dyspnea and comfort. We will not make changes today.  Truman Hayward 6:13 PM

## 2013-03-16 NOTE — Telephone Encounter (Signed)
Called about labs from [facility]. Cr elevated 1.33, have asked the sisters who give the patient her medication to hold lasix 40 mg for 2 days and to hold her KCL for 2 days. Digoxin 1.6, cut digoxin back to 0.0625 mcg daily. They are aware of changes. Also instructed again to follow up with PCP about blood sugars.   Cr. 1.33, KCL 4.4, digoxin 1.6, Glucose 462  Ulla Potash B NP-C 5:06 PM

## 2013-04-03 ENCOUNTER — Other Ambulatory Visit (HOSPITAL_COMMUNITY): Payer: Self-pay | Admitting: Adult Health

## 2013-04-15 NOTE — Addendum Note (Signed)
Encounter addended by: Dolores Patty, MD on: 04/15/2013  6:14 PM<BR>     Documentation filed: Follow-up Section, LOS Section, Problem List, Notes Section

## 2013-04-15 NOTE — Addendum Note (Signed)
Encounter addended by: Dolores Patty, MD on: 04/15/2013  1:57 PM<BR>     Documentation filed: Visit Diagnoses

## 2013-04-20 ENCOUNTER — Other Ambulatory Visit (HOSPITAL_COMMUNITY): Payer: Self-pay | Admitting: Pulmonary Disease

## 2013-04-20 DIAGNOSIS — Z139 Encounter for screening, unspecified: Secondary | ICD-10-CM

## 2013-04-20 DIAGNOSIS — C50911 Malignant neoplasm of unspecified site of right female breast: Secondary | ICD-10-CM

## 2013-04-23 ENCOUNTER — Ambulatory Visit (HOSPITAL_COMMUNITY): Payer: Medicare HMO

## 2013-05-06 ENCOUNTER — Encounter (HOSPITAL_COMMUNITY): Payer: Medicare Other

## 2013-05-06 ENCOUNTER — Ambulatory Visit (HOSPITAL_COMMUNITY)
Admission: RE | Admit: 2013-05-06 | Discharge: 2013-05-06 | Disposition: A | Discharge status: Home / Self Care | Payer: Medicare HMO | Source: Ambulatory Visit | Attending: Pulmonary Disease | Admitting: Pulmonary Disease

## 2013-05-06 DIAGNOSIS — Z1231 Encounter for screening mammogram for malignant neoplasm of breast: Secondary | ICD-10-CM | POA: Insufficient documentation

## 2013-05-06 DIAGNOSIS — C50911 Malignant neoplasm of unspecified site of right female breast: Secondary | ICD-10-CM

## 2013-06-09 ENCOUNTER — Ambulatory Visit (INDEPENDENT_AMBULATORY_CARE_PROVIDER_SITE_OTHER): Payer: Medicare Other | Admitting: Internal Medicine

## 2013-06-18 ENCOUNTER — Telehealth (HOSPITAL_COMMUNITY): Payer: Self-pay | Admitting: Hematology and Oncology

## 2013-06-22 ENCOUNTER — Encounter (INDEPENDENT_AMBULATORY_CARE_PROVIDER_SITE_OTHER): Payer: Self-pay | Admitting: Internal Medicine

## 2013-06-22 ENCOUNTER — Ambulatory Visit (INDEPENDENT_AMBULATORY_CARE_PROVIDER_SITE_OTHER): Payer: Medicare HMO | Admitting: Internal Medicine

## 2013-06-22 ENCOUNTER — Other Ambulatory Visit (INDEPENDENT_AMBULATORY_CARE_PROVIDER_SITE_OTHER): Payer: Self-pay | Admitting: Internal Medicine

## 2013-06-22 VITALS — BP 90/52 | HR 104 | Temp 98.0°F | Ht 67.0 in | Wt 126.1 lb

## 2013-06-22 DIAGNOSIS — R197 Diarrhea, unspecified: Secondary | ICD-10-CM

## 2013-06-22 NOTE — Patient Instructions (Signed)
Cbc and cmet today.

## 2013-06-22 NOTE — Progress Notes (Signed)
Subjective:     Patient ID: [name], female   DOB: [birth year], 63 y.o.   MRN: [id number]  HPIPresents today with c/o that she has had diarrhea for about 3 months.  Her stools are very loose. Per patient, her stool was normal yesterday and today.  Her last loose stool was Tuesday or Wednesday of last week. Patient denies any abdominal pain She has not been on antibiotics recently. She is not taking the stool softener. She is taking Imodium prn. Per sister, she does not have diarrhea all the time.  Her sister states her diarrhea is much better since starting the Prolixin IM. She was diagnosed with breast cancer in 2013. She apparently underwent a lumpectomy by Dr. Arnoldo Morale. She did undergo chemo therapy. She started radiation but did not complete. Hx of schizophrenia and obtaining a history is very difficult. Her appetite is good. Her weight in November of 2013 was 140. Today her weight is 126.1. She denies any rectal bleeding or melena.  She was seen at the [facility] in [place].    Prolixin shots every 2 weeks.  03/02/2012 EGD/Colonoscopy: Bloating, fatty liver. Cecal Withdrawal Time: 7 minutes  Impression:  Small sliding hiatal hernia and bulbar duodenitis but no evidence of peptic ulcer.  4 polyps snared from proximal transverse colon and submitted together. Largest of these was 7-8 mm and others were 5 mm each Notes Recorded by Rogene Houston, MD on 03/05/2012 at 9:28 PM Results given to patient's sister over the weekend. H. pylori serology is negative Single small polyp is a tubular adenoma. Next colonoscopy in 10 years Report to PCP         11/19/2011 US abdomen: IMPRESSION:  1. Echogenic liver consistent with fatty infiltration. No focal  abnormality.  2. No gallstones.  3. The pancreas is obscured by bowel gas.  Original Report Authenticated By: Joretta Bachelor, M.D.       Review of Systems Past Medical History  Diagnosis Date  . Allergic rhinitis   . Bipolar  disorder   . GERD (gastroesophageal reflux disease)   . Hypokalemia   . Hyperlipemia   . COPD (chronic obstructive pulmonary disease)   . Heart palpitations   . Anemia   . Cancer     breast  . 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  . Diabetes mellitus without complication   . Coronary artery disease   . Breast cancer     rt breast. diagnosed in 2013    Past Surgical History  Procedure Laterality Date  . Sterilization    . Colonoscopy    . Upper gastrointestinal endoscopy    . Dilation and curettage of uterus    . Colonoscopy with esophagogastroduodenoscopy (egd)  02/21/2012    Procedure: COLONOSCOPY WITH ESOPHAGOGASTRODUODENOSCOPY (EGD);  Surgeon: Rogene Houston, MD;  Location: [facility] END[facility] SUITE;  Service: Endoscopy;  Laterality: N/A;  2:25  . Right breast lumpectomy      benign/Dr. Arnoldo Morale ?1990's  . Breast biopsy  2013    right breast  . Portacath placement  04/01/2012    Procedure: INSERTION PORT-A-CATH;  Surgeon: Scherry Ran, MD;  Location: [facility] ORS;  Service: General;  Laterality: Left;  Insertion Port-A-Cath Left Subclavian under Fluoroscopy    Allergies  Allergen Reactions  . Ibuprofen Nausea Only  . Other Other (See Comments)    Ragweed Pepper - Sneezing    Current Outpatient Prescriptions on File Prior to Visit  Medication Sig Dispense Refill  . acetaminophen (TYLENOL) 325 MG tablet Take 2 tablets (650 mg total) by mouth every 4 (four) hours as needed.      Marland Kitchen aspirin 81 MG chewable tablet Chew 1 tablet (81 mg total) by mouth daily.      Marland Kitchen atorvastatin (LIPITOR) 40 MG tablet Take 40 mg by mouth at bedtime.      . digoxin (LANOXIN) 0.125 MG tablet Take 0.5 tablets (0.0625 mg total) by mouth daily.  15 tablet  6  . docusate sodium (COLACE) 100 MG capsule Take 100 mg by mouth at bedtime.      . furosemide (LASIX) 40 MG tablet Take 1 tablet (40 mg total) by mouth daily.  30 tablet    . glipiZIDE (GLUCOTROL) 5 MG tablet Take 5  mg by mouth 2 (two) times daily before a meal.      . insulin detemir (LEVEMIR) 100 UNIT/ML injection Inject 40 Units into the skin daily.       Marland Kitchen lisinopril (PRINIVIL,ZESTRIL) 2.5 MG tablet Take 1/2 tab in AM and 1/2 tab in PM      . loratadine (CLARITIN) 10 MG tablet Take 10 mg by mouth daily.      Marland Kitchen loxapine (LOXITANE) 10 MG capsule Take 10 mg by mouth at bedtime.      . magnesium oxide (MAG-OX) 400 (241.3 MG) MG tablet TAKE (1) TABLET TWICE DAILY.  60 tablet  6  . nortriptyline (PAMELOR) 50 MG capsule Take 50 mg by mouth at bedtime.       Marland Kitchen omeprazole (PRILOSEC) 20 MG capsule Take 20 mg by mouth daily.      . polyvinyl alcohol (LIQUIFILM TEARS) 1.4 % ophthalmic solution Place 1 drop into both eyes 2 (two) times daily as needed.  15 mL  0  . potassium chloride SA (K-DUR,KLOR-CON) 20 MEQ tablet Take 20 mEq by mouth 2 (two) times daily.      Marland Kitchen Propylene Glycol (SYSTANE BALANCE) 0.6 % SOLN Apply 1 drop to eye 2 (two) times daily as needed (dry eyes).      . ramelteon (ROZEREM) 8 MG tablet Take 8 mg by mouth at bedtime as needed for sleep.       Marland Kitchen spironolactone (ALDACTONE) 25 MG tablet Take 12.5 mg by mouth daily.      . traZODone (DESYREL) 150 MG tablet Take 150 mg by mouth at bedtime.      . heparin lock flush 100 UNIT/ML SOLN 5 mLs (500 Units total) by Intracatheter route every 30 (thirty) days.       No current facility-administered medications on file prior to visit.        Objective:   Physical Exam  Filed Vitals:   06/22/13 1417  BP: 90/52  Pulse: 104  Temp: 98 F (36.7 C)  Height: 5\' 7"  (1.702 m)  Weight: 126 lb 1.6 oz (57.199 kg)   Alert and oriented. Skin warm and dry. Oral mucosa is moist.   . Sclera anicteric, conjunctivae is pink. Thyroid not enlarged. No cervical lymphadenopathy. Lungs clear. Heart regular rate and rhythm.  Abdomen is soft. Bowel sounds are positive. No hepatomegaly. No abdominal masses felt. No tenderness.  No edema to lower extremities. Patient is  alert and oriented.     Assessment:    diarrhea which has resolved. Sister in room states patient rarely has diarrhea now. No abdominal pain.     Plan:     CBC, CMET today.

## 2013-06-23 LAB — CBC WITH DIFFERENTIAL/PLATELET
BASOS PCT: 0 % (ref 0–1)
Basophils Absolute: 0 10*3/uL (ref 0.0–0.1)
Eosinophils Absolute: 0.1 10*3/uL (ref 0.0–0.7)
Eosinophils Relative: 1 % (ref 0–5)
HEMATOCRIT: 39 % (ref 36.0–46.0)
HEMOGLOBIN: 12.6 g/dL (ref 12.0–15.0)
LYMPHS ABS: 1.8 10*3/uL (ref 0.7–4.0)
Lymphocytes Relative: 22 % (ref 12–46)
MCH: 30.8 pg (ref 26.0–34.0)
MCHC: 32.3 g/dL (ref 30.0–36.0)
MCV: 95.4 fL (ref 78.0–100.0)
MONO ABS: 0.6 10*3/uL (ref 0.1–1.0)
MONOS PCT: 7 % (ref 3–12)
NEUTROS ABS: 5.7 10*3/uL (ref 1.7–7.7)
Neutrophils Relative %: 70 % (ref 43–77)
Platelets: 349 10*3/uL (ref 150–400)
RBC: 4.09 MIL/uL (ref 3.87–5.11)
RDW: 14.5 % (ref 11.5–15.5)
WBC: 8.1 10*3/uL (ref 4.0–10.5)

## 2013-06-23 LAB — COMPREHENSIVE METABOLIC PANEL
ALBUMIN: 4.3 g/dL (ref 3.5–5.2)
ALK PHOS: 106 U/L (ref 39–117)
ALT: 19 U/L (ref 0–35)
AST: 22 U/L (ref 0–37)
BUN: 12 mg/dL (ref 6–23)
CALCIUM: 10 mg/dL (ref 8.4–10.5)
CO2: 31 meq/L (ref 19–32)
Chloride: 97 mEq/L (ref 96–112)
Creat: 0.98 mg/dL (ref 0.50–1.10)
GLUCOSE: 133 mg/dL — AB (ref 70–99)
POTASSIUM: 4.4 meq/L (ref 3.5–5.3)
Sodium: 138 mEq/L (ref 135–145)
TOTAL PROTEIN: 7 g/dL (ref 6.0–8.3)
Total Bilirubin: 0.3 mg/dL (ref 0.2–1.2)

## 2013-07-03 ENCOUNTER — Encounter (HOSPITAL_COMMUNITY): Payer: Self-pay

## 2013-07-20 ENCOUNTER — Encounter (HOSPITAL_COMMUNITY): Payer: Self-pay

## 2013-07-20 ENCOUNTER — Ambulatory Visit (HOSPITAL_COMMUNITY)
Admission: RE | Admit: 2013-07-20 | Discharge: 2013-07-20 | Disposition: A | Discharge status: Home / Self Care | Payer: Medicare HMO | Source: Ambulatory Visit | Attending: Internal Medicine | Admitting: Internal Medicine

## 2013-07-20 VITALS — BP 112/71 | HR 110 | Resp 18 | Wt 126.0 lb

## 2013-07-20 DIAGNOSIS — E119 Type 2 diabetes mellitus without complications: Secondary | ICD-10-CM | POA: Diagnosis not present

## 2013-07-20 DIAGNOSIS — F209 Schizophrenia, unspecified: Secondary | ICD-10-CM

## 2013-07-20 DIAGNOSIS — I2510 Atherosclerotic heart disease of native coronary artery without angina pectoris: Secondary | ICD-10-CM

## 2013-07-20 DIAGNOSIS — I5022 Chronic systolic (congestive) heart failure: Secondary | ICD-10-CM | POA: Diagnosis present

## 2013-07-20 DIAGNOSIS — I509 Heart failure, unspecified: Secondary | ICD-10-CM

## 2013-07-20 LAB — DIGOXIN LEVEL: DIGOXIN LVL: 0.8 ng/mL (ref 0.8–2.0)

## 2013-07-20 MED ORDER — SPIRONOLACTONE 25 MG PO TABS: 25.0000 mg | ORAL_TABLET | Freq: Every day | ORAL | Status: DC

## 2013-07-20 MED ORDER — POTASSIUM CHLORIDE CRYS ER 20 MEQ PO TBCR: 20.0000 meq | EXTENDED_RELEASE_TABLET | Freq: Every day | ORAL | Status: DC

## 2013-07-20 NOTE — Patient Instructions (Signed)
Doing great.  Will follow up in 3 months.  Call any issues [phone]  Do the following things EVERYDAY: 1) Weigh yourself in the morning before breakfast. Write it down and keep it in a log. 2) Take your medicines as prescribed 3) Eat low salt foods-Limit salt (sodium) to 2000 mg per day.  4) Stay as active as you can everyday 5) Limit all fluids for the day to less than 2 liters 6)

## 2013-07-20 NOTE — Progress Notes (Signed)
Patient ID: [name], female   DOB: 05-25-1950, 63 y.o.   MRN: [id number]   Weight Range 130-132 pounds  Baseline proBNP   Dr. Luan Pulling (PCP)   HPI: [name] is a 63 y/o female with a PMHx of COPD, CVA, HLD, DM2, schizophrenia and R. Breast CA (triple negative) who is currently being treated with radiation and s/p chemotherapy (Adriamycin).   Admitted to [facility] 7/16 [place] through 11/07/12 with new acute systolic heart failure thought to be from adriamycin. 10/29/12 ECHO: Reviewed echo from [facility]. Showed EF 20% with global hypokinesis, moderate RV dysfunction, small pericardial effusion.  Had a cath as noted below milrinone was stopped and dobutamine begun given hypotension requiring norephinephrine.  Failed Dobutamine wean therefore she was discharged to [facility] on dobutamine. Difficulty titrating meds due to hypotension. Discharge weight 136 pounds.  As noted below, LHC showed 60-70% LM stenosis and 80% long RCA stenosis.  This coronary disease was not thought to be the culprit for her cardiomyopathy.   RHC/LHC 10/31/2012 (on milrinone and norepinephrine) RA mean 18  RV 45/20  PA 41/21, mean 29  PCWP mean 21  LV 95/16  AO 109/67  Oxygen saturations:  PA 40%  AO 95%  Cardiac Output (Fick) 3.5  Cardiac Index (Fick) 2.05  Cardiac Output (Thermo) 2.9  Cardiac Index (Thermo) 1.7  SVR 1799  Coronary angiography:  Left mainstem: 60-70% distal left main stenosis.  Left anterior descending (LAD): Luminal irregularities in the LAD. 80% ostial stenosis small D1.  Left circumflex (LCx): Small vessel, no significant disease.  Right coronary artery (RCA): Long 80% mid RCA stenosis.  Left ventriculography: EF 25-30%, global hypokinesis.   Saw Dr. Katy Apo at [facility] and felt not to be candidate for advanced therapies.  Follow up: Since last visit Cr elevated 1.33 and she was told tohold lasix 40 mg for 2 days and to hold her KCL for 2 days. Digoxin 1.6, cut  digoxin back to 0.0625 mcg daily. She is followed by [facility]. Doing well. Denies CP, orthopnea, PND or heart palpitations. Sister helps provide information. +DOE with minimal exertion about 37ft. Does not do much activity. Blood sugars more controlled 80-180s. Discharged from [place] in March and now followed by [facility]. Weight at home 122-125 lbs and following low salt diet and drinking less than 2L a day. Denies dizziness.      ROS: All systems negative except as listed in HPI, PMH and Problem List.  SH: Smoker prior to hospitalization.  Lives with sisters.   FH: No premature CAD  Labs (7/14): K 5, creatinine 0.66, co-ox 71%          (8/14): K 4.1, creatinine 1.17          (12/12/12) K 3.1 Creatinine 0.81 Magnesium 1.4 Potassium increased  And Magnesium added 12/17/12          (9/14): K+ 5.2, creatinine 1.0         (12/14): Dig level 1.6, K+ 4.4, Cr 1.33       (06/2013): K+ 4.4, Cr 0.98   Past Medical History  Diagnosis Date  . Allergic rhinitis   . Bipolar disorder   . GERD (gastroesophageal reflux disease)   . Hypokalemia   . Hyperlipemia   . COPD (chronic obstructive pulmonary disease)   . Heart palpitations   . Anemia   . Cancer     breast  . 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  . Diabetes mellitus without complication   . Coronary artery disease   . Breast cancer     rt breast. diagnosed in 2013  - Nonischemic cardiomyopathy on dobutamine - CAD  Current Outpatient Prescriptions  Medication Sig Dispense Refill  . acetaminophen (TYLENOL) 325 MG tablet Take 2 tablets (650 mg total) by mouth every 4 (four) hours as needed.      Marland Kitchen aspirin 81 MG chewable tablet Chew 1 tablet (81 mg total) by mouth daily.      Marland Kitchen atorvastatin (LIPITOR) 40 MG tablet Take 40 mg by mouth at bedtime.      . benzonatate (TESSALON) 100 MG capsule Take by mouth 3 (three) times daily as needed for cough.      . denosumab (PROLIA) 60 MG/ML SOLN  injection Inject 60 mg into the skin every 6 (six) months. Administer in upper arm, thigh, or abdomen      . digoxin (LANOXIN) 0.125 MG tablet Take 0.5 tablets (0.0625 mg total) by mouth daily.  15 tablet  6  . docusate sodium (COLACE) 100 MG capsule Take 100 mg by mouth at bedtime.      . fluPHENAZine decanoate (PROLIXIN) 25 MG/ML injection Inject 25 mg into the muscle every 14 (fourteen) days.      . furosemide (LASIX) 40 MG tablet Take 1 tablet (40 mg total) by mouth daily.  30 tablet    . glipiZIDE (GLUCOTROL) 5 MG tablet Take 5 mg by mouth 2 (two) times daily before a meal.      . heparin lock flush 100 UNIT/ML SOLN 5 mLs (500 Units total) by Intracatheter route every 30 (thirty) days.      . insulin detemir (LEVEMIR) 100 UNIT/ML injection Inject 40 Units into the skin daily.       Marland Kitchen lisinopril (PRINIVIL,ZESTRIL) 2.5 MG tablet Take 1/2 tab in AM and 1/2 tab in PM      . loratadine (CLARITIN) 10 MG tablet Take 10 mg by mouth daily.      Marland Kitchen loxapine (LOXITANE) 10 MG capsule Take 10 mg by mouth at bedtime.      . magnesium oxide (MAG-OX) 400 (241.3 MG) MG tablet TAKE (1) TABLET TWICE DAILY.  60 tablet  6  . nortriptyline (PAMELOR) 50 MG capsule Take 50 mg by mouth at bedtime.       Marland Kitchen omeprazole (PRILOSEC) 20 MG capsule Take 20 mg by mouth daily.      . polyvinyl alcohol (LIQUIFILM TEARS) 1.4 % ophthalmic solution Place 1 drop into both eyes 2 (two) times daily as needed.  15 mL  0  . potassium chloride SA (K-DUR,KLOR-CON) 20 MEQ tablet Take 20 mEq by mouth 2 (two) times daily.      Marland Kitchen Propylene Glycol (SYSTANE BALANCE) 0.6 % SOLN Apply 1 drop to eye 2 (two) times daily as needed (dry eyes).      . ramelteon (ROZEREM) 8 MG tablet Take 8 mg by mouth at bedtime as needed for sleep.       Marland Kitchen spironolactone (ALDACTONE) 25 MG tablet Take 12.5 mg by mouth daily.      . temazepam (RESTORIL) 15 MG capsule Take 15 mg by mouth at bedtime as needed for sleep.      . traZODone (DESYREL) 150 MG tablet Take 150  mg by mouth at bedtime.       No current facility-administered medications for this encounter.    Filed Vitals:   07/20/13 1404  BP: 112/71  Pulse: 110  Resp: 18  Weight: 126 lb (57.153 kg)  SpO2: 97%    PHYSICAL EXAM: General: NAD No resp difficulty 2 sisters present  HEENT: normal  Neck: supple. JVP flat Carotids 2+ bilat; no bruits. No lymphadenopathy or thryomegaly appreciated.  Cor: Regular rate & regular rhythm. +s3 No S3/S4 Lungs: clear  Abdomen: soft, nontender, no distention. No hepatosplenomegaly. No bruits or masses. Good bowel sounds.  Extremities: no cyanosis, clubbing, rash, edema  Neuro: alert & orientedx3, cranial nerves grossly intact. moves all 4 extremities w/o difficulty. Flat affect   ASSESSMENT & PLAN: 1. Chronic Systolic Heart Failure:  ECHO EF 20% with RV failure thought to be from Adriamycin toxicity (ECHO was done at [facility] and not in system) She is not a candidate for advanced therapies d/t psych issues. She was in Hospice, but recently was discharged. - NYHA III symptoms and volume status stable. Will continue lasix 40 mg daily. - No beta blocker due to low output and hypotension - Continue digoxin 0.0625 daily and lisinopril 1.125 mg BID. Check dig level today. - Family very supportive, however very difficult situation because they are aging as well and difficult for them to care for her as well. Her medications are prepackaged and will be too much confusion for sisters and patient to change so will call [place] and next refill will change spiro to 25 mg daily and cut KCL back to 20 meq daily. - ECHO next visit - Reinforced the need and importance of daily weights, a low sodium diet, and fluid restriction (less than 2 L a day). Instructed to call the HF clinic if weight increases more than 3 lbs overnight or 5 lbs in a week.  - BMET and digoxin level today.  2. CAD: See anatomy above.  Has significant CAD but do not think that it caused her  cardiomyopathy. No ischemic symptoms.  Continue aspirin and statin  3. Schizophrenia: per primary care/psych - continue current meds  4. DM:  - Blood sugars more controlled. Managed by PCP.    F/U 3 months Rande Brunt NP-C 07/20/2013 2:24 PM

## 2013-07-22 ENCOUNTER — Encounter (HOSPITAL_COMMUNITY)
Admission: RE | Admit: 2013-07-22 | Discharge: 2013-07-22 | Disposition: A | Discharge status: Home / Self Care | Payer: Medicare HMO | Source: Ambulatory Visit | Attending: Pulmonary Disease | Admitting: Pulmonary Disease

## 2013-07-22 ENCOUNTER — Other Ambulatory Visit (HOSPITAL_COMMUNITY): Payer: Self-pay | Admitting: Pulmonary Disease

## 2013-07-22 DIAGNOSIS — Z452 Encounter for adjustment and management of vascular access device: Secondary | ICD-10-CM | POA: Insufficient documentation

## 2013-07-22 DIAGNOSIS — N6310 Unspecified lump in the right breast, unspecified quadrant: Secondary | ICD-10-CM

## 2013-07-22 MED ORDER — SODIUM CHLORIDE 0.9 % IJ SOLN
10.0000 mL | INTRAMUSCULAR | Status: AC | PRN
  Administered 2013-07-22: 10 mL

## 2013-07-22 MED ORDER — HEPARIN SOD (PORK) LOCK FLUSH 100 UNIT/ML IV SOLN
500.0000 [IU] | INTRAVENOUS | Status: AC | PRN
  Administered 2013-07-22: 500 [IU]

## 2013-07-22 NOTE — Progress Notes (Signed)
Here from Dr Luan Pulling office for port flush. Left chest porta cath in place and flushed with saline and heparin. Good blood return. Tolerated well.

## 2013-07-23 ENCOUNTER — Encounter (HOSPITAL_COMMUNITY): Payer: Self-pay

## 2013-07-27 ENCOUNTER — Telehealth (HOSPITAL_COMMUNITY): Payer: Self-pay | Admitting: Cardiology

## 2013-07-27 ENCOUNTER — Other Ambulatory Visit (HOSPITAL_COMMUNITY): Payer: Self-pay

## 2013-07-27 MED ORDER — SPIRONOLACTONE 25 MG PO TABS: 25.0000 mg | ORAL_TABLET | Freq: Every day | ORAL | Status: DC

## 2013-07-27 NOTE — Telephone Encounter (Signed)
Rx verified for current dose of spiro per Junie Bame 25mg  daily

## 2013-07-27 NOTE — Telephone Encounter (Signed)
Please clarify spiro dose [name] has pt taking 25mg  daily, however [name] has pt taking 1/2 tab daily  Please clarify dose

## 2013-08-05 ENCOUNTER — Other Ambulatory Visit (HOSPITAL_COMMUNITY): Payer: Self-pay | Admitting: Hematology and Oncology

## 2013-08-05 ENCOUNTER — Ambulatory Visit (HOSPITAL_COMMUNITY)
Admission: RE | Admit: 2013-08-05 | Discharge: 2013-08-05 | Disposition: A | Discharge status: Home / Self Care | Payer: Medicare HMO | Source: Ambulatory Visit | Attending: Pulmonary Disease | Admitting: Pulmonary Disease

## 2013-08-05 ENCOUNTER — Ambulatory Visit (HOSPITAL_COMMUNITY)
Admission: RE | Admit: 2013-08-05 | Discharge: 2013-08-05 | Disposition: A | Discharge status: Home / Self Care | Payer: Medicare HMO | Source: Ambulatory Visit | Attending: Hematology and Oncology | Admitting: Hematology and Oncology

## 2013-08-05 DIAGNOSIS — C50911 Malignant neoplasm of unspecified site of right female breast: Secondary | ICD-10-CM

## 2013-08-05 DIAGNOSIS — C50919 Malignant neoplasm of unspecified site of unspecified female breast: Secondary | ICD-10-CM | POA: Diagnosis present

## 2013-08-05 DIAGNOSIS — N6310 Unspecified lump in the right breast, unspecified quadrant: Secondary | ICD-10-CM

## 2013-08-05 DIAGNOSIS — C50319 Malignant neoplasm of lower-inner quadrant of unspecified female breast: Secondary | ICD-10-CM | POA: Diagnosis not present

## 2013-09-02 ENCOUNTER — Encounter (HOSPITAL_COMMUNITY)
Admission: RE | Admit: 2013-09-02 | Discharge: 2013-09-02 | Disposition: A | Discharge status: Home / Self Care | Payer: Medicare HMO | Source: Ambulatory Visit | Attending: Pulmonary Disease | Admitting: Pulmonary Disease

## 2013-09-02 NOTE — Progress Notes (Signed)
Family member called to cancel today's apt for port flush. States pt is at [facility]. Refused to make next apt.

## 2013-10-08 ENCOUNTER — Other Ambulatory Visit (HOSPITAL_COMMUNITY): Payer: Self-pay | Admitting: *Deleted

## 2013-10-08 MED ORDER — DIGOXIN 125 MCG PO TABS: 0.0625 mg | ORAL_TABLET | Freq: Every day | ORAL | Status: AC

## 2013-10-19 ENCOUNTER — Encounter (HOSPITAL_COMMUNITY): Payer: Medicaid Other

## 2013-10-19 ENCOUNTER — Ambulatory Visit (HOSPITAL_COMMUNITY): Payer: Medicare HMO

## 2013-10-30 ENCOUNTER — Telehealth (HOSPITAL_COMMUNITY): Payer: Self-pay | Admitting: Adult Health

## 2013-10-30 ENCOUNTER — Ambulatory Visit (HOSPITAL_BASED_OUTPATIENT_CLINIC_OR_DEPARTMENT_OTHER)
Admission: RE | Admit: 2013-10-30 | Discharge: 2013-10-30 | Disposition: A | Discharge status: Home / Self Care | Payer: Medicare HMO | Source: Ambulatory Visit | Attending: Cardiology | Admitting: Cardiology

## 2013-10-30 ENCOUNTER — Ambulatory Visit (HOSPITAL_COMMUNITY)
Admission: RE | Admit: 2013-10-30 | Discharge: 2013-10-30 | Disposition: A | Discharge status: Home / Self Care | Payer: Medicare HMO | Source: Ambulatory Visit | Attending: Cardiology | Admitting: Cardiology

## 2013-10-30 ENCOUNTER — Other Ambulatory Visit: Payer: Self-pay | Admitting: Internal Medicine

## 2013-10-30 VITALS — BP 86/60 | HR 101 | Wt 127.8 lb

## 2013-10-30 DIAGNOSIS — I5022 Chronic systolic (congestive) heart failure: Secondary | ICD-10-CM | POA: Insufficient documentation

## 2013-10-30 DIAGNOSIS — J449 Chronic obstructive pulmonary disease, unspecified: Secondary | ICD-10-CM | POA: Diagnosis not present

## 2013-10-30 DIAGNOSIS — E785 Hyperlipidemia, unspecified: Secondary | ICD-10-CM | POA: Diagnosis not present

## 2013-10-30 DIAGNOSIS — J4489 Other specified chronic obstructive pulmonary disease: Secondary | ICD-10-CM | POA: Insufficient documentation

## 2013-10-30 DIAGNOSIS — F17200 Nicotine dependence, unspecified, uncomplicated: Secondary | ICD-10-CM | POA: Diagnosis not present

## 2013-10-30 DIAGNOSIS — I2510 Atherosclerotic heart disease of native coronary artery without angina pectoris: Secondary | ICD-10-CM

## 2013-10-30 DIAGNOSIS — I509 Heart failure, unspecified: Secondary | ICD-10-CM | POA: Insufficient documentation

## 2013-10-30 DIAGNOSIS — I079 Rheumatic tricuspid valve disease, unspecified: Secondary | ICD-10-CM | POA: Insufficient documentation

## 2013-10-30 DIAGNOSIS — I059 Rheumatic mitral valve disease, unspecified: Secondary | ICD-10-CM | POA: Insufficient documentation

## 2013-10-30 DIAGNOSIS — E119 Type 2 diabetes mellitus without complications: Secondary | ICD-10-CM | POA: Diagnosis not present

## 2013-10-30 LAB — BASIC METABOLIC PANEL
Anion gap: 15 (ref 5–15)
BUN: 20 mg/dL (ref 6–23)
CHLORIDE: 96 meq/L (ref 96–112)
CO2: 26 meq/L (ref 19–32)
CREATININE: 1.23 mg/dL — AB (ref 0.50–1.10)
Calcium: 10.1 mg/dL (ref 8.4–10.5)
GFR calc Af Amer: 53 mL/min — ABNORMAL LOW (ref >90)
GFR calc non Af Amer: 46 mL/min — ABNORMAL LOW (ref >90)
GLUCOSE: 172 mg/dL — AB (ref 70–99)
Potassium: 4.9 mEq/L (ref 3.7–5.3)
Sodium: 137 mEq/L (ref 137–147)

## 2013-10-30 LAB — PRO B NATRIURETIC PEPTIDE: Pro B Natriuretic peptide (BNP): 206.8 pg/mL — ABNORMAL HIGH (ref 0–125)

## 2013-10-30 NOTE — Telephone Encounter (Signed)
Renal Function stable.   She verbalized understanding and appreciated the call.   Candie Gintz NP-C  4:13 PM

## 2013-10-30 NOTE — Progress Notes (Signed)
Patient ID: [name], female   DOB: August 13, 1950, 63 y.o.   MRN: [id number]   Weight Range 130-132 pounds  Baseline proBNP   Dr. Luan Pulling (PCP)   HPI: [name] is a 63 y/o female with a PMHx of COPD, CVA, HLD, DM2, schizophrenia and R. Breast CA (triple negative) who is currently being treated with radiation and s/p chemotherapy (Adriamycin).   Admitted to [facility] 7/[address] through 11/07/12 with new acute systolic heart failure thought to be from adriamycin. 10/29/12 ECHO: Reviewed echo from [facility]. Showed EF 20% with global hypokinesis, moderate RV dysfunction, small pericardial effusion.  Had a cath as noted below milrinone was stopped and dobutamine begun given hypotension requiring norephinephrine.  Failed Dobutamine wean therefore she was discharged to [facility] on dobutamine. Difficulty titrating meds due to hypotension. Discharge weight 136 pounds.  As noted below, LHC showed 60-70% LM stenosis and 80% long RCA stenosis.  This coronary disease was not thought to be the culprit for her cardiomyopathy.   RHC/LHC 10/31/2012 (on milrinone and norepinephrine) RA mean 18  RV 45/20  PA 41/21, mean 29  PCWP mean 21  LV 95/16  AO 109/67  Oxygen saturations:  PA 40%  AO 95%  Cardiac Output (Fick) 3.5  Cardiac Index (Fick) 2.05  Cardiac Output (Thermo) 2.9  Cardiac Index (Thermo) 1.7  SVR 1799  Coronary angiography:  Left mainstem: 60-70% distal left main stenosis.  Left anterior descending (LAD): Luminal irregularities in the LAD. 80% ostial stenosis small D1.  Left circumflex (LCx): Small vessel, no significant disease.  Right coronary artery (RCA): Long 80% mid RCA stenosis.  Left ventriculography: EF 25-30%, global hypokinesis.   Saw Dr. Katy Apo at [facility] and felt not to be candidate for advanced therapies.  Follow up: Since last visit she was admitted to [facility] for pain which was thought to be from R breast cancer. She was evaluated by [facility] in June for a third opinion with no new recommendations. Denies SOB/PND/Orthopnea. Does admit to dyspnea with steps. Weight at home 126 pounds. Followed by [facility].   Echo (7/15) with EF 35-40%, mild mitral valve prolapse with trivial MR, normal RV size and systolic function.  ROS: All systems negative except as listed in HPI, PMH and Problem List.  SH: Smoker prior to hospitalization.  Lives with sisters.    FH: No premature CAD  Labs (7/14): K 5, creatinine 0.66, co-ox 71%          (8/14): K 4.1, creatinine 1.17          (12/12/12) K 3.1 Creatinine 0.81 Magnesium 1.4 Potassium increased  And Magnesium added 12/17/12          (9/14): K+ 5.2, creatinine 1.0         (12/14): Dig level 1.6, K+ 4.4, Cr 1.33       (06/2013): K+ 4.4, Cr 0.98        (07/20/13) Dig level 0.8    Past Medical History  Diagnosis Date  . Allergic rhinitis   . Bipolar disorder   . GERD (gastroesophageal reflux disease)   . Hypokalemia   . Hyperlipemia   . COPD (chronic obstructive pulmonary disease)   . Heart palpitations   . Anemia   . Cancer     breast  . 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  . Diabetes mellitus without complication   . Coronary artery disease   .  Breast cancer     rt breast. diagnosed in 2013  - Nonischemic cardiomyopathy on dobutamine - CAD  Current Outpatient Prescriptions  Medication Sig Dispense Refill  . aspirin 81 MG chewable tablet Chew 1 tablet (81 mg total) by mouth daily.      Marland Kitchen atorvastatin (LIPITOR) 40 MG tablet Take 40 mg by mouth at bedtime.      . benzonatate (TESSALON) 100 MG capsule Take by mouth 3 (three) times daily as needed for cough.      . denosumab (PROLIA) 60 MG/ML SOLN injection Inject 60 mg into the skin every 6 (six) months. Administer in upper arm, thigh, or abdomen      . digoxin (LANOXIN) 0.125 MG tablet Take 0.5 tablets (0.0625 mg total) by mouth daily.  15 tablet  6  . docusate sodium (COLACE) 100 MG  capsule Take 100 mg by mouth at bedtime.      . fluPHENAZine decanoate (PROLIXIN) 25 MG/ML injection Inject 25 mg into the muscle every 14 (fourteen) days.      . furosemide (LASIX) 40 MG tablet Take 1 tablet (40 mg total) by mouth daily.  30 tablet    . glipiZIDE (GLUCOTROL) 5 MG tablet Take 5 mg by mouth 2 (two) times daily before a meal.      . heparin lock flush 100 UNIT/ML SOLN 5 mLs (500 Units total) by Intracatheter route every 30 (thirty) days.      . insulin detemir (LEVEMIR) 100 UNIT/ML injection Inject 40 Units into the skin daily.       Marland Kitchen lisinopril (PRINIVIL,ZESTRIL) 2.5 MG tablet Take 1/2 tab in AM and 1/2 tab in PM      . loratadine (CLARITIN) 10 MG tablet Take 10 mg by mouth daily.      Marland Kitchen loxapine (LOXITANE) 10 MG capsule Take 10 mg by mouth at bedtime.      . magnesium oxide (MAG-OX) 400 (241.3 MG) MG tablet TAKE (1) TABLET TWICE DAILY.  60 tablet  6  . nortriptyline (PAMELOR) 50 MG capsule Take 50 mg by mouth at bedtime.       Marland Kitchen omeprazole (PRILOSEC) 20 MG capsule Take 20 mg by mouth daily.      . polyvinyl alcohol (LIQUIFILM TEARS) 1.4 % ophthalmic solution Place 1 drop into both eyes 2 (two) times daily as needed.  15 mL  0  . potassium chloride SA (K-DUR,KLOR-CON) 20 MEQ tablet Take 1 tablet (20 mEq total) by mouth daily.  30 tablet  3  . Propylene Glycol (SYSTANE BALANCE) 0.6 % SOLN Apply 1 drop to eye 2 (two) times daily as needed (dry eyes).      . ramelteon (ROZEREM) 8 MG tablet Take 8 mg by mouth at bedtime as needed for sleep.       Marland Kitchen spironolactone (ALDACTONE) 25 MG tablet Take 1 tablet (25 mg total) by mouth daily.  30 tablet  3  . temazepam (RESTORIL) 15 MG capsule Take 15 mg by mouth at bedtime as needed for sleep.      . traZODone (DESYREL) 150 MG tablet Take 150 mg by mouth at bedtime.       No current facility-administered medications for this encounter.    Filed Vitals:   10/30/13 1145  BP: 86/60  Pulse: 101  Weight: 127 lb 12.8 oz (57.97 kg)  SpO2: 97%     PHYSICAL EXAM: General: NAD No resp difficulty 2 sisters present  HEENT: normal  Neck: supple. JVP flat Carotids 2+ bilat;  no bruits. No lymphadenopathy or thryomegaly appreciated.  Cor: Regular rate & regular rhythm. No S3/S4 Lungs: clear  Abdomen: soft, nontender, no distention. No hepatosplenomegaly. No bruits or masses. Good bowel sounds.  Extremities: no cyanosis, clubbing, rash, edema  Neuro: alert & orientedx3, cranial nerves grossly intact. moves all 4 extremities w/o difficulty. Flat affect  ASSESSMENT & PLAN: 1. Chronic Systolic Heart Failure:  ECHO EF 20% with RV failure thought to be from Adriamycin toxicity in 2014. Dr Aundra Dubin reviewed and discussed ECHO today: EF 35-40%. She is not a candidate for advanced therapies due to psychiatric issues and breast cancer. NYHA II symptoms and volume status stable.  - Will continue lasix 40 mg daily. - No beta blocker due to marginal blood pressure. - Continue digoxin 0.0625 daily (dig level 0.8 on 07/2013), spironolactone, and lisinopril 1.125 mg BID (will not titrate due to marginal BP).  -  Reinforced the need and importance of daily weights, a low sodium diet, and fluid restriction (less than 2 L a day). Instructed to call the HF clinic if weight increases more than 3 lbs overnight or 5 lbs in a week.  2. CAD: See anatomy above.  Has significant CAD but do not think that it caused her cardiomyopathy. No ischemic symptoms. Continue aspirin and statin.  I will try to arrange for cardiac rehab.  3. Schizophrenia: per primary care/psych - continue current meds  4. DM: Blood sugars more controlled. Managed by PCP.  5. R breast cancer: Per Dr Tressie Stalker   Follow up in 3-4 months    CLEGG,AMY NP-C 10/30/2013 11:51 AM  Patient seen with NP.  I reviewed her echo today, EF 35-40%.  This is improved compared to initial diagnosis.  Continue current therapy as above, no room for titration due to low blood pressure.  Stable on current Lasix  dose.    Loralie Champagne 11/01/2013

## 2013-10-30 NOTE — Patient Instructions (Signed)
Follow up in 3-4 months  Do the following things EVERYDAY: 1) Weigh yourself in the morning before breakfast. Write it down and keep it in a log. 2) Take your medicines as prescribed 3) Eat low salt foods-Limit salt (sodium) to 2000 mg per day.  4) Stay as active as you can everyday 5) Limit all fluids for the day to less than 2 liters 

## 2013-11-04 ENCOUNTER — Other Ambulatory Visit (HOSPITAL_COMMUNITY): Payer: Self-pay | Admitting: Cardiology

## 2013-11-04 DIAGNOSIS — I5022 Chronic systolic (congestive) heart failure: Secondary | ICD-10-CM

## 2013-11-04 MED ORDER — SPIRONOLACTONE 25 MG PO TABS: 25.0000 mg | ORAL_TABLET | Freq: Every day | ORAL | Status: DC

## 2013-12-02 ENCOUNTER — Other Ambulatory Visit (HOSPITAL_COMMUNITY): Payer: Self-pay | Admitting: Cardiology

## 2013-12-02 DIAGNOSIS — I5022 Chronic systolic (congestive) heart failure: Secondary | ICD-10-CM

## 2013-12-02 MED ORDER — SPIRONOLACTONE 25 MG PO TABS: 25.0000 mg | ORAL_TABLET | Freq: Every day | ORAL | Status: AC

## 2013-12-02 MED ORDER — POTASSIUM CHLORIDE CRYS ER 20 MEQ PO TBCR: 20.0000 meq | EXTENDED_RELEASE_TABLET | Freq: Every day | ORAL | Status: AC

## 2013-12-15 ENCOUNTER — Other Ambulatory Visit (HOSPITAL_COMMUNITY): Payer: Self-pay | Admitting: Oncology

## 2013-12-15 DIAGNOSIS — C50911 Malignant neoplasm of unspecified site of right female breast: Secondary | ICD-10-CM

## 2013-12-23 ENCOUNTER — Ambulatory Visit (HOSPITAL_COMMUNITY): Payer: Medicare HMO

## 2013-12-25 ENCOUNTER — Encounter (HOSPITAL_COMMUNITY): Payer: Self-pay | Admitting: Emergency Medicine

## 2013-12-25 ENCOUNTER — Inpatient Hospital Stay (HOSPITAL_COMMUNITY)
Admission: EM | Admit: 2013-12-25 | Discharge: 2013-12-27 | DRG: 204 | Disposition: A | Discharge status: Home / Self Care | Payer: Medicare HMO | Attending: Pulmonary Disease | Admitting: Pulmonary Disease

## 2013-12-25 ENCOUNTER — Emergency Department (HOSPITAL_COMMUNITY): Payer: Medicare HMO

## 2013-12-25 DIAGNOSIS — J4489 Other specified chronic obstructive pulmonary disease: Secondary | ICD-10-CM | POA: Diagnosis present

## 2013-12-25 DIAGNOSIS — Z23 Encounter for immunization: Secondary | ICD-10-CM

## 2013-12-25 DIAGNOSIS — Z7982 Long term (current) use of aspirin: Secondary | ICD-10-CM

## 2013-12-25 DIAGNOSIS — Z803 Family history of malignant neoplasm of breast: Secondary | ICD-10-CM

## 2013-12-25 DIAGNOSIS — F209 Schizophrenia, unspecified: Secondary | ICD-10-CM | POA: Diagnosis present

## 2013-12-25 DIAGNOSIS — R0789 Other chest pain: Secondary | ICD-10-CM | POA: Diagnosis not present

## 2013-12-25 DIAGNOSIS — I428 Other cardiomyopathies: Secondary | ICD-10-CM | POA: Diagnosis present

## 2013-12-25 DIAGNOSIS — Z794 Long term (current) use of insulin: Secondary | ICD-10-CM

## 2013-12-25 DIAGNOSIS — C50919 Malignant neoplasm of unspecified site of unspecified female breast: Secondary | ICD-10-CM | POA: Diagnosis present

## 2013-12-25 DIAGNOSIS — R071 Chest pain on breathing: Secondary | ICD-10-CM | POA: Diagnosis not present

## 2013-12-25 DIAGNOSIS — E119 Type 2 diabetes mellitus without complications: Secondary | ICD-10-CM | POA: Diagnosis present

## 2013-12-25 DIAGNOSIS — Z8673 Personal history of transient ischemic attack (TIA), and cerebral infarction without residual deficits: Secondary | ICD-10-CM

## 2013-12-25 DIAGNOSIS — E785 Hyperlipidemia, unspecified: Secondary | ICD-10-CM | POA: Diagnosis present

## 2013-12-25 DIAGNOSIS — I5022 Chronic systolic (congestive) heart failure: Secondary | ICD-10-CM | POA: Diagnosis present

## 2013-12-25 DIAGNOSIS — J449 Chronic obstructive pulmonary disease, unspecified: Secondary | ICD-10-CM | POA: Diagnosis present

## 2013-12-25 DIAGNOSIS — Z833 Family history of diabetes mellitus: Secondary | ICD-10-CM

## 2013-12-25 DIAGNOSIS — I2510 Atherosclerotic heart disease of native coronary artery without angina pectoris: Secondary | ICD-10-CM | POA: Diagnosis present

## 2013-12-25 DIAGNOSIS — F319 Bipolar disorder, unspecified: Secondary | ICD-10-CM | POA: Diagnosis present

## 2013-12-25 DIAGNOSIS — R079 Chest pain, unspecified: Secondary | ICD-10-CM | POA: Diagnosis present

## 2013-12-25 DIAGNOSIS — R0781 Pleurodynia: Secondary | ICD-10-CM

## 2013-12-25 DIAGNOSIS — Z8249 Family history of ischemic heart disease and other diseases of the circulatory system: Secondary | ICD-10-CM

## 2013-12-25 DIAGNOSIS — Z87891 Personal history of nicotine dependence: Secondary | ICD-10-CM

## 2013-12-25 DIAGNOSIS — N179 Acute kidney failure, unspecified: Secondary | ICD-10-CM

## 2013-12-25 DIAGNOSIS — I420 Dilated cardiomyopathy: Secondary | ICD-10-CM | POA: Diagnosis present

## 2013-12-25 DIAGNOSIS — K219 Gastro-esophageal reflux disease without esophagitis: Secondary | ICD-10-CM | POA: Diagnosis present

## 2013-12-25 DIAGNOSIS — E860 Dehydration: Secondary | ICD-10-CM | POA: Diagnosis present

## 2013-12-25 DIAGNOSIS — Z9221 Personal history of antineoplastic chemotherapy: Secondary | ICD-10-CM

## 2013-12-25 DIAGNOSIS — N390 Urinary tract infection, site not specified: Secondary | ICD-10-CM | POA: Diagnosis present

## 2013-12-25 DIAGNOSIS — C50911 Malignant neoplasm of unspecified site of right female breast: Secondary | ICD-10-CM | POA: Diagnosis present

## 2013-12-25 DIAGNOSIS — I509 Heart failure, unspecified: Secondary | ICD-10-CM | POA: Diagnosis present

## 2013-12-25 DIAGNOSIS — N182 Chronic kidney disease, stage 2 (mild): Secondary | ICD-10-CM | POA: Diagnosis present

## 2013-12-25 DIAGNOSIS — I5042 Chronic combined systolic (congestive) and diastolic (congestive) heart failure: Secondary | ICD-10-CM | POA: Diagnosis present

## 2013-12-25 MED ORDER — MORPHINE SULFATE 4 MG/ML IJ SOLN
4.0000 mg | Freq: Once | INTRAMUSCULAR | Status: AC
  Administered 2013-12-26: 4 mg via INTRAVENOUS
  Filled 2013-12-25: qty 1

## 2013-12-25 MED ORDER — ONDANSETRON HCL 4 MG/2ML IJ SOLN
4.0000 mg | Freq: Once | INTRAMUSCULAR | Status: AC
  Administered 2013-12-26: 4 mg via INTRAMUSCULAR
  Filled 2013-12-25: qty 2

## 2013-12-25 NOTE — ED Notes (Signed)
Pt c/o left sided abdominal pain since 10 pm tonight. Pt c/o n/v. Denies dysuria.

## 2013-12-25 NOTE — ED Provider Notes (Signed)
CSN: [id number]     Arrival date & time 12/25/13  2231 History   First MD Initiated Contact with Patient 12/25/13 2314   This chart was scribed for Merryl Hacker, MD by Rosary Lively, ED scribe. This patient was seen in room APA10/APA10 and the patient's care was started at 11:20 PM.    Chief Complaint  Patient presents with  . Abdominal Pain   HPI HPI Comments:  [name] is a 63 y.o. female with a h/o breast cancer who presents to the Emergency Department complaining of 10/ 10 left sidel pain with associated symptoms of nausea, onset 10:00 PM tonight. Pt reports that the pain is sharp, but does not radiate. She localizes the pain over her left ribs. Pt denies urinary symptoms. Her pain was acute in onset. She states pain is worse with breathing and movement.  Denies any shortness of breath. Denies any urinary symptoms, vomiting, diarrhea.  Pt reports that she is still being treated for breast cancer. Pt does not have a h/o blood clots.  Past Medical History  Diagnosis Date  . Allergic rhinitis   . Bipolar disorder   . GERD (gastroesophageal reflux disease)   . Hypokalemia   . Hyperlipemia   . COPD (chronic obstructive pulmonary disease)   . Heart palpitations   . Anemia   . Cancer     breast  . 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  . Diabetes mellitus without complication   . Coronary artery disease   . Breast cancer     rt breast. diagnosed in 2013   Past Surgical History  Procedure Laterality Date  . Sterilization    . Colonoscopy    . Upper gastrointestinal endoscopy    . Dilation and curettage of uterus    . Colonoscopy with esophagogastroduodenoscopy (egd)  02/21/2012    Procedure: COLONOSCOPY WITH ESOPHAGOGASTRODUODENOSCOPY (EGD);  Surgeon: Rogene Houston, MD;  Location: [facility] ENDO SUITE;  Service: Endoscopy;  Laterality: N/A;  2:25  . Right breast lumpectomy      benign/Dr. Arnoldo Morale ?1990's  . Breast biopsy  2013    right  breast  . Portacath placement  04/01/2012    Procedure: INSERTION PORT-A-CATH;  Surgeon: Scherry Ran, MD;  Location: [facility] ORS;  Service: General;  Laterality: Left;  Insertion Port-A-Cath Left Subclavian under Fluoroscopy   Family History  Problem Relation Age of Onset  . Seizures Mother   . COPD Father   . Hypertension Sister   . COPD Sister   . Arthritis Sister   . Diabetes Brother   . Hypertension Sister   . Breast cancer Sister     57 year survivor  . Arthritis Sister   . Thyroid disease Sister   . Arthritis Sister   . Diabetes Sister   . Arthritis Sister   . Heart disease Brother   . Healthy Brother   . Healthy Brother   . Healthy Son    History  Substance Use Topics  . Smoking status: Former Smoker -- 0.25 packs/day for 5 years    Quit date: 02/15/2012  . Smokeless tobacco: Never Used     Comment: 5 cigarettes a day   . Alcohol Use: No   OB History   Grav Para Term Preterm Abortions TAB SAB Ect Mult Living                 Review of Systems  Constitutional: Negative for fever.  Respiratory: Negative for cough, chest tightness and shortness of breath.   Cardiovascular: Positive for chest pain. Negative for leg swelling.  Gastrointestinal: Positive for nausea. Negative for vomiting, abdominal pain and diarrhea.  Genitourinary: Negative for dysuria.  Musculoskeletal: Negative for back pain.  Psychiatric/Behavioral: Negative for confusion.  All other systems reviewed and are negative.     Allergies  Ibuprofen and Other  Home Medications   Prior to Admission medications   Medication Sig Start Date End Date Taking? Authorizing Provider  aspirin 81 MG chewable tablet Chew 1 tablet (81 mg total) by mouth daily. 11/07/12   Cherene Altes, MD  atorvastatin (LIPITOR) 40 MG tablet Take 40 mg by mouth at bedtime.    Historical Provider, MD  benzonatate (TESSALON) 100 MG capsule Take by mouth 3 (three) times daily as needed for cough.    Historical Provider,  MD  denosumab (PROLIA) 60 MG/ML SOLN injection Inject 60 mg into the skin every 6 (six) months. Administer in upper arm, thigh, or abdomen    Historical Provider, MD  digoxin (LANOXIN) 0.125 MG tablet Take 0.5 tablets (0.0625 mg total) by mouth daily. 10/08/13   Rande Brunt, NP  docusate sodium (COLACE) 100 MG capsule Take 100 mg by mouth at bedtime. 02/05/12   Rogene Houston, MD  fluPHENAZine decanoate (PROLIXIN) 25 MG/ML injection Inject 25 mg into the muscle every 14 (fourteen) days.    Historical Provider, MD  furosemide (LASIX) 40 MG tablet Take 1 tablet (40 mg total) by mouth daily. 11/07/12   Cherene Altes, MD  glipiZIDE (GLUCOTROL) 5 MG tablet Take 5 mg by mouth 2 (two) times daily before a meal. 12/11/12   Amy D Clegg, NP  heparin lock flush 100 UNIT/ML SOLN 5 mLs (500 Units total) by Intracatheter route every 30 (thirty) days. 11/07/12   Cherene Altes, MD  insulin detemir (LEVEMIR) 100 UNIT/ML injection Inject 40 Units into the skin daily.     Historical Provider, MD  lisinopril (PRINIVIL,ZESTRIL) 2.5 MG tablet Take 1/2 tab in AM and 1/2 tab in PM 12/24/12   Jolaine Artist, MD  loratadine (CLARITIN) 10 MG tablet Take 10 mg by mouth daily.    Historical Provider, MD  loxapine (LOXITANE) 10 MG capsule Take 10 mg by mouth at bedtime.    Historical Provider, MD  magnesium oxide (MAG-OX) 400 (241.3 MG) MG tablet TAKE (1) TABLET TWICE DAILY.    Jolaine Artist, MD  nortriptyline (PAMELOR) 50 MG capsule Take 50 mg by mouth at bedtime.     Historical Provider, MD  omeprazole (PRILOSEC) 20 MG capsule Take 20 mg by mouth daily.    Historical Provider, MD  polyvinyl alcohol (LIQUIFILM TEARS) 1.4 % ophthalmic solution Place 1 drop into both eyes 2 (two) times daily as needed. 11/07/12   Cherene Altes, MD  potassium chloride SA (K-DUR,KLOR-CON) 20 MEQ tablet Take 1 tablet (20 mEq total) by mouth daily. 12/02/13   Jolaine Artist, MD  Propylene Glycol (SYSTANE BALANCE) 0.6 % SOLN  Apply 1 drop to eye 2 (two) times daily as needed (dry eyes).    Historical Provider, MD  ramelteon (ROZEREM) 8 MG tablet Take 8 mg by mouth at bedtime as needed for sleep.     Historical Provider, MD  spironolactone (ALDACTONE) 25 MG tablet Take 1 tablet (25 mg total) by mouth daily. 12/02/13   Shaune Pascal Bensimhon, MD  temazepam (RESTORIL) 15 MG capsule Take 15 mg by mouth at bedtime as  needed for sleep.    Historical Provider, MD  traZODone (DESYREL) 150 MG tablet Take 150 mg by mouth at bedtime.    Historical Provider, MD   BP 95/64  Pulse 48  Temp(Src) 98.1 F (36.7 C) (Oral)  Resp 20  Ht 5\' 5"  (1.651 m)  Wt 125 lb (56.7 kg)  BMI 20.80 kg/m2  SpO2 95% Physical Exam  Nursing note and vitals reviewed. Constitutional: She is oriented to person, place, and time.  Frail, thin  HENT:  Head: Normocephalic and atraumatic.  Mucous membranes dry  Eyes: Pupils are equal, round, and reactive to light.  Cardiovascular: Regular rhythm and normal heart sounds.   No murmur heard. Tachycardia  Pulmonary/Chest: Effort normal and breath sounds normal. No respiratory distress. She has no wheezes. She exhibits tenderness.  Abdominal: Soft. Bowel sounds are normal. There is no tenderness. There is no rebound and no guarding.  Musculoskeletal: She exhibits no edema.  Neurological: She is alert and oriented to person, place, and time.  Skin: Skin is warm and dry.  Psychiatric: She has a normal mood and affect.    ED Course  Procedures  DIAGNOSTIC STUDIES: Oxygen Saturation is 97% on RA, adequate by my interpretation.    Labs Review Labs Reviewed  CBC WITH DIFFERENTIAL - Abnormal; Notable for the following:    RBC 3.75 (*)    HCT 34.5 (*)    All other components within normal limits  COMPREHENSIVE METABOLIC PANEL - Abnormal; Notable for the following:    Sodium 134 (*)    Chloride 93 (*)    Glucose, Bld 265 (*)    BUN 29 (*)    Creatinine, Ser 1.74 (*)    Alkaline Phosphatase 180 (*)     Total Bilirubin 0.2 (*)    GFR calc non Af Amer 30 (*)    GFR calc Af Amer 35 (*)    All other components within normal limits  D-DIMER, QUANTITATIVE - Abnormal; Notable for the following:    D-Dimer, Quant 0.67 (*)    All other components within normal limits  LIPASE, BLOOD  TROPONIN I  URINALYSIS, ROUTINE W REFLEX MICROSCOPIC    Imaging Review Dg Chest 2 View  12/26/2013   CLINICAL DATA:  Chest and flank pain.  EXAM: CHEST  2 VIEW  COMPARISON:  11/01/2012.  FINDINGS: The power port is stable. The cardiac silhouette, mediastinal and hilar contours are within normal limits and stable. There is tortuosity, calcification an ectasia of the thoracic aorta. The lungs are hyperinflated. Pulmonary nodules are again noted consistent with known metastatic disease. Bibasilar atelectasis but no infiltrates or effusions. The bony thorax is intact.  IMPRESSION: Small bilateral pulmonary nodules consistent with metastatic disease.  Chronic lung changes but no definite acute pulmonary infiltrates.   Electronically Signed   By: Kalman Jewels M.D.   On: 12/26/2013 01:11     EKG Interpretation   Date/Time:  Friday December 25 2013 23:54:57 EDT Ventricular Rate:  98 PR Interval:  199 QRS Duration: 95 QT Interval:  342 QTC Calculation: 437 R Axis:   16 Text Interpretation:  Sinus rhythm Left atrial enlargement Similar to  prior Confirmed by Chasta Deshpande  MD, [place] ([id number]) on 12/25/2013 11:59:29 PM      MDM   Final diagnoses:  Pleuritic chest pain  Acute kidney injury    Patient presents with left side pain. Acute in onset and pleuritic in nature. Also exacerbated with movement and reproducible on exam. Pain is actually over her  left chest. She is mildly tachycardic. No evidence of lower extremity swelling. She does clinically appear dry. Basic labwork obtained. Screening d-dimer was sent to evaluate for blood clot.  EKG and troponin are reassuring. Chest x-ray shows no evidence of pleural  effusion but does show evidence of metastatic disease. No obvious rib fractures. Other lab work notable for elevated creatinine of 1.7. Baseline 1.2. Patient also has a mildly elevated d-dimer at 0.67 which is above the age adjusted cut off.  Patient was given pain medication with improvement of her pain. On repeat exam, patient is 0/0.  She has a history of systolic heart failure with an EF of 30%. She does clinically appear dry and this may be the reason for her acute kidney injury. She cannot obtain a CT to rule out PE given her kidney function. Discuss with patient and family admission for gentle hydration and further rule out tomorrow. She will be placed on empiric heparin overnight.  Discussed with Dr. Cruzita Lederer.  I personally performed the services described in this documentation, which was scribed in my presence. The recorded information has been reviewed and is accurate.    Merryl Hacker, MD 12/26/13 [phone]

## 2013-12-26 ENCOUNTER — Observation Stay (HOSPITAL_COMMUNITY): Payer: Medicare HMO

## 2013-12-26 DIAGNOSIS — F319 Bipolar disorder, unspecified: Secondary | ICD-10-CM | POA: Diagnosis present

## 2013-12-26 DIAGNOSIS — Z23 Encounter for immunization: Secondary | ICD-10-CM | POA: Diagnosis not present

## 2013-12-26 DIAGNOSIS — I509 Heart failure, unspecified: Secondary | ICD-10-CM | POA: Diagnosis present

## 2013-12-26 DIAGNOSIS — R0789 Other chest pain: Secondary | ICD-10-CM | POA: Diagnosis present

## 2013-12-26 DIAGNOSIS — E860 Dehydration: Secondary | ICD-10-CM | POA: Diagnosis present

## 2013-12-26 DIAGNOSIS — N390 Urinary tract infection, site not specified: Secondary | ICD-10-CM | POA: Diagnosis present

## 2013-12-26 DIAGNOSIS — E785 Hyperlipidemia, unspecified: Secondary | ICD-10-CM | POA: Diagnosis present

## 2013-12-26 DIAGNOSIS — R071 Chest pain on breathing: Secondary | ICD-10-CM | POA: Diagnosis present

## 2013-12-26 DIAGNOSIS — N179 Acute kidney failure, unspecified: Secondary | ICD-10-CM | POA: Diagnosis present

## 2013-12-26 DIAGNOSIS — J449 Chronic obstructive pulmonary disease, unspecified: Secondary | ICD-10-CM | POA: Diagnosis present

## 2013-12-26 DIAGNOSIS — R0781 Pleurodynia: Secondary | ICD-10-CM | POA: Diagnosis present

## 2013-12-26 DIAGNOSIS — E119 Type 2 diabetes mellitus without complications: Secondary | ICD-10-CM | POA: Diagnosis present

## 2013-12-26 DIAGNOSIS — C50919 Malignant neoplasm of unspecified site of unspecified female breast: Secondary | ICD-10-CM | POA: Diagnosis present

## 2013-12-26 DIAGNOSIS — I5042 Chronic combined systolic (congestive) and diastolic (congestive) heart failure: Secondary | ICD-10-CM | POA: Diagnosis present

## 2013-12-26 DIAGNOSIS — N182 Chronic kidney disease, stage 2 (mild): Secondary | ICD-10-CM | POA: Diagnosis present

## 2013-12-26 DIAGNOSIS — Z7982 Long term (current) use of aspirin: Secondary | ICD-10-CM | POA: Diagnosis not present

## 2013-12-26 DIAGNOSIS — K219 Gastro-esophageal reflux disease without esophagitis: Secondary | ICD-10-CM | POA: Diagnosis present

## 2013-12-26 DIAGNOSIS — Z9221 Personal history of antineoplastic chemotherapy: Secondary | ICD-10-CM | POA: Diagnosis not present

## 2013-12-26 DIAGNOSIS — Z8249 Family history of ischemic heart disease and other diseases of the circulatory system: Secondary | ICD-10-CM | POA: Diagnosis not present

## 2013-12-26 DIAGNOSIS — Z794 Long term (current) use of insulin: Secondary | ICD-10-CM | POA: Diagnosis not present

## 2013-12-26 DIAGNOSIS — I2510 Atherosclerotic heart disease of native coronary artery without angina pectoris: Secondary | ICD-10-CM | POA: Diagnosis present

## 2013-12-26 DIAGNOSIS — F209 Schizophrenia, unspecified: Secondary | ICD-10-CM | POA: Diagnosis present

## 2013-12-26 DIAGNOSIS — Z833 Family history of diabetes mellitus: Secondary | ICD-10-CM | POA: Diagnosis not present

## 2013-12-26 DIAGNOSIS — Z8673 Personal history of transient ischemic attack (TIA), and cerebral infarction without residual deficits: Secondary | ICD-10-CM | POA: Diagnosis not present

## 2013-12-26 DIAGNOSIS — Z87891 Personal history of nicotine dependence: Secondary | ICD-10-CM | POA: Diagnosis not present

## 2013-12-26 DIAGNOSIS — I428 Other cardiomyopathies: Secondary | ICD-10-CM | POA: Diagnosis present

## 2013-12-26 DIAGNOSIS — Z803 Family history of malignant neoplasm of breast: Secondary | ICD-10-CM | POA: Diagnosis not present

## 2013-12-26 DIAGNOSIS — R079 Chest pain, unspecified: Secondary | ICD-10-CM | POA: Diagnosis present

## 2013-12-26 LAB — CBC WITH DIFFERENTIAL/PLATELET
Basophils Absolute: 0 10*3/uL (ref 0.0–0.1)
Basophils Relative: 0 % (ref 0–1)
EOS PCT: 2 % (ref 0–5)
Eosinophils Absolute: 0.1 10*3/uL (ref 0.0–0.7)
HEMATOCRIT: 34.5 % — AB (ref 36.0–46.0)
HEMOGLOBIN: 12.1 g/dL (ref 12.0–15.0)
LYMPHS ABS: 2.4 10*3/uL (ref 0.7–4.0)
Lymphocytes Relative: 29 % (ref 12–46)
MCH: 32.3 pg (ref 26.0–34.0)
MCHC: 35.1 g/dL (ref 30.0–36.0)
MCV: 92 fL (ref 78.0–100.0)
MONOS PCT: 11 % (ref 3–12)
Monocytes Absolute: 0.9 10*3/uL (ref 0.1–1.0)
Neutro Abs: 4.9 10*3/uL (ref 1.7–7.7)
Neutrophils Relative %: 58 % (ref 43–77)
Platelets: 328 10*3/uL (ref 150–400)
RBC: 3.75 MIL/uL — AB (ref 3.87–5.11)
RDW: 12.8 % (ref 11.5–15.5)
WBC: 8.4 10*3/uL (ref 4.0–10.5)

## 2013-12-26 LAB — BASIC METABOLIC PANEL
Anion gap: 12 (ref 5–15)
BUN: 24 mg/dL — ABNORMAL HIGH (ref 6–23)
CHLORIDE: 98 meq/L (ref 96–112)
CO2: 26 mEq/L (ref 19–32)
Calcium: 8.5 mg/dL (ref 8.4–10.5)
Creatinine, Ser: 1.39 mg/dL — ABNORMAL HIGH (ref 0.50–1.10)
GFR calc Af Amer: 46 mL/min — ABNORMAL LOW (ref >90)
GFR calc non Af Amer: 40 mL/min — ABNORMAL LOW (ref >90)
Glucose, Bld: 241 mg/dL — ABNORMAL HIGH (ref 70–99)
POTASSIUM: 4.2 meq/L (ref 3.7–5.3)
Sodium: 136 mEq/L — ABNORMAL LOW (ref 137–147)

## 2013-12-26 LAB — URINALYSIS, ROUTINE W REFLEX MICROSCOPIC
Bilirubin Urine: NEGATIVE
Glucose, UA: 500 mg/dL — AB
KETONES UR: NEGATIVE mg/dL
Nitrite: NEGATIVE
PROTEIN: NEGATIVE mg/dL
Specific Gravity, Urine: 1.01 (ref 1.005–1.030)
Urobilinogen, UA: 0.2 mg/dL (ref 0.0–1.0)
pH: 6 (ref 5.0–8.0)

## 2013-12-26 LAB — COMPREHENSIVE METABOLIC PANEL
ALK PHOS: 180 U/L — AB (ref 39–117)
ALT: 19 U/L (ref 0–35)
ANION GAP: 13 (ref 5–15)
AST: 19 U/L (ref 0–37)
Albumin: 3.9 g/dL (ref 3.5–5.2)
BUN: 29 mg/dL — AB (ref 6–23)
CALCIUM: 9.9 mg/dL (ref 8.4–10.5)
CO2: 28 meq/L (ref 19–32)
Chloride: 93 mEq/L — ABNORMAL LOW (ref 96–112)
Creatinine, Ser: 1.74 mg/dL — ABNORMAL HIGH (ref 0.50–1.10)
GFR calc non Af Amer: 30 mL/min — ABNORMAL LOW (ref >90)
GFR, EST AFRICAN AMERICAN: 35 mL/min — AB (ref >90)
GLUCOSE: 265 mg/dL — AB (ref 70–99)
Potassium: 4.1 mEq/L (ref 3.7–5.3)
Sodium: 134 mEq/L — ABNORMAL LOW (ref 137–147)
Total Bilirubin: 0.2 mg/dL — ABNORMAL LOW (ref 0.3–1.2)
Total Protein: 7.6 g/dL (ref 6.0–8.3)

## 2013-12-26 LAB — URINE MICROSCOPIC-ADD ON

## 2013-12-26 LAB — GLUCOSE, CAPILLARY
GLUCOSE-CAPILLARY: 244 mg/dL — AB (ref 70–99)
Glucose-Capillary: 271 mg/dL — ABNORMAL HIGH (ref 70–99)
Glucose-Capillary: 237 mg/dL — ABNORMAL HIGH (ref 70–99)
Glucose-Capillary: 219 mg/dL — ABNORMAL HIGH (ref 70–99)

## 2013-12-26 LAB — LIPASE, BLOOD: Lipase: 43 U/L (ref 11–59)

## 2013-12-26 LAB — D-DIMER, QUANTITATIVE: D-Dimer, Quant: 0.67 ug/mL-FEU — ABNORMAL HIGH (ref 0.00–0.48)

## 2013-12-26 LAB — TROPONIN I
Troponin I: <0.3 ng/mL (ref <0.30)
Troponin I: <0.3 ng/mL (ref <0.30)

## 2013-12-26 MED ORDER — SODIUM CHLORIDE 0.9 % IJ SOLN
3.0000 mL | Freq: Two times a day (BID) | INTRAMUSCULAR | Status: DC
  Administered 2013-12-26: 3 mL via INTRAVENOUS

## 2013-12-26 MED ORDER — TEMAZEPAM 15 MG PO CAPS
15.0000 mg | ORAL_CAPSULE | Freq: Every evening | ORAL | Status: DC | PRN
Start: 2013-12-26 — End: 2013-12-27
  Administered 2013-12-26: 15 mg via ORAL
  Filled 2013-12-26: qty 1

## 2013-12-26 MED ORDER — ASPIRIN 81 MG PO CHEW
81.0000 mg | CHEWABLE_TABLET | Freq: Every day | ORAL | Status: DC
  Administered 2013-12-26 – 2013-12-27 (×2): 81 mg via ORAL
  Filled 2013-12-26 (×2): qty 1

## 2013-12-26 MED ORDER — SODIUM CHLORIDE 0.9 % IV BOLUS (SEPSIS)
1000.0000 mL | Freq: Once | INTRAVENOUS | Status: AC
  Administered 2013-12-26: 1000 mL via INTRAVENOUS

## 2013-12-26 MED ORDER — HEPARIN BOLUS VIA INFUSION
4000.0000 [IU] | Freq: Once | INTRAVENOUS | Status: AC
  Administered 2013-12-26: 4000 [IU] via INTRAVENOUS
  Filled 2013-12-26: qty 4000

## 2013-12-26 MED ORDER — PROPYLENE GLYCOL 0.6 % OP SOLN: 1.0000 [drp] | Freq: Two times a day (BID) | OPHTHALMIC | Status: DC | PRN

## 2013-12-26 MED ORDER — ATORVASTATIN CALCIUM 40 MG PO TABS
40.0000 mg | ORAL_TABLET | Freq: Every day | ORAL | Status: DC
  Administered 2013-12-26: 40 mg via ORAL
  Filled 2013-12-26: qty 1

## 2013-12-26 MED ORDER — LOXAPINE SUCCINATE 10 MG PO CAPS
10.0000 mg | ORAL_CAPSULE | Freq: Every day | ORAL | Status: DC
  Filled 2013-12-26 (×2): qty 1

## 2013-12-26 MED ORDER — TECHNETIUM TO 99M ALBUMIN AGGREGATED
6.0000 | Freq: Once | INTRAVENOUS | Status: AC | PRN
  Administered 2013-12-26: 6 via INTRAVENOUS

## 2013-12-26 MED ORDER — PANTOPRAZOLE SODIUM 40 MG PO TBEC
40.0000 mg | DELAYED_RELEASE_TABLET | Freq: Every day | ORAL | Status: DC
  Administered 2013-12-26 – 2013-12-27 (×2): 40 mg via ORAL
  Filled 2013-12-26 (×2): qty 1

## 2013-12-26 MED ORDER — HEPARIN (PORCINE) IN NACL 100-0.45 UNIT/ML-% IJ SOLN
1000.0000 [IU]/h | INTRAMUSCULAR | Status: DC
  Administered 2013-12-26: 1000 [IU]/h via INTRAVENOUS
  Filled 2013-12-26: qty 250

## 2013-12-26 MED ORDER — INSULIN DETEMIR 100 UNIT/ML ~~LOC~~ SOLN
36.0000 [IU] | Freq: Every day | SUBCUTANEOUS | Status: DC
  Administered 2013-12-26 – 2013-12-27 (×2): 36 [IU] via SUBCUTANEOUS
  Filled 2013-12-26 (×3): qty 0.36

## 2013-12-26 MED ORDER — TRAZODONE HCL 50 MG PO TABS
150.0000 mg | ORAL_TABLET | Freq: Every day | ORAL | Status: DC
  Administered 2013-12-26: 150 mg via ORAL
  Filled 2013-12-26: qty 3

## 2013-12-26 MED ORDER — TECHNETIUM TC 99M DIETHYLENETRIAME-PENTAACETIC ACID
45.0000 | Freq: Once | INTRAVENOUS | Status: AC | PRN
  Administered 2013-12-26: 45 via INTRAVENOUS

## 2013-12-26 MED ORDER — DIGOXIN 125 MCG PO TABS
0.0625 mg | ORAL_TABLET | Freq: Every day | ORAL | Status: DC
  Administered 2013-12-26 – 2013-12-27 (×2): 0.0625 mg via ORAL
  Filled 2013-12-26 (×2): qty 1

## 2013-12-26 MED ORDER — TRAMADOL HCL 50 MG PO TABS
50.0000 mg | ORAL_TABLET | Freq: Four times a day (QID) | ORAL | Status: DC | PRN
  Administered 2013-12-26 – 2013-12-27 (×3): 50 mg via ORAL
  Filled 2013-12-26 (×4): qty 1

## 2013-12-26 MED ORDER — DOCUSATE SODIUM 100 MG PO CAPS
100.0000 mg | ORAL_CAPSULE | Freq: Every day | ORAL | Status: DC
  Filled 2013-12-26: qty 1

## 2013-12-26 MED ORDER — LORATADINE 10 MG PO TABS
10.0000 mg | ORAL_TABLET | Freq: Every day | ORAL | Status: DC
  Administered 2013-12-26 – 2013-12-27 (×2): 10 mg via ORAL
  Filled 2013-12-26 (×2): qty 1

## 2013-12-26 MED ORDER — SODIUM CHLORIDE 0.9 % IV SOLN
INTRAVENOUS | Status: DC
  Administered 2013-12-26 – 2013-12-27 (×3): via INTRAVENOUS

## 2013-12-26 MED ORDER — INSULIN ASPART 100 UNIT/ML ~~LOC~~ SOLN
0.0000 [IU] | Freq: Three times a day (TID) | SUBCUTANEOUS | Status: DC
  Administered 2013-12-26: 3 [IU] via SUBCUTANEOUS
  Administered 2013-12-26: 5 [IU] via SUBCUTANEOUS
  Administered 2013-12-27: 3 [IU] via SUBCUTANEOUS
  Administered 2013-12-27: 2 [IU] via SUBCUTANEOUS

## 2013-12-26 MED ORDER — POLYVINYL ALCOHOL 1.4 % OP SOLN: 1.0000 [drp] | Freq: Two times a day (BID) | OPHTHALMIC | Status: DC | PRN

## 2013-12-26 MED ORDER — SODIUM CHLORIDE 0.9 % IV BOLUS (SEPSIS)
500.0000 mL | Freq: Once | INTRAVENOUS | Status: AC
  Administered 2013-12-26: 500 mL via INTRAVENOUS

## 2013-12-26 MED ORDER — NORTRIPTYLINE HCL 25 MG PO CAPS
50.0000 mg | ORAL_CAPSULE | Freq: Every day | ORAL | Status: DC
  Administered 2013-12-26: 50 mg via ORAL
  Filled 2013-12-26: qty 2

## 2013-12-26 NOTE — Progress Notes (Signed)
Patient had ventilation and perfusion test today for possible PE.  Results called to Dr. Legrand Rams (on-call for Dr. Luan Pulling).  No evidence of pulmonary embolism.  Dr. Legrand Rams gave order to discontinue heparin drip.  Orders followed.

## 2013-12-26 NOTE — ED Notes (Signed)
Called phlebotomy for blood draw

## 2013-12-26 NOTE — H&P (Signed)
History and Physical    [name] HUT:[id number] DOB: [birth year] DOA: 12/25/2013  Referring physician: Dr. Dina Rich PCP: Alonza Bogus, MD  Specialists: none   Chief Complaint: chest pain  HPI: [name] is a 63 y.o. 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 She states that her pain started with a rather sudden onset last night around 10:00, at rest. She describes the pain as sharp, with some positional component. Initially he endorses pleuritic component to the ED physician however denies that for me. She is somewhat unreliable historian. She is currently chest pain-free. She denies any fever or chills, she denies any cough or chest congestion. She denies any abdominal pain, endorses mild nausea without vomiting, denies any diarrhea. She endorses mild lightheadedness this evening. In the emergency room, was found to have acute on chronic renal failure, and given her chest pain and malignancy history a d-dimer was checked which came back borderline elevated. Heparin infusion was started by ED physician out of concern for PE and [facility] was called for admission for further evaluation.  Review of Systems: As per history of present illness, otherwise negative  Past Medical History  Diagnosis Date  . Allergic rhinitis   . Bipolar disorder   . GERD (gastroesophageal reflux disease)   . Hypokalemia   . Hyperlipemia   . COPD (chronic obstructive pulmonary disease)   . Heart palpitations   . Anemia   . Cancer     breast  . 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  .  Diabetes mellitus without complication   . Coronary artery disease   . Breast cancer     rt breast. diagnosed in 2013   Past Surgical History  Procedure Laterality Date  . Sterilization    . Colonoscopy    . Upper gastrointestinal endoscopy    . Dilation and curettage of uterus    . Colonoscopy with esophagogastroduodenoscopy (egd)  02/21/2012    Procedure: COLONOSCOPY WITH ESOPHAGOGASTRODUODENOSCOPY (EGD);  Surgeon: Rogene Houston, MD;  Location: [facility];  Service: Endoscopy;  Laterality: N/A;  2:25  . Right breast lumpectomy      benign/Dr. Arnoldo Morale ?1990's  . Breast biopsy  2013    right breast  . Portacath placement  04/01/2012    Procedure: INSERTION PORT-A-CATH;  Surgeon: Scherry Ran, MD;  Location: [facility];  Service: General;  Laterality: Left;  Insertion Port-A-Cath Left Subclavian under Fluoroscopy   Social History:  reports that she quit smoking about 22 months ago. She has never used smokeless tobacco. She reports that she does not drink alcohol or use illicit drugs.  Allergies  Allergen Reactions  . Ibuprofen Nausea Only  . Other Other (See Comments)    Ragweed Pepper - Sneezing    Family History  Problem Relation Age of Onset  . Seizures Mother   . COPD Father   . Hypertension Sister   . COPD Sister   . Arthritis Sister   . Diabetes Brother   . Hypertension Sister   . Breast cancer Sister     48 year survivor  . Arthritis Sister   .  Thyroid disease Sister   . Arthritis Sister   . Diabetes Sister   . Arthritis Sister   . Heart disease Brother   . Healthy Brother   . Healthy Brother   . Healthy Son    Prior to Admission medications   Medication Sig Start Date End Date Taking? Authorizing Provider  aspirin 81 MG chewable tablet Chew 1 tablet (81 mg total) by mouth daily. 11/07/12   Cherene Altes, MD  atorvastatin (LIPITOR) 40 MG tablet Take 40 mg by mouth at bedtime.    Historical Provider, MD  benzonatate (TESSALON) 100 MG capsule Take by  mouth 3 (three) times daily as needed for cough.    Historical Provider, MD  denosumab (PROLIA) 60 MG/ML SOLN injection Inject 60 mg into the skin every 6 (six) months. Administer in upper arm, thigh, or abdomen    Historical Provider, MD  digoxin (LANOXIN) 0.125 MG tablet Take 0.5 tablets (0.0625 mg total) by mouth daily. 10/08/13   Rande Brunt, NP  docusate sodium (COLACE) 100 MG capsule Take 100 mg by mouth at bedtime. 02/05/12   Rogene Houston, MD  fluPHENAZine decanoate (PROLIXIN) 25 MG/ML injection Inject 25 mg into the muscle every 14 (fourteen) days.    Historical Provider, MD  furosemide (LASIX) 40 MG tablet Take 1 tablet (40 mg total) by mouth daily. 11/07/12   Cherene Altes, MD  glipiZIDE (GLUCOTROL) 5 MG tablet Take 5 mg by mouth 2 (two) times daily before a meal. 12/11/12   Amy D Clegg, NP  heparin lock flush 100 UNIT/ML SOLN 5 mLs (500 Units total) by Intracatheter route every 30 (thirty) days. 11/07/12   Cherene Altes, MD  insulin detemir (LEVEMIR) 100 UNIT/ML injection Inject 40 Units into the skin daily.     Historical Provider, MD  lisinopril (PRINIVIL,ZESTRIL) 2.5 MG tablet Take 1/2 tab in AM and 1/2 tab in PM 12/24/12   Jolaine Artist, MD  loratadine (CLARITIN) 10 MG tablet Take 10 mg by mouth daily.    Historical Provider, MD  loxapine (LOXITANE) 10 MG capsule Take 10 mg by mouth at bedtime.    Historical Provider, MD  magnesium oxide (MAG-OX) 400 (241.3 MG) MG tablet TAKE (1) TABLET TWICE DAILY.    Jolaine Artist, MD  nortriptyline (PAMELOR) 50 MG capsule Take 50 mg by mouth at bedtime.     Historical Provider, MD  omeprazole (PRILOSEC) 20 MG capsule Take 20 mg by mouth daily.    Historical Provider, MD  polyvinyl alcohol (LIQUIFILM TEARS) 1.4 % ophthalmic solution Place 1 drop into both eyes 2 (two) times daily as needed. 11/07/12   Cherene Altes, MD  potassium chloride SA (K-DUR,KLOR-CON) 20 MEQ tablet Take 1 tablet (20 mEq total) by mouth daily. 12/02/13    Jolaine Artist, MD  Propylene Glycol (SYSTANE BALANCE) 0.6 % SOLN Apply 1 drop to eye 2 (two) times daily as needed (dry eyes).    Historical Provider, MD  ramelteon (ROZEREM) 8 MG tablet Take 8 mg by mouth at bedtime as needed for sleep.     Historical Provider, MD  spironolactone (ALDACTONE) 25 MG tablet Take 1 tablet (25 mg total) by mouth daily. 12/02/13   Jolaine Artist, MD  temazepam (RESTORIL) 15 MG capsule Take 15 mg by mouth at bedtime as needed for sleep.    Historical Provider, MD  traZODone (DESYREL) 150 MG tablet Take 150 mg by mouth at bedtime.    Historical Provider, MD  Physical Exam: Filed Vitals:   12/26/13 0030 12/26/13 0031 12/26/13 0230 12/26/13 0300  BP: 98/71 98/71 91/61  110/69  Pulse: 99 99 91   Temp:      TempSrc:      Resp: 16 14 21 12   Height:      Weight:      SpO2: 93% 98% 95%     General:  No apparent distress  Neck: supple, no JVD  Cardiovascular: regular rate without MRG; 2+ peripheral pulses  Respiratory: CTA biL, good air movement without crackles  Abdomen: soft, non tender to palpation  Musculoskeletal: no peripheral edema  Psychiatric: flat affect  Neurologic: non focal  Labs on Admission:  Basic Metabolic Panel:  Recent Labs Lab 12/26/13 0114  NA 134*  K 4.1  CL 93*  CO2 28  GLUCOSE 265*  BUN 29*  CREATININE 1.74*  CALCIUM 9.9   Liver Function Tests:  Recent Labs Lab 12/26/13 0114  AST 19  ALT 19  ALKPHOS 180*  BILITOT 0.2*  PROT 7.6  ALBUMIN 3.9    Recent Labs Lab 12/26/13 0114  LIPASE 43   CBC:  Recent Labs Lab 12/26/13 0114  WBC 8.4  NEUTROABS 4.9  HGB 12.1  HCT 34.5*  MCV 92.0  PLT 328   Cardiac Enzymes:  Recent Labs Lab 12/26/13 0114  TROPONINI <0.30    BNP (last 3 results)  Recent Labs  10/30/13 1230  PROBNP 206.8*   Radiological Exams on Admission: Dg Chest 2 View  12/26/2013   CLINICAL DATA:  Chest and flank pain.  EXAM: CHEST  2 VIEW  COMPARISON:  11/01/2012.   FINDINGS: The power port is stable. The cardiac silhouette, mediastinal and hilar contours are within normal limits and stable. There is tortuosity, calcification an ectasia of the thoracic aorta. The lungs are hyperinflated. Pulmonary nodules are again noted consistent with known metastatic disease. Bibasilar atelectasis but no infiltrates or effusions. The bony thorax is intact.  IMPRESSION: Small bilateral pulmonary nodules consistent with metastatic disease.  Chronic lung changes but no definite acute pulmonary infiltrates.   Electronically Signed   By: Kalman Jewels M.D.   On: 12/26/2013 01:11    EKG: Independently reviewed.  Assessment/Plan Active Problems:   Schizophrenia   Hyperlipidemia   Breast cancer, right breast   Congestive dilated cardiomyopathy   CAD (coronary artery disease)   Chronic systolic CHF (congestive heart failure)   Diabetes   Pleuritic chest pain   Chest pain   #1 Chest pain - with atypical features, but known CAD, will observe on telemetry and cycle CEs. 2D echo was done this July.  - certainly PE is a consideration as she has underlying malignancy and is relatively immobile. Her d-dimer was elevated, but this may be just in the setting of acute on chronic renal failure. She will need some form of imaging to rule out PE, her renal function precluded doing that in the emergency room, we'll provide gentle hydration and repeat the BMP in the morning, we'll do a CT angiogram if the GFR has gotten better versus VQ scan. Continue heparin for now. Obtain lower extremity DVT ultrasound. - Dr. Darl Householder mentioned that he was concerned for pulmonary metastasis, hopefully will be able to get CTPA   #2 chronic systolic heart failure - 2D echo in July showed an EF of 25-30%. Dr. Claris Gladden review of the 2D echo appreciated her EF at 35-40%. Given her relative hypotension on admission, will hold Lasix and spironolactone and provide gentle hydration.  She is clinically dry, closely  monitor her fluid status. Resume digoxin. Hold ACE inhibitor due to hypotension. She is not on a beta blocker or higher doses of ACEI due to soft BPs in the past as well.  - Her weight range per cardiology is 130 to 132 pounds. She currently is 125.  #3  Breast cancer - this is followed up as an outpatient by Dr. Tressie Stalker. Patient is scheduled to have a PET scan soon.   #4 diabetes mellitus - restart long-acting insulin as well as sliding scale.  #5 acute on chronic renal failure - (probably CKD stage II) she seems to be dehydrated currently, gentle hydration, repeat BMP in am.  #6 schizophrenia - continue home medications  Diet: heart healthy Fluids: NS DVT Prophylaxis: heparin  Code Status: Full  Family Communication: sister bedside  Disposition Plan: inpatient  Time spent: 67  This note has been created with Surveyor, quantity. Any transcriptional errors are unintentional.   Kie Calvin M. Cruzita Lederer, MD [facility] Pager [phone]  If 7PM-7AM, please contact night-coverage [email or web address] Password [email or web address] 12/26/2013, 3:21 AM

## 2013-12-26 NOTE — Progress Notes (Signed)
ANTICOAGULATION CONSULT NOTE - Initial Consult  Pharmacy Consult for Heparin Indication: pulmonary embolus  Allergies  Allergen Reactions  . Ibuprofen Nausea Only  . Other Other (See Comments)    Ragweed Pepper - Sneezing    Patient Measurements: Height: 5\' 5"  (165.1 cm) Weight: 125 lb (56.7 kg) IBW/kg (Calculated) : 57  Vital Signs: Temp: 97.6 F (36.4 C) (09/12 0359) Temp src: Oral (09/12 0359) BP: 115/65 mmHg (09/12 0359) Pulse Rate: 96 (09/12 0359)  Labs:  Recent Labs  12/26/13 0114  HGB 12.1  HCT 34.5*  PLT 328  CREATININE 1.74*  TROPONINI <0.30    Estimated Creatinine Clearance: 30 ml/min (by C-G formula based on Cr of 1.74).   Medical History: Past Medical History  Diagnosis Date  . Allergic rhinitis   . Bipolar disorder   . GERD (gastroesophageal reflux disease)   . Hypokalemia   . Hyperlipemia   . COPD (chronic obstructive pulmonary disease)   . Heart palpitations   . Anemia   . Cancer     breast  . 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  . Diabetes mellitus without complication   . Coronary artery disease   . Breast cancer     rt breast. diagnosed in 2013    Medications:  Prescriptions prior to admission  Medication Sig Dispense Refill  . aspirin 81 MG chewable tablet Chew 1 tablet (81 mg total) by mouth daily.      Marland Kitchen atorvastatin (LIPITOR) 40 MG tablet Take 40 mg by mouth at bedtime.      . benzonatate (TESSALON) 100 MG capsule Take by mouth 3 (three) times daily as needed for cough.      . denosumab (PROLIA) 60 MG/ML SOLN injection Inject 60 mg into the skin every 6 (six) months. Administer in upper arm, thigh, or abdomen      . digoxin (LANOXIN) 0.125 MG tablet Take 0.5 tablets (0.0625 mg total) by mouth daily.  15 tablet  6  . docusate sodium (COLACE) 100 MG capsule Take 100 mg by mouth at bedtime.      . fluPHENAZine decanoate (PROLIXIN) 25 MG/ML injection Inject 25 mg into the muscle every 14  (fourteen) days.      . furosemide (LASIX) 40 MG tablet Take 1 tablet (40 mg total) by mouth daily.  30 tablet    . glipiZIDE (GLUCOTROL) 5 MG tablet Take 5 mg by mouth 2 (two) times daily before a meal.      . heparin lock flush 100 UNIT/ML SOLN 5 mLs (500 Units total) by Intracatheter route every 30 (thirty) days.      . insulin detemir (LEVEMIR) 100 UNIT/ML injection Inject 40 Units into the skin daily.       Marland Kitchen lisinopril (PRINIVIL,ZESTRIL) 2.5 MG tablet Take 1/2 tab in AM and 1/2 tab in PM      . loratadine (CLARITIN) 10 MG tablet Take 10 mg by mouth daily.      Marland Kitchen loxapine (LOXITANE) 10 MG capsule Take 10 mg by mouth at bedtime.      . magnesium oxide (MAG-OX) 400 (241.3 MG) MG tablet TAKE (1) TABLET TWICE DAILY.  60 tablet  6  . nortriptyline (PAMELOR) 50 MG capsule Take 50 mg by mouth at bedtime.       Marland Kitchen omeprazole (PRILOSEC) 20 MG capsule Take 20 mg by mouth daily.      . polyvinyl alcohol (LIQUIFILM TEARS) 1.4 % ophthalmic solution Place 1  drop into both eyes 2 (two) times daily as needed.  15 mL  0  . potassium chloride SA (K-DUR,KLOR-CON) 20 MEQ tablet Take 1 tablet (20 mEq total) by mouth daily.  30 tablet  6  . Propylene Glycol (SYSTANE BALANCE) 0.6 % SOLN Apply 1 drop to eye 2 (two) times daily as needed (dry eyes).      . ramelteon (ROZEREM) 8 MG tablet Take 8 mg by mouth at bedtime as needed for sleep.       Marland Kitchen spironolactone (ALDACTONE) 25 MG tablet Take 1 tablet (25 mg total) by mouth daily.  30 tablet  6  . temazepam (RESTORIL) 15 MG capsule Take 15 mg by mouth at bedtime as needed for sleep.      . traZODone (DESYREL) 150 MG tablet Take 150 mg by mouth at bedtime.        Assessment: 63yo female c/o chest pain.  Pt with h/o breast cancer s/p chemo.  Asked to initiate heparin due to concern for possible pulmonary embolus.    Goal of Therapy:  Heparin level 0.3-0.7 units/ml Monitor platelets by anticoagulation protocol: Yes   Plan:  Heparin 4000 unit bolus now Heparin  infusion at 1000 units/hr Heparin level in 6 hours then daily CBC daily while on heparin  Hart Robinsons A 12/26/2013,6:06 AM

## 2013-12-26 NOTE — Progress Notes (Signed)
Subjective: She was admitted yesterday with chest pain. She has an abnormal chest x-ray with what may be areas of ovarian metastatic breast cancer. She is high risk for a pulmonary embolus.  Objective: Vital signs in last 24 hours: Temp:  [97.6 F (36.4 C)-98.1 F (36.7 C)] 97.6 F (36.4 C) (09/12 0359) Pulse Rate:  [48-111] 96 (09/12 0359) Resp:  [12-21] 16 (09/12 0359) BP: (80-115)/(61-71) 115/65 mmHg (09/12 0359) SpO2:  [93 %-100 %] 100 % (09/12 0359) Weight:  [56.7 kg (125 lb)] 56.7 kg (125 lb) (09/11 2237) Weight change:  Last BM Date: 12/25/13  Intake/Output from previous day: 09/11 0701 - 09/12 0700 In: -  Out: 400 [Urine:400]  PHYSICAL EXAM General appearance: alert, cooperative and no distress Resp: clear to auscultation bilaterally Cardio: regular rate and rhythm, S1, S2 normal, no murmur, click, rub or gallop GI: soft, non-tender; bowel sounds normal; no masses,  no organomegaly Extremities: extremities normal, atraumatic, no cyanosis or edema  Lab Results:  Results for orders placed during the hospital encounter of 12/25/13 (from the past 48 hour(s))  CBC WITH DIFFERENTIAL     Status: Abnormal   Collection Time    12/26/13  1:14 AM      Result Value Ref Range   WBC 8.4  4.0 - 10.5 K/uL   RBC 3.75 (*) 3.87 - 5.11 MIL/uL   Hemoglobin 12.1  12.0 - 15.0 g/dL   HCT 34.5 (*) 36.0 - 46.0 %   MCV 92.0  78.0 - 100.0 fL   MCH 32.3  26.0 - 34.0 pg   MCHC 35.1  30.0 - 36.0 g/dL   RDW 12.8  11.5 - 15.5 %   Platelets 328  150 - 400 K/uL   Neutrophils Relative % 58  43 - 77 %   Neutro Abs 4.9  1.7 - 7.7 K/uL   Lymphocytes Relative 29  12 - 46 %   Lymphs Abs 2.4  0.7 - 4.0 K/uL   Monocytes Relative 11  3 - 12 %   Monocytes Absolute 0.9  0.1 - 1.0 K/uL   Eosinophils Relative 2  0 - 5 %   Eosinophils Absolute 0.1  0.0 - 0.7 K/uL   Basophils Relative 0  0 - 1 %   Basophils Absolute 0.0  0.0 - 0.1 K/uL  COMPREHENSIVE METABOLIC PANEL     Status: Abnormal   Collection  Time    12/26/13  1:14 AM      Result Value Ref Range   Sodium 134 (*) 137 - 147 mEq/L   Potassium 4.1  3.7 - 5.3 mEq/L   Chloride 93 (*) 96 - 112 mEq/L   CO2 28  19 - 32 mEq/L   Glucose, Bld 265 (*) 70 - 99 mg/dL   BUN 29 (*) 6 - 23 mg/dL   Creatinine, Ser 1.74 (*) 0.50 - 1.10 mg/dL   Calcium 9.9  8.4 - 10.5 mg/dL   Total Protein 7.6  6.0 - 8.3 g/dL   Albumin 3.9  3.5 - 5.2 g/dL   AST 19  0 - 37 U/L   ALT 19  0 - 35 U/L   Alkaline Phosphatase 180 (*) 39 - 117 U/L   Total Bilirubin 0.2 (*) 0.3 - 1.2 mg/dL   GFR calc non Af Amer 30 (*) >90 mL/min   GFR calc Af Amer 35 (*) >90 mL/min   Comment: (NOTE)     The eGFR has been calculated using the CKD EPI equation.  This calculation has not been validated in all clinical situations.     eGFR's persistently <90 mL/min signify possible Chronic Kidney     Disease.   Anion gap 13  5 - 15  LIPASE, BLOOD     Status: None   Collection Time    12/26/13  1:14 AM      Result Value Ref Range   Lipase 43  11 - 59 U/L  TROPONIN I     Status: None   Collection Time    12/26/13  1:14 AM      Result Value Ref Range   Troponin I <0.30  <0.30 ng/mL   Comment:            Due to the release kinetics of cTnI,     a negative result within the first hours     of the onset of symptoms does not rule out     myocardial infarction with certainty.     If myocardial infarction is still suspected,     repeat the test at appropriate intervals.  D-DIMER, QUANTITATIVE     Status: Abnormal   Collection Time    12/26/13  1:14 AM      Result Value Ref Range   D-Dimer, Quant 0.67 (*) 0.00 - 0.48 ug/mL-FEU   Comment:            AT THE INHOUSE ESTABLISHED CUTOFF     VALUE OF 0.48 ug/mL FEU,     THIS ASSAY HAS BEEN DOCUMENTED     IN THE LITERATURE TO HAVE     A SENSITIVITY AND NEGATIVE     PREDICTIVE VALUE OF AT LEAST     98 TO 99%.  THE TEST RESULT     SHOULD BE CORRELATED WITH     AN ASSESSMENT OF THE CLINICAL     PROBABILITY OF DVT / VTE.   URINALYSIS, ROUTINE W REFLEX MICROSCOPIC     Status: Abnormal   Collection Time    12/26/13  3:55 AM      Result Value Ref Range   Color, Urine YELLOW  YELLOW   APPearance HAZY (*) CLEAR   Specific Gravity, Urine 1.010  1.005 - 1.030   pH 6.0  5.0 - 8.0   Glucose, UA 500 (*) NEGATIVE mg/dL   Hgb urine dipstick TRACE (*) NEGATIVE   Bilirubin Urine NEGATIVE  NEGATIVE   Ketones, ur NEGATIVE  NEGATIVE mg/dL   Protein, ur NEGATIVE  NEGATIVE mg/dL   Urobilinogen, UA 0.2  0.0 - 1.0 mg/dL   Nitrite NEGATIVE  NEGATIVE   Leukocytes, UA MODERATE (*) NEGATIVE  URINE MICROSCOPIC-ADD ON     Status: Abnormal   Collection Time    12/26/13  3:55 AM      Result Value Ref Range   Squamous Epithelial / LPF RARE  RARE   WBC, UA TOO NUMEROUS TO COUNT  <3 WBC/hpf   RBC / HPF 3-6  <3 RBC/hpf   Bacteria, UA MANY (*) RARE  GLUCOSE, CAPILLARY     Status: Abnormal   Collection Time    12/26/13  7:29 AM      Result Value Ref Range   Glucose-Capillary 271 (*) 70 - 99 mg/dL   Comment 1 Documented in Chart     Comment 2 Notify RN      ABGS No results found for this basename: PHART, PCO2, PO2ART, TCO2, HCO3,  in the last 72 hours CULTURES No results found for this or any previous  visit (from the past 240 hour(s)). Studies/Results: Dg Chest 2 View  12/26/2013   CLINICAL DATA:  Chest and flank pain.  EXAM: CHEST  2 VIEW  COMPARISON:  11/01/2012.  FINDINGS: The power port is stable. The cardiac silhouette, mediastinal and hilar contours are within normal limits and stable. There is tortuosity, calcification an ectasia of the thoracic aorta. The lungs are hyperinflated. Pulmonary nodules are again noted consistent with known metastatic disease. Bibasilar atelectasis but no infiltrates or effusions. The bony thorax is intact.  IMPRESSION: Small bilateral pulmonary nodules consistent with metastatic disease.  Chronic lung changes but no definite acute pulmonary infiltrates.   Electronically Signed   By: Kalman Jewels M.D.   On: 12/26/2013 01:11    Medications:  Prior to Admission:  Prescriptions prior to admission  Medication Sig Dispense Refill  . aspirin 81 MG chewable tablet Chew 1 tablet (81 mg total) by mouth daily.      Marland Kitchen atorvastatin (LIPITOR) 40 MG tablet Take 40 mg by mouth at bedtime.      . benzonatate (TESSALON) 100 MG capsule Take by mouth 3 (three) times daily as needed for cough.      . denosumab (PROLIA) 60 MG/ML SOLN injection Inject 60 mg into the skin every 6 (six) months. Administer in upper arm, thigh, or abdomen      . digoxin (LANOXIN) 0.125 MG tablet Take 0.5 tablets (0.0625 mg total) by mouth daily.  15 tablet  6  . docusate sodium (COLACE) 100 MG capsule Take 100 mg by mouth at bedtime.      . fluPHENAZine decanoate (PROLIXIN) 25 MG/ML injection Inject 25 mg into the muscle every 14 (fourteen) days.      . furosemide (LASIX) 40 MG tablet Take 1 tablet (40 mg total) by mouth daily.  30 tablet    . glipiZIDE (GLUCOTROL) 5 MG tablet Take 5 mg by mouth 2 (two) times daily before a meal.      . heparin lock flush 100 UNIT/ML SOLN 5 mLs (500 Units total) by Intracatheter route every 30 (thirty) days.      . insulin detemir (LEVEMIR) 100 UNIT/ML injection Inject 40 Units into the skin daily.       Marland Kitchen lisinopril (PRINIVIL,ZESTRIL) 2.5 MG tablet Take 1/2 tab in AM and 1/2 tab in PM      . loratadine (CLARITIN) 10 MG tablet Take 10 mg by mouth daily.      Marland Kitchen loxapine (LOXITANE) 10 MG capsule Take 10 mg by mouth at bedtime.      . magnesium oxide (MAG-OX) 400 (241.3 MG) MG tablet TAKE (1) TABLET TWICE DAILY.  60 tablet  6  . nortriptyline (PAMELOR) 50 MG capsule Take 50 mg by mouth at bedtime.       Marland Kitchen omeprazole (PRILOSEC) 20 MG capsule Take 20 mg by mouth daily.      . polyvinyl alcohol (LIQUIFILM TEARS) 1.4 % ophthalmic solution Place 1 drop into both eyes 2 (two) times daily as needed.  15 mL  0  . potassium chloride SA (K-DUR,KLOR-CON) 20 MEQ tablet Take 1 tablet (20 mEq total)  by mouth daily.  30 tablet  6  . Propylene Glycol (SYSTANE BALANCE) 0.6 % SOLN Apply 1 drop to eye 2 (two) times daily as needed (dry eyes).      . ramelteon (ROZEREM) 8 MG tablet Take 8 mg by mouth at bedtime as needed for sleep.       Marland Kitchen spironolactone (ALDACTONE) 25 MG  tablet Take 1 tablet (25 mg total) by mouth daily.  30 tablet  6  . temazepam (RESTORIL) 15 MG capsule Take 15 mg by mouth at bedtime as needed for sleep.      . traZODone (DESYREL) 150 MG tablet Take 150 mg by mouth at bedtime.       Scheduled: . aspirin  81 mg Oral Daily  . atorvastatin  40 mg Oral QHS  . digoxin  0.0625 mg Oral Daily  . docusate sodium  100 mg Oral QHS  . insulin aspart  0-9 Units Subcutaneous TID WC  . insulin detemir  36 Units Subcutaneous Daily  . loratadine  10 mg Oral Daily  . loxapine  10 mg Oral QHS  . nortriptyline  50 mg Oral QHS  . pantoprazole  40 mg Oral Daily  . sodium chloride  3 mL Intravenous Q12H  . traZODone  150 mg Oral QHS   Continuous: . sodium chloride 50 mL/hr at 12/26/13 0415  . heparin 1,000 Units/hr (12/26/13 5271)   SJW:TGRMBOBOF alcohol, temazepam, traMADol  Assesment: She has a history of breast cancer and it appears that she may have metastatic disease. She was admitted with pleuritic chest pain and she is high risk for pulmonary embolus. Her renal function is not good enough for her to have CT of the chest. She does have some congestive heart  Does not appear to be active right now. She has schizophrenia which complicates her treatment Active Problems:   Schizophrenia   Hyperlipidemia   Breast cancer, right breast   Congestive dilated cardiomyopathy   CAD (coronary artery disease)   Chronic systolic CHF (congestive heart failure)   Diabetes   Pleuritic chest pain   Chest pain    Plan: A ventilation perfusion lung scan. She will try tramadol for chest pain. She's on a heparin drip. She says that she wants to take Aleve I don't want her on that while she is  on heparin    LOS: 1 day   Della Scrivener L 12/26/2013, 9:15 AM

## 2013-12-26 NOTE — ED Notes (Signed)
Report given to [facility] on Dept. [place]

## 2013-12-27 LAB — CBC
HCT: 31.2 % — ABNORMAL LOW (ref 36.0–46.0)
Hemoglobin: 10.7 g/dL — ABNORMAL LOW (ref 12.0–15.0)
MCH: 32.3 pg (ref 26.0–34.0)
MCHC: 34.3 g/dL (ref 30.0–36.0)
MCV: 94.3 fL (ref 78.0–100.0)
PLATELETS: 282 10*3/uL (ref 150–400)
RBC: 3.31 MIL/uL — ABNORMAL LOW (ref 3.87–5.11)
RDW: 13.1 % (ref 11.5–15.5)
WBC: 7.7 10*3/uL (ref 4.0–10.5)

## 2013-12-27 LAB — GLUCOSE, CAPILLARY
GLUCOSE-CAPILLARY: 222 mg/dL — AB (ref 70–99)
Glucose-Capillary: 182 mg/dL — ABNORMAL HIGH (ref 70–99)

## 2013-12-27 MED ORDER — CIPROFLOXACIN HCL 250 MG PO TABS: 250.0000 mg | ORAL_TABLET | Freq: Two times a day (BID) | ORAL | Status: DC

## 2013-12-27 MED ORDER — TRAMADOL HCL 50 MG PO TABS: 50.0000 mg | ORAL_TABLET | Freq: Four times a day (QID) | ORAL | Status: DC | PRN

## 2013-12-27 MED ORDER — HEPARIN SOD (PORK) LOCK FLUSH 100 UNIT/ML IV SOLN
500.0000 [IU] | INTRAVENOUS | Status: AC | PRN
  Administered 2013-12-27: 500 [IU]
  Filled 2013-12-27: qty 5

## 2013-12-27 NOTE — Discharge Summary (Signed)
Physician Discharge Summary  Patient ID: [name] MRN: [id number] DOB/AGE: [birth year] 62 y.o. Primary Care Physician:Allyssia Skluzacek L, MD Admit date: 12/25/2013 Discharge date: 12/27/2013    Discharge Diagnoses:   Active Problems:   Schizophrenia   Hyperlipidemia   Breast cancer, right breast   Congestive dilated cardiomyopathy   CAD (coronary artery disease)   Chronic systolic CHF (congestive heart failure)   Diabetes   Pleuritic chest pain   Chest pain  possible urinary tract infection Side effect of medication    Medication List    STOP taking these medications       temazepam 15 MG capsule  Commonly known as:  RESTORIL      TAKE these medications       aspirin 81 MG chewable tablet  Chew 1 tablet (81 mg total) by mouth daily.     atorvastatin 40 MG tablet  Commonly known as:  LIPITOR  Take 40 mg by mouth at bedtime.     benzonatate 100 MG capsule  Commonly known as:  TESSALON  Take by mouth 3 (three) times daily as needed for cough.     ciprofloxacin 250 MG tablet  Commonly known as:  CIPRO  Take 1 tablet (250 mg total) by mouth 2 (two) times daily.     denosumab 60 MG/ML Soln injection  Commonly known as:  PROLIA  Inject 60 mg into the skin every 6 (six) months. Administer in upper arm, thigh, or abdomen     digoxin 0.125 MG tablet  Commonly known as:  LANOXIN  Take 0.5 tablets (0.0625 mg total) by mouth daily.     docusate sodium 100 MG capsule  Commonly known as:  COLACE  Take 100 mg by mouth at bedtime.     fluPHENAZine decanoate 25 MG/ML injection  Commonly known as:  PROLIXIN  Inject 25 mg into the muscle every 14 (fourteen) days.     furosemide 40 MG tablet  Commonly known as:  LASIX  Take 1 tablet (40 mg total) by mouth daily.     glipiZIDE 5 MG tablet  Commonly known as:  GLUCOTROL  Take 5 mg by mouth 2 (two) times daily before a meal.     heparin lock flush 100 UNIT/ML Soln injection  5 mLs (500 Units total) by  Intracatheter route every 30 (thirty) days.     insulin detemir 100 UNIT/ML injection  Commonly known as:  LEVEMIR  Inject 40 Units into the skin daily.     lisinopril 2.5 MG tablet  Commonly known as:  PRINIVIL,ZESTRIL  Take 1/2 tab in AM and 1/2 tab in PM     loratadine 10 MG tablet  Commonly known as:  CLARITIN  Take 10 mg by mouth daily.     loxapine 10 MG capsule  Commonly known as:  LOXITANE  Take 10 mg by mouth at bedtime.     magnesium oxide 400 (241.3 MG) MG tablet  Commonly known as:  MAG-OX  TAKE (1) TABLET TWICE DAILY.     nortriptyline 50 MG capsule  Commonly known as:  PAMELOR  Take 50 mg by mouth at bedtime.     omeprazole 20 MG capsule  Commonly known as:  PRILOSEC  Take 20 mg by mouth daily.     polyvinyl alcohol 1.4 % ophthalmic solution  Commonly known as:  LIQUIFILM TEARS  Place 1 drop into both eyes 2 (two) times daily as needed.     potassium chloride SA 20 MEQ tablet  Commonly  known as:  K-DUR,KLOR-CON  Take 1 tablet (20 mEq total) by mouth daily.     ramelteon 8 MG tablet  Commonly known as:  ROZEREM  Take 8 mg by mouth at bedtime as needed for sleep.     spironolactone 25 MG tablet  Commonly known as:  ALDACTONE  Take 1 tablet (25 mg total) by mouth daily.     SYSTANE BALANCE 0.6 % Soln  Generic drug:  Propylene Glycol  Apply 1 drop to eye 2 (two) times daily as needed (dry eyes).     traMADol 50 MG tablet  Commonly known as:  ULTRAM  Take 1 tablet (50 mg total) by mouth every 6 (six) hours as needed for moderate pain.     traZODone 150 MG tablet  Commonly known as:  DESYREL  Take 150 mg by mouth at bedtime.        Discharged Condition: Improved    Consults: None  Significant Diagnostic Studies: Dg Chest 2 View  12/26/2013   CLINICAL DATA:  Chest and flank pain.  EXAM: CHEST  2 VIEW  COMPARISON:  11/01/2012.  FINDINGS: The power port is stable. The cardiac silhouette, mediastinal and hilar contours are within normal limits  and stable. There is tortuosity, calcification an ectasia of the thoracic aorta. The lungs are hyperinflated. Pulmonary nodules are again noted consistent with known metastatic disease. Bibasilar atelectasis but no infiltrates or effusions. The bony thorax is intact.  IMPRESSION: Small bilateral pulmonary nodules consistent with metastatic disease.  Chronic lung changes but no definite acute pulmonary infiltrates.   Electronically Signed   By: Kalman Jewels M.D.   On: 12/26/2013 01:11   Nm Pulmonary Perf And Vent  12/26/2013   CLINICAL DATA:  Left-sided chest pain  EXAM: NUCLEAR MEDICINE VENTILATION - PERFUSION LUNG SCAN  TECHNIQUE: Ventilation images were obtained in multiple projections using inhaled aerosol technetium 99 M DTPA. Perfusion images were obtained in multiple projections after intravenous injection of Tc-11m MAA.  RADIOPHARMACEUTICALS:  Forty-four mCi Tc-30m DTPA aerosol and 6 mCi Tc-65m MAA  COMPARISON:  Chest radiograph 12/26/2013  FINDINGS: Ventilation: No focal ventilation defect.  Perfusion: No wedge shaped peripheral perfusion defects to suggest acute pulmonary embolism.  IMPRESSION: No evidence of pulmonary embolism.   Electronically Signed   By: Skipper Cliche M.D.   On: 12/26/2013 12:54   US Venous Img Lower Bilateral  12/26/2013   CLINICAL DATA:  Chest pain  EXAM: BILATERAL LOWER EXTREMITY VENOUS DOPPLER ULTRASOUND  TECHNIQUE: Gray-scale sonography with graded compression, as well as color Doppler and duplex ultrasound were performed to evaluate the lower extremity deep venous systems from the level of the common femoral vein and including the common femoral, femoral, profunda femoral, popliteal and calf veins including the posterior tibial, peroneal and gastrocnemius veins when visible. The superficial great saphenous vein was also interrogated. Spectral Doppler was utilized to evaluate flow at rest and with distal augmentation maneuvers in the common femoral, femoral and popliteal  veins.  COMPARISON:  None.  FINDINGS: RIGHT LOWER EXTREMITY  Common Femoral Vein: No evidence of thrombus. Normal compressibility, respiratory phasicity and response to augmentation.  Saphenofemoral Junction: No evidence of thrombus. Normal compressibility and flow on color Doppler imaging.  Profunda Femoral Vein: No evidence of thrombus. Normal compressibility and flow on color Doppler imaging.  Femoral Vein: No evidence of thrombus. Normal compressibility, respiratory phasicity and response to augmentation.  Popliteal Vein: No evidence of thrombus. Normal compressibility, respiratory phasicity and response to augmentation.  Calf Veins:  No evidence of thrombus.  LEFT LOWER EXTREMITY  Common Femoral Vein: No evidence of thrombus. Normal compressibility, respiratory phasicity and response to augmentation.  Saphenofemoral Junction: No evidence of thrombus. Normal compressibility and flow on color Doppler imaging.  Profunda Femoral Vein: No evidence of thrombus. Normal compressibility and flow on color Doppler imaging.  Femoral Vein: No evidence of thrombus. Normal compressibility, respiratory phasicity and response to augmentation.  Popliteal Vein: No evidence of thrombus. Normal compressibility, respiratory phasicity and response to augmentation.  Calf Veins: No evidence of thrombus.  IMPRESSION: No evidence of deep venous thrombosis.   Electronically Signed   By: Skipper Cliche M.D.   On: 12/26/2013 10:58    Lab Results: Basic Metabolic Panel:  Recent Labs  12/26/13 0114 12/26/13 0810  NA 134* 136*  K 4.1 4.2  CL 93* 98  CO2 28 26  GLUCOSE 265* 241*  BUN 29* 24*  CREATININE 1.74* 1.39*  CALCIUM 9.9 8.5   Liver Function Tests:  Recent Labs  12/26/13 0114  AST 19  ALT 19  ALKPHOS 180*  BILITOT 0.2*  PROT 7.6  ALBUMIN 3.9     CBC:  Recent Labs  12/26/13 0114 12/27/13 0546  WBC 8.4 7.7  NEUTROABS 4.9  --   HGB 12.1 10.7*  HCT 34.5* 31.2*  MCV 92.0 94.3  PLT 328 282    No  results found for this or any previous visit (from the past 240 hour(s)).   Hospital Course: This is a 63 year old who has multiple medical problems as listed above. She came to the emergency department because of chest pain. The chest pain was pleuritic. She is high risk for pulmonary embolism but her renal function was not good enough to allow a CT chest. She underwent ventilation perfusion lung scanning which showed no evidence of pulmonary embolism. She was treated with tramadol for pain and improved. She had been started on heparin drip which was discontinued when the ventilation/perfusion lung scan came back. She had an abnormal urinalysis with a possible urinary tract infection so she's going to be treated empirically for that until culture is available  Discharge Exam: Blood pressure 110/67, pulse 98, temperature 97.9 F (36.6 C), temperature source Oral, resp. rate 18, height 5\' 5"  (1.651 m), weight 56.7 kg (125 lb), SpO2 98.00%. She is awake and alert. She is in no distress. Her chest is  Disposition: Home return to my office in about 2 weeks      Discharge Instructions   Discharge patient    Complete by:  As directed              Signed: Kaidence Callaway L   12/27/2013, 8:16 AM

## 2013-12-27 NOTE — Progress Notes (Signed)
Subjective: She says she's better. She initially says that she does not have any pain at all. After I told her that she can probably go home she told her nurse that she is having some chest pain. She only received minimal amounts of pain medication yesterday. Her ventilation/perfusion lung scan did not show pulmonary emboli. However her urinalysis shows multiple bacteria and multiple white blood cells  Objective: Vital signs in last 24 hours: Temp:  [97.9 F (36.6 C)-98.3 F (36.8 C)] 97.9 F (36.6 C) (09/13 0657) Pulse Rate:  [95-102] 98 (09/13 0657) Resp:  [18] 18 (09/12 2138) BP: (110-130)/(55-70) 110/67 mmHg (09/13 0657) SpO2:  [98 %-99 %] 98 % (09/13 0657) Weight change:  Last BM Date: 12/25/13  Intake/Output from previous day: 09/12 0701 - 09/13 0700 In: 1752.5 [P.O.:480; I.V.:1272.5] Out: 1200 [Urine:1200]  PHYSICAL EXAM General appearance: alert, cooperative and mild distress Resp: rhonchi bilaterally Cardio: regular rate and rhythm, S1, S2 normal, no murmur, click, rub or gallop GI: soft, non-tender; bowel sounds normal; no masses,  no organomegaly Extremities: extremities normal, atraumatic, no cyanosis or edema  Lab Results:  Results for orders placed during the hospital encounter of 12/25/13 (from the past 48 hour(s))  CBC WITH DIFFERENTIAL     Status: Abnormal   Collection Time    12/26/13  1:14 AM      Result Value Ref Range   WBC 8.4  4.0 - 10.5 K/uL   RBC 3.75 (*) 3.87 - 5.11 MIL/uL   Hemoglobin 12.1  12.0 - 15.0 g/dL   HCT 34.5 (*) 36.0 - 46.0 %   MCV 92.0  78.0 - 100.0 fL   MCH 32.3  26.0 - 34.0 pg   MCHC 35.1  30.0 - 36.0 g/dL   RDW 12.8  11.5 - 15.5 %   Platelets 328  150 - 400 K/uL   Neutrophils Relative % 58  43 - 77 %   Neutro Abs 4.9  1.7 - 7.7 K/uL   Lymphocytes Relative 29  12 - 46 %   Lymphs Abs 2.4  0.7 - 4.0 K/uL   Monocytes Relative 11  3 - 12 %   Monocytes Absolute 0.9  0.1 - 1.0 K/uL   Eosinophils Relative 2  0 - 5 %   Eosinophils  Absolute 0.1  0.0 - 0.7 K/uL   Basophils Relative 0  0 - 1 %   Basophils Absolute 0.0  0.0 - 0.1 K/uL  COMPREHENSIVE METABOLIC PANEL     Status: Abnormal   Collection Time    12/26/13  1:14 AM      Result Value Ref Range   Sodium 134 (*) 137 - 147 mEq/L   Potassium 4.1  3.7 - 5.3 mEq/L   Chloride 93 (*) 96 - 112 mEq/L   CO2 28  19 - 32 mEq/L   Glucose, Bld 265 (*) 70 - 99 mg/dL   BUN 29 (*) 6 - 23 mg/dL   Creatinine, Ser 1.74 (*) 0.50 - 1.10 mg/dL   Calcium 9.9  8.4 - 10.5 mg/dL   Total Protein 7.6  6.0 - 8.3 g/dL   Albumin 3.9  3.5 - 5.2 g/dL   AST 19  0 - 37 U/L   ALT 19  0 - 35 U/L   Alkaline Phosphatase 180 (*) 39 - 117 U/L   Total Bilirubin 0.2 (*) 0.3 - 1.2 mg/dL   GFR calc non Af Amer 30 (*) >90 mL/min   GFR calc Af Amer 35 (*) >  90 mL/min   Comment: (NOTE)     The eGFR has been calculated using the CKD EPI equation.     This calculation has not been validated in all clinical situations.     eGFR's persistently <90 mL/min signify possible Chronic Kidney     Disease.   Anion gap 13  5 - 15  LIPASE, BLOOD     Status: None   Collection Time    12/26/13  1:14 AM      Result Value Ref Range   Lipase 43  11 - 59 U/L  TROPONIN I     Status: None   Collection Time    12/26/13  1:14 AM      Result Value Ref Range   Troponin I <0.30  <0.30 ng/mL   Comment:            Due to the release kinetics of cTnI,     a negative result within the first hours     of the onset of symptoms does not rule out     myocardial infarction with certainty.     If myocardial infarction is still suspected,     repeat the test at appropriate intervals.  D-DIMER, QUANTITATIVE     Status: Abnormal   Collection Time    12/26/13  1:14 AM      Result Value Ref Range   D-Dimer, Quant 0.67 (*) 0.00 - 0.48 ug/mL-FEU   Comment:            AT THE INHOUSE ESTABLISHED CUTOFF     VALUE OF 0.48 ug/mL FEU,     THIS ASSAY HAS BEEN DOCUMENTED     IN THE LITERATURE TO HAVE     A SENSITIVITY AND NEGATIVE      PREDICTIVE VALUE OF AT LEAST     98 TO 99%.  THE TEST RESULT     SHOULD BE CORRELATED WITH     AN ASSESSMENT OF THE CLINICAL     PROBABILITY OF DVT / VTE.  URINALYSIS, ROUTINE W REFLEX MICROSCOPIC     Status: Abnormal   Collection Time    12/26/13  3:55 AM      Result Value Ref Range   Color, Urine YELLOW  YELLOW   APPearance HAZY (*) CLEAR   Specific Gravity, Urine 1.010  1.005 - 1.030   pH 6.0  5.0 - 8.0   Glucose, UA 500 (*) NEGATIVE mg/dL   Hgb urine dipstick TRACE (*) NEGATIVE   Bilirubin Urine NEGATIVE  NEGATIVE   Ketones, ur NEGATIVE  NEGATIVE mg/dL   Protein, ur NEGATIVE  NEGATIVE mg/dL   Urobilinogen, UA 0.2  0.0 - 1.0 mg/dL   Nitrite NEGATIVE  NEGATIVE   Leukocytes, UA MODERATE (*) NEGATIVE  URINE MICROSCOPIC-ADD ON     Status: Abnormal   Collection Time    12/26/13  3:55 AM      Result Value Ref Range   Squamous Epithelial / LPF RARE  RARE   WBC, UA TOO NUMEROUS TO COUNT  <3 WBC/hpf   RBC / HPF 3-6  <3 RBC/hpf   Bacteria, UA MANY (*) RARE  GLUCOSE, CAPILLARY     Status: Abnormal   Collection Time    12/26/13  7:29 AM      Result Value Ref Range   Glucose-Capillary 271 (*) 70 - 99 mg/dL   Comment 1 Documented in Chart     Comment 2 Notify RN    BASIC METABOLIC PANEL  Status: Abnormal   Collection Time    12/26/13  8:10 AM      Result Value Ref Range   Sodium 136 (*) 137 - 147 mEq/L   Potassium 4.2  3.7 - 5.3 mEq/L   Chloride 98  96 - 112 mEq/L   CO2 26  19 - 32 mEq/L   Glucose, Bld 241 (*) 70 - 99 mg/dL   BUN 24 (*) 6 - 23 mg/dL   Creatinine, Ser 1.39 (*) 0.50 - 1.10 mg/dL   Calcium 8.5  8.4 - 10.5 mg/dL   GFR calc non Af Amer 40 (*) >90 mL/min   GFR calc Af Amer 46 (*) >90 mL/min   Comment: (NOTE)     The eGFR has been calculated using the CKD EPI equation.     This calculation has not been validated in all clinical situations.     eGFR's persistently <90 mL/min signify possible Chronic Kidney     Disease.   Anion gap 12  5 - 15  TROPONIN I      Status: None   Collection Time    12/26/13  8:10 AM      Result Value Ref Range   Troponin I <0.30  <0.30 ng/mL   Comment:            Due to the release kinetics of cTnI,     a negative result within the first hours     of the onset of symptoms does not rule out     myocardial infarction with certainty.     If myocardial infarction is still suspected,     repeat the test at appropriate intervals.  GLUCOSE, CAPILLARY     Status: Abnormal   Collection Time    12/26/13 11:06 AM      Result Value Ref Range   Glucose-Capillary 244 (*) 70 - 99 mg/dL   Comment 1 Documented in Chart     Comment 2 Notify RN    TROPONIN I     Status: None   Collection Time    12/26/13  4:10 PM      Result Value Ref Range   Troponin I <0.30  <0.30 ng/mL   Comment:            Due to the release kinetics of cTnI,     a negative result within the first hours     of the onset of symptoms does not rule out     myocardial infarction with certainty.     If myocardial infarction is still suspected,     repeat the test at appropriate intervals.  GLUCOSE, CAPILLARY     Status: Abnormal   Collection Time    12/26/13  4:39 PM      Result Value Ref Range   Glucose-Capillary 237 (*) 70 - 99 mg/dL   Comment 1 Notify RN    GLUCOSE, CAPILLARY     Status: Abnormal   Collection Time    12/26/13  9:34 PM      Result Value Ref Range   Glucose-Capillary 219 (*) 70 - 99 mg/dL  TROPONIN I     Status: None   Collection Time    12/26/13  9:50 PM      Result Value Ref Range   Troponin I <0.30  <0.30 ng/mL   Comment:            Due to the release kinetics of cTnI,     a negative result within  the first hours     of the onset of symptoms does not rule out     myocardial infarction with certainty.     If myocardial infarction is still suspected,     repeat the test at appropriate intervals.  CBC     Status: Abnormal   Collection Time    12/27/13  5:46 AM      Result Value Ref Range   WBC 7.7  4.0 - 10.5 K/uL   RBC  3.31 (*) 3.87 - 5.11 MIL/uL   Hemoglobin 10.7 (*) 12.0 - 15.0 g/dL   HCT 31.2 (*) 36.0 - 46.0 %   MCV 94.3  78.0 - 100.0 fL   MCH 32.3  26.0 - 34.0 pg   MCHC 34.3  30.0 - 36.0 g/dL   RDW 13.1  11.5 - 15.5 %   Platelets 282  150 - 400 K/uL  GLUCOSE, CAPILLARY     Status: Abnormal   Collection Time    12/27/13  7:26 AM      Result Value Ref Range   Glucose-Capillary 182 (*) 70 - 99 mg/dL   Comment 1 Documented in Chart     Comment 2 Notify RN      ABGS No results found for this basename: PHART, PCO2, PO2ART, TCO2, HCO3,  in the last 72 hours CULTURES No results found for this or any previous visit (from the past 240 hour(s)). Studies/Results: Dg Chest 2 View  12/26/2013   CLINICAL DATA:  Chest and flank pain.  EXAM: CHEST  2 VIEW  COMPARISON:  11/01/2012.  FINDINGS: The power port is stable. The cardiac silhouette, mediastinal and hilar contours are within normal limits and stable. There is tortuosity, calcification an ectasia of the thoracic aorta. The lungs are hyperinflated. Pulmonary nodules are again noted consistent with known metastatic disease. Bibasilar atelectasis but no infiltrates or effusions. The bony thorax is intact.  IMPRESSION: Small bilateral pulmonary nodules consistent with metastatic disease.  Chronic lung changes but no definite acute pulmonary infiltrates.   Electronically Signed   By: Kalman Jewels M.D.   On: 12/26/2013 01:11   Nm Pulmonary Perf And Vent  12/26/2013   CLINICAL DATA:  Left-sided chest pain  EXAM: NUCLEAR MEDICINE VENTILATION - PERFUSION LUNG SCAN  TECHNIQUE: Ventilation images were obtained in multiple projections using inhaled aerosol technetium 99 M DTPA. Perfusion images were obtained in multiple projections after intravenous injection of Tc-75mMAA.  RADIOPHARMACEUTICALS:  Forty-four mCi Tc-975mTPA aerosol and 6 mCi Tc-9973mA  COMPARISON:  Chest radiograph 12/26/2013  FINDINGS: Ventilation: No focal ventilation defect.  Perfusion: No wedge  shaped peripheral perfusion defects to suggest acute pulmonary embolism.  IMPRESSION: No evidence of pulmonary embolism.   Electronically Signed   By: RaySkipper ClicheD.   On: 12/26/2013 12:54   Us Koreanous Img Lower Bilateral  12/26/2013   CLINICAL DATA:  Chest pain  EXAM: BILATERAL LOWER EXTREMITY VENOUS DOPPLER ULTRASOUND  TECHNIQUE: Gray-scale sonography with graded compression, as well as color Doppler and duplex ultrasound were performed to evaluate the lower extremity deep venous systems from the level of the common femoral vein and including the common femoral, femoral, profunda femoral, popliteal and calf veins including the posterior tibial, peroneal and gastrocnemius veins when visible. The superficial great saphenous vein was also interrogated. Spectral Doppler was utilized to evaluate flow at rest and with distal augmentation maneuvers in the common femoral, femoral and popliteal veins.  COMPARISON:  None.  FINDINGS: RIGHT LOWER EXTREMITY  Common Femoral Vein: No evidence of thrombus. Normal compressibility, respiratory phasicity and response to augmentation.  Saphenofemoral Junction: No evidence of thrombus. Normal compressibility and flow on color Doppler imaging.  Profunda Femoral Vein: No evidence of thrombus. Normal compressibility and flow on color Doppler imaging.  Femoral Vein: No evidence of thrombus. Normal compressibility, respiratory phasicity and response to augmentation.  Popliteal Vein: No evidence of thrombus. Normal compressibility, respiratory phasicity and response to augmentation.  Calf Veins: No evidence of thrombus.  LEFT LOWER EXTREMITY  Common Femoral Vein: No evidence of thrombus. Normal compressibility, respiratory phasicity and response to augmentation.  Saphenofemoral Junction: No evidence of thrombus. Normal compressibility and flow on color Doppler imaging.  Profunda Femoral Vein: No evidence of thrombus. Normal compressibility and flow on color Doppler imaging.  Femoral  Vein: No evidence of thrombus. Normal compressibility, respiratory phasicity and response to augmentation.  Popliteal Vein: No evidence of thrombus. Normal compressibility, respiratory phasicity and response to augmentation.  Calf Veins: No evidence of thrombus.  IMPRESSION: No evidence of deep venous thrombosis.   Electronically Signed   By: Skipper Cliche M.D.   On: 12/26/2013 10:58    Medications:  Prior to Admission:  Prescriptions prior to admission  Medication Sig Dispense Refill  . aspirin 81 MG chewable tablet Chew 1 tablet (81 mg total) by mouth daily.      Marland Kitchen atorvastatin (LIPITOR) 40 MG tablet Take 40 mg by mouth at bedtime.      . benzonatate (TESSALON) 100 MG capsule Take by mouth 3 (three) times daily as needed for cough.      . denosumab (PROLIA) 60 MG/ML SOLN injection Inject 60 mg into the skin every 6 (six) months. Administer in upper arm, thigh, or abdomen      . digoxin (LANOXIN) 0.125 MG tablet Take 0.5 tablets (0.0625 mg total) by mouth daily.  15 tablet  6  . docusate sodium (COLACE) 100 MG capsule Take 100 mg by mouth at bedtime.      . fluPHENAZine decanoate (PROLIXIN) 25 MG/ML injection Inject 25 mg into the muscle every 14 (fourteen) days.      . furosemide (LASIX) 40 MG tablet Take 1 tablet (40 mg total) by mouth daily.  30 tablet    . glipiZIDE (GLUCOTROL) 5 MG tablet Take 5 mg by mouth 2 (two) times daily before a meal.      . heparin lock flush 100 UNIT/ML SOLN 5 mLs (500 Units total) by Intracatheter route every 30 (thirty) days.      . insulin detemir (LEVEMIR) 100 UNIT/ML injection Inject 40 Units into the skin daily.       Marland Kitchen lisinopril (PRINIVIL,ZESTRIL) 2.5 MG tablet Take 1/2 tab in AM and 1/2 tab in PM      . loratadine (CLARITIN) 10 MG tablet Take 10 mg by mouth daily.      Marland Kitchen loxapine (LOXITANE) 10 MG capsule Take 10 mg by mouth at bedtime.      . magnesium oxide (MAG-OX) 400 (241.3 MG) MG tablet TAKE (1) TABLET TWICE DAILY.  60 tablet  6  . nortriptyline  (PAMELOR) 50 MG capsule Take 50 mg by mouth at bedtime.       Marland Kitchen omeprazole (PRILOSEC) 20 MG capsule Take 20 mg by mouth daily.      . polyvinyl alcohol (LIQUIFILM TEARS) 1.4 % ophthalmic solution Place 1 drop into both eyes 2 (two) times daily as needed.  15 mL  0  . potassium chloride SA (K-DUR,KLOR-CON) 20  MEQ tablet Take 1 tablet (20 mEq total) by mouth daily.  30 tablet  6  . Propylene Glycol (SYSTANE BALANCE) 0.6 % SOLN Apply 1 drop to eye 2 (two) times daily as needed (dry eyes).      . ramelteon (ROZEREM) 8 MG tablet Take 8 mg by mouth at bedtime as needed for sleep.       Marland Kitchen spironolactone (ALDACTONE) 25 MG tablet Take 1 tablet (25 mg total) by mouth daily.  30 tablet  6  . temazepam (RESTORIL) 15 MG capsule Take 15 mg by mouth at bedtime as needed for sleep.      . traZODone (DESYREL) 150 MG tablet Take 150 mg by mouth at bedtime.       Scheduled: . aspirin  81 mg Oral Daily  . atorvastatin  40 mg Oral QHS  . digoxin  0.0625 mg Oral Daily  . docusate sodium  100 mg Oral QHS  . insulin aspart  0-9 Units Subcutaneous TID WC  . insulin detemir  36 Units Subcutaneous Daily  . loratadine  10 mg Oral Daily  . loxapine  10 mg Oral QHS  . nortriptyline  50 mg Oral QHS  . pantoprazole  40 mg Oral Daily  . sodium chloride  3 mL Intravenous Q12H  . traZODone  150 mg Oral QHS   Continuous: . sodium chloride 50 mL/hr at 12/27/13 0542   [id number] alcohol, temazepam, traMADol  Assesment: She was admitted with pleuritic chest pain and has ruled out for pulmonary emboli. Tramadol has been effective for her pain. She may have a urinary tract infection and I'm going to get a urine culture and treat her for UTI. I think she's ready for discharge Active Problems:   Schizophrenia   Hyperlipidemia   Breast cancer, right breast   Congestive dilated cardiomyopathy   CAD (coronary artery disease)   Chronic systolic CHF (congestive heart failure)   Diabetes   Pleuritic chest pain   Chest  pain    Plan: Discharge home today    LOS: 2 days   Andrey Hoobler L 12/27/2013, 8:03 AM

## 2013-12-27 NOTE — Progress Notes (Signed)
Pt is to be discharged home today. Pt is in NAD, IV is out, all paperwork has been reviewed/discussed with patient, and there are no questions/concerns at this time. Assessment is unchanged from this morning. Pt is to be accompanied downstairs by staff and family via wheelchair.  

## 2013-12-28 ENCOUNTER — Ambulatory Visit (HOSPITAL_COMMUNITY): Admission: RE | Admit: 2013-12-28 | Payer: Medicare HMO | Source: Ambulatory Visit

## 2013-12-31 LAB — URINE CULTURE: Colony Count: >=100000

## 2014-01-04 ENCOUNTER — Encounter (HOSPITAL_COMMUNITY): Admission: RE | Admit: 2014-01-04 | Payer: Medicare HMO | Source: Ambulatory Visit

## 2014-01-04 NOTE — Progress Notes (Signed)
UR chart review completed.  

## 2014-01-08 ENCOUNTER — Encounter (HOSPITAL_COMMUNITY): Payer: Self-pay

## 2014-01-08 ENCOUNTER — Encounter (HOSPITAL_COMMUNITY)
Admission: RE | Admit: 2014-01-08 | Discharge: 2014-01-08 | Disposition: A | Discharge status: Home / Self Care | Payer: Medicare HMO | Source: Ambulatory Visit | Attending: Diagnostic Radiology | Admitting: Diagnostic Radiology

## 2014-01-08 DIAGNOSIS — C50911 Malignant neoplasm of unspecified site of right female breast: Secondary | ICD-10-CM | POA: Insufficient documentation

## 2014-01-08 DIAGNOSIS — C7800 Secondary malignant neoplasm of unspecified lung: Secondary | ICD-10-CM | POA: Insufficient documentation

## 2014-01-08 DIAGNOSIS — C50919 Malignant neoplasm of unspecified site of unspecified female breast: Secondary | ICD-10-CM | POA: Diagnosis present

## 2014-01-08 DIAGNOSIS — C7951 Secondary malignant neoplasm of bone: Secondary | ICD-10-CM | POA: Diagnosis not present

## 2014-01-08 DIAGNOSIS — C7981 Secondary malignant neoplasm of breast: Secondary | ICD-10-CM | POA: Diagnosis not present

## 2014-01-08 LAB — GLUCOSE, CAPILLARY: GLUCOSE-CAPILLARY: 225 mg/dL — AB (ref 70–99)

## 2014-01-08 MED ORDER — FLUDEOXYGLUCOSE F - 18 (FDG) INJECTION
6.1700 | Freq: Once | INTRAVENOUS | Status: AC | PRN
  Administered 2014-01-08: 6.17 via INTRAVENOUS

## 2014-01-18 ENCOUNTER — Inpatient Hospital Stay (HOSPITAL_COMMUNITY)
Admission: EM | Admit: 2014-01-18 | Discharge: 2014-01-21 | DRG: 054 | Disposition: A | Discharge status: Hospice | Payer: Medicare HMO | Attending: Pulmonary Disease | Admitting: Pulmonary Disease

## 2014-01-18 ENCOUNTER — Emergency Department (HOSPITAL_COMMUNITY): Payer: Medicare HMO

## 2014-01-18 ENCOUNTER — Encounter (HOSPITAL_COMMUNITY): Payer: Self-pay | Admitting: Emergency Medicine

## 2014-01-18 DIAGNOSIS — C7800 Secondary malignant neoplasm of unspecified lung: Secondary | ICD-10-CM | POA: Diagnosis not present

## 2014-01-18 DIAGNOSIS — F319 Bipolar disorder, unspecified: Secondary | ICD-10-CM | POA: Diagnosis present

## 2014-01-18 DIAGNOSIS — Z79899 Other long term (current) drug therapy: Secondary | ICD-10-CM | POA: Diagnosis not present

## 2014-01-18 DIAGNOSIS — Z794 Long term (current) use of insulin: Secondary | ICD-10-CM

## 2014-01-18 DIAGNOSIS — C7951 Secondary malignant neoplasm of bone: Secondary | ICD-10-CM | POA: Diagnosis not present

## 2014-01-18 DIAGNOSIS — C7931 Secondary malignant neoplasm of brain: Principal | ICD-10-CM | POA: Diagnosis present

## 2014-01-18 DIAGNOSIS — J449 Chronic obstructive pulmonary disease, unspecified: Secondary | ICD-10-CM | POA: Diagnosis present

## 2014-01-18 DIAGNOSIS — Z7401 Bed confinement status: Secondary | ICD-10-CM

## 2014-01-18 DIAGNOSIS — Z8673 Personal history of transient ischemic attack (TIA), and cerebral infarction without residual deficits: Secondary | ICD-10-CM

## 2014-01-18 DIAGNOSIS — C7802 Secondary malignant neoplasm of left lung: Secondary | ICD-10-CM | POA: Diagnosis present

## 2014-01-18 DIAGNOSIS — Z833 Family history of diabetes mellitus: Secondary | ICD-10-CM

## 2014-01-18 DIAGNOSIS — Z66 Do not resuscitate: Secondary | ICD-10-CM | POA: Diagnosis present

## 2014-01-18 DIAGNOSIS — E785 Hyperlipidemia, unspecified: Secondary | ICD-10-CM | POA: Diagnosis not present

## 2014-01-18 DIAGNOSIS — R4182 Altered mental status, unspecified: Secondary | ICD-10-CM | POA: Diagnosis present

## 2014-01-18 DIAGNOSIS — I5022 Chronic systolic (congestive) heart failure: Secondary | ICD-10-CM | POA: Diagnosis not present

## 2014-01-18 DIAGNOSIS — Z825 Family history of asthma and other chronic lower respiratory diseases: Secondary | ICD-10-CM | POA: Diagnosis not present

## 2014-01-18 DIAGNOSIS — I679 Cerebrovascular disease, unspecified: Secondary | ICD-10-CM | POA: Diagnosis present

## 2014-01-18 DIAGNOSIS — Z7982 Long term (current) use of aspirin: Secondary | ICD-10-CM

## 2014-01-18 DIAGNOSIS — Z6820 Body mass index (BMI) 20.0-20.9, adult: Secondary | ICD-10-CM | POA: Diagnosis not present

## 2014-01-18 DIAGNOSIS — K219 Gastro-esophageal reflux disease without esophagitis: Secondary | ICD-10-CM | POA: Diagnosis not present

## 2014-01-18 DIAGNOSIS — Z803 Family history of malignant neoplasm of breast: Secondary | ICD-10-CM

## 2014-01-18 DIAGNOSIS — E43 Unspecified severe protein-calorie malnutrition: Secondary | ICD-10-CM | POA: Diagnosis present

## 2014-01-18 DIAGNOSIS — C50911 Malignant neoplasm of unspecified site of right female breast: Secondary | ICD-10-CM | POA: Diagnosis present

## 2014-01-18 DIAGNOSIS — Z8249 Family history of ischemic heart disease and other diseases of the circulatory system: Secondary | ICD-10-CM | POA: Diagnosis not present

## 2014-01-18 DIAGNOSIS — Z23 Encounter for immunization: Secondary | ICD-10-CM | POA: Diagnosis not present

## 2014-01-18 DIAGNOSIS — Z9221 Personal history of antineoplastic chemotherapy: Secondary | ICD-10-CM | POA: Diagnosis not present

## 2014-01-18 DIAGNOSIS — C787 Secondary malignant neoplasm of liver and intrahepatic bile duct: Secondary | ICD-10-CM | POA: Diagnosis not present

## 2014-01-18 DIAGNOSIS — E119 Type 2 diabetes mellitus without complications: Secondary | ICD-10-CM | POA: Diagnosis not present

## 2014-01-18 DIAGNOSIS — E1165 Type 2 diabetes mellitus with hyperglycemia: Secondary | ICD-10-CM | POA: Diagnosis present

## 2014-01-18 DIAGNOSIS — I2510 Atherosclerotic heart disease of native coronary artery without angina pectoris: Secondary | ICD-10-CM | POA: Diagnosis not present

## 2014-01-18 DIAGNOSIS — I420 Dilated cardiomyopathy: Secondary | ICD-10-CM | POA: Diagnosis not present

## 2014-01-18 DIAGNOSIS — Z87891 Personal history of nicotine dependence: Secondary | ICD-10-CM | POA: Diagnosis not present

## 2014-01-18 DIAGNOSIS — C7801 Secondary malignant neoplasm of right lung: Secondary | ICD-10-CM | POA: Diagnosis present

## 2014-01-18 DIAGNOSIS — R4781 Slurred speech: Secondary | ICD-10-CM

## 2014-01-18 DIAGNOSIS — F209 Schizophrenia, unspecified: Secondary | ICD-10-CM | POA: Diagnosis not present

## 2014-01-18 DIAGNOSIS — C50919 Malignant neoplasm of unspecified site of unspecified female breast: Secondary | ICD-10-CM | POA: Diagnosis present

## 2014-01-18 LAB — CBC WITH DIFFERENTIAL/PLATELET
Basophils Absolute: 0 10*3/uL (ref 0.0–0.1)
Basophils Relative: 0 % (ref 0–1)
Eosinophils Absolute: 0 10*3/uL (ref 0.0–0.7)
Eosinophils Relative: 0 % (ref 0–5)
HCT: 38.2 % (ref 36.0–46.0)
Hemoglobin: 13.2 g/dL (ref 12.0–15.0)
Lymphocytes Relative: 15 % (ref 12–46)
Lymphs Abs: 1.3 10*3/uL (ref 0.7–4.0)
MCH: 32 pg (ref 26.0–34.0)
MCHC: 34.6 g/dL (ref 30.0–36.0)
MCV: 92.5 fL (ref 78.0–100.0)
Monocytes Absolute: 0.5 10*3/uL (ref 0.1–1.0)
Monocytes Relative: 5 % (ref 3–12)
Neutro Abs: 7 10*3/uL (ref 1.7–7.7)
Neutrophils Relative %: 80 % — ABNORMAL HIGH (ref 43–77)
Platelets: 310 10*3/uL (ref 150–400)
RBC: 4.13 MIL/uL (ref 3.87–5.11)
RDW: 12.5 % (ref 11.5–15.5)
WBC: 8.8 10*3/uL (ref 4.0–10.5)

## 2014-01-18 LAB — URINALYSIS, ROUTINE W REFLEX MICROSCOPIC
Bilirubin Urine: NEGATIVE
Glucose, UA: NEGATIVE mg/dL
Hgb urine dipstick: NEGATIVE
KETONES UR: NEGATIVE mg/dL
LEUKOCYTES UA: NEGATIVE
NITRITE: NEGATIVE
PROTEIN: NEGATIVE mg/dL
Specific Gravity, Urine: 1.01 (ref 1.005–1.030)
Urobilinogen, UA: 0.2 mg/dL (ref 0.0–1.0)
pH: 7.5 (ref 5.0–8.0)

## 2014-01-18 LAB — COMPREHENSIVE METABOLIC PANEL
ALT: 35 U/L (ref 0–35)
ANION GAP: 15 (ref 5–15)
AST: 34 U/L (ref 0–37)
Albumin: 4 g/dL (ref 3.5–5.2)
Alkaline Phosphatase: 199 U/L — ABNORMAL HIGH (ref 39–117)
BILIRUBIN TOTAL: 0.3 mg/dL (ref 0.3–1.2)
BUN: 25 mg/dL — AB (ref 6–23)
CALCIUM: 10.2 mg/dL (ref 8.4–10.5)
CHLORIDE: 90 meq/L — AB (ref 96–112)
CO2: 29 mEq/L (ref 19–32)
CREATININE: 1.57 mg/dL — AB (ref 0.50–1.10)
GFR calc Af Amer: 40 mL/min — ABNORMAL LOW (ref >90)
GFR, EST NON AFRICAN AMERICAN: 34 mL/min — AB (ref >90)
Glucose, Bld: 403 mg/dL — ABNORMAL HIGH (ref 70–99)
Potassium: 4.6 mEq/L (ref 3.7–5.3)
Sodium: 134 mEq/L — ABNORMAL LOW (ref 137–147)
Total Protein: 8.4 g/dL — ABNORMAL HIGH (ref 6.0–8.3)

## 2014-01-18 LAB — CBG MONITORING, ED: Glucose-Capillary: 215 mg/dL — ABNORMAL HIGH (ref 70–99)

## 2014-01-18 MED ORDER — ONDANSETRON HCL 4 MG/2ML IJ SOLN
4.0000 mg | Freq: Once | INTRAMUSCULAR | Status: AC
  Administered 2014-01-18: 4 mg via INTRAVENOUS
  Filled 2014-01-18: qty 2

## 2014-01-18 MED ORDER — SODIUM CHLORIDE 0.9 % IV SOLN: INTRAVENOUS | Status: DC

## 2014-01-18 MED ORDER — GADOBENATE DIMEGLUMINE 529 MG/ML IV SOLN
6.0000 mL | Freq: Once | INTRAVENOUS | Status: AC | PRN
  Administered 2014-01-18: 6 mL via INTRAVENOUS

## 2014-01-18 MED ORDER — DEXAMETHASONE SODIUM PHOSPHATE 4 MG/ML IJ SOLN
10.0000 mg | Freq: Once | INTRAMUSCULAR | Status: AC
  Administered 2014-01-18: 10 mg via INTRAVENOUS
  Filled 2014-01-18: qty 3

## 2014-01-18 MED ORDER — FENTANYL CITRATE 0.05 MG/ML IJ SOLN
50.0000 ug | Freq: Once | INTRAMUSCULAR | Status: AC
  Administered 2014-01-18: 50 ug via INTRAVENOUS
  Filled 2014-01-18: qty 2

## 2014-01-18 MED ORDER — SODIUM CHLORIDE 0.9 % IV SOLN
INTRAVENOUS | Status: DC
  Filled 2014-01-18: qty 2.5

## 2014-01-18 MED ORDER — LORAZEPAM 2 MG/ML IJ SOLN
1.0000 mg | Freq: Once | INTRAMUSCULAR | Status: AC
  Administered 2014-01-18: 1 mg via INTRAVENOUS
  Filled 2014-01-18: qty 1

## 2014-01-18 MED ORDER — IOHEXOL 300 MG/ML  SOLN
80.0000 mL | Freq: Once | INTRAMUSCULAR | Status: AC | PRN
  Administered 2014-01-18: 80 mL via INTRAVENOUS

## 2014-01-18 MED ORDER — SODIUM CHLORIDE 0.9 % IV SOLN
INTRAVENOUS | Status: DC
  Administered 2014-01-18: 21:00:00 via INTRAVENOUS

## 2014-01-18 NOTE — H&P (Signed)
[facility] History and Physical  [name] LAG:[id number] DOB: 22-Dec-1950 DOA: 01/18/2014   PCP: Alonza Bogus, MD  Specialists: She is followed by Dr. Tressie Stalker, Oncologist at [facility] in [place]. Has also been followed by Dr. Aundra Dubin, with cardiology.  Chief Complaint: Abdominal pain, and slurred speech  HPI: [name] is a 63 y.o. female with a past medical history of metastatic breast cancer currently not on treatment, history of cardiomyopathy, with EF of 20-30% due to chemotherapy, diabetes on insulin, history of schizophrenia, who was in her usual state of health till last day or so. When she started developing abdominal pain in the upper abdomen. She is accompanied by her sisters who provide most of the history. Patient has a history of acid reflux and has had a lot of reflux today. And, then they also mention, that her speech was slurred today. Patient has poor functional status at baseline requiring a lot of assistance with ambulation. Patient is unable to provide any history and history is very limited. She hasn't seen her oncologist in a couple of months. She is supposed to have an appointment with him in the next day or so.  Home Medications: Prior to Admission medications   Medication Sig Start Date End Date Taking? Authorizing Provider  aspirin 81 MG chewable tablet Chew 1 tablet (81 mg total) by mouth daily. 11/07/12  Yes Cherene Altes, MD  atorvastatin (LIPITOR) 40 MG tablet Take 40 mg by mouth at bedtime.   Yes Historical Provider, MD  digoxin (LANOXIN) 0.125 MG tablet Take 0.5 tablets (0.0625 mg total) by mouth daily. 10/08/13  Yes Rande Brunt, NP  docusate sodium (COLACE) 100 MG capsule Take 100 mg by mouth at bedtime. 02/05/12  Yes Rogene Houston, MD  fluPHENAZine decanoate (PROLIXIN) 25 MG/ML injection Inject 25 mg into the muscle every 14 (fourteen) days.   Yes Historical Provider, MD  furosemide (LASIX) 40 MG tablet Take 1 tablet (40 mg  total) by mouth daily. 11/07/12  Yes Cherene Altes, MD  glipiZIDE (GLUCOTROL) 5 MG tablet Take 5 mg by mouth 2 (two) times daily before a meal. 12/11/12  Yes Amy D Clegg, NP  insulin detemir (LEVEMIR) 100 UNIT/ML injection Inject 40 Units into the skin daily.    Yes Historical Provider, MD  lisinopril (PRINIVIL,ZESTRIL) 2.5 MG tablet Take 1.25 mg by mouth 2 (two) times daily. Take 1/2 tab in AM and 1/2 tab in PM 12/24/12  Yes Jolaine Artist, MD  loratadine (CLARITIN) 10 MG tablet Take 10 mg by mouth daily.   Yes Historical Provider, MD  loxapine (LOXITANE) 10 MG capsule Take 10 mg by mouth at bedtime.   Yes Historical Provider, MD  magnesium oxide (MAG-OX) 400 MG tablet Take 400 mg by mouth daily.   Yes Historical Provider, MD  nortriptyline (PAMELOR) 50 MG capsule Take 50 mg by mouth at bedtime.    Yes Historical Provider, MD  omeprazole (PRILOSEC) 20 MG capsule Take 20 mg by mouth daily.   Yes Historical Provider, MD  polyvinyl alcohol (LIQUIFILM TEARS) 1.4 % ophthalmic solution Place 1 drop into both eyes 2 (two) times daily as needed. 11/07/12  Yes Cherene Altes, MD  potassium chloride SA (K-DUR,KLOR-CON) 20 MEQ tablet Take 1 tablet (20 mEq total) by mouth daily. 12/02/13  Yes Jolaine Artist, MD  Propylene Glycol (SYSTANE BALANCE) 0.6 % SOLN Apply 1 drop to eye 2 (two) times daily as needed (dry eyes).   Yes Historical Provider,  MD  spironolactone (ALDACTONE) 25 MG tablet Take 1 tablet (25 mg total) by mouth daily. 12/02/13  Yes Shaune Pascal Bensimhon, MD  temazepam (RESTORIL) 15 MG capsule Take 15 mg by mouth at bedtime. 01/11/14  Yes Historical Provider, MD  traMADol (ULTRAM) 50 MG tablet Take 1 tablet (50 mg total) by mouth every 6 (six) hours as needed for moderate pain. 12/27/13  Yes Alonza Bogus, MD  traZODone (DESYREL) 150 MG tablet Take 150 mg by mouth at bedtime.   Yes Historical Provider, MD  denosumab (PROLIA) 60 MG/ML SOLN injection Inject 60 mg into the skin every 6 (six)  months. Administer in upper arm, thigh, or abdomen    Historical Provider, MD  heparin lock flush 100 UNIT/ML SOLN 5 mLs (500 Units total) by Intracatheter route every 30 (thirty) days. 11/07/12   Cherene Altes, MD  ramelteon (ROZEREM) 8 MG tablet Take 8 mg by mouth at bedtime as needed for sleep.     Historical Provider, MD    Allergies:  Allergies  Allergen Reactions  . Ibuprofen Nausea Only  . Other Other (See Comments)    Ragweed Pepper - Sneezing    Past Medical History: Past Medical History  Diagnosis Date  . Allergic rhinitis   . Bipolar disorder   . GERD (gastroesophageal reflux disease)   . Hypokalemia   . Hyperlipemia   . COPD (chronic obstructive pulmonary disease)   . Heart palpitations   . Anemia   . 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  . Diabetes mellitus without complication   . Coronary artery disease   . Cancer     breast  . Breast cancer     rt breast. diagnosed in 2013    Past Surgical History  Procedure Laterality Date  . Sterilization    . Colonoscopy    . Upper gastrointestinal endoscopy    . Dilation and curettage of uterus    . Colonoscopy with esophagogastroduodenoscopy (egd)  02/21/2012    Procedure: COLONOSCOPY WITH ESOPHAGOGASTRODUODENOSCOPY (EGD);  Surgeon: Rogene Houston, MD;  Location: [facility];  Service: Endoscopy;  Laterality: N/A;  2:25  . Right breast lumpectomy      benign/Dr. Arnoldo Morale ?1990's  . Breast biopsy  2013    right breast  . Portacath placement  04/01/2012    Procedure: INSERTION PORT-A-CATH;  Surgeon: Scherry Ran, MD;  Location: [facility];  Service: General;  Laterality: Left;  Insertion Port-A-Cath Left Subclavian under Fluoroscopy    Social History: Patient lives with her sisters. Former smoker. No alcohol use. Requires assistance with ambulation.  Family History:  Family History  Problem Relation Age of Onset  . Seizures Mother   . COPD Father   . Hypertension  Sister   . COPD Sister   . Arthritis Sister   . Diabetes Brother   . Hypertension Sister   . Breast cancer Sister     64 year survivor  . Arthritis Sister   . Thyroid disease Sister   . Arthritis Sister   . Diabetes Sister   . Arthritis Sister   . Heart disease Brother   . Healthy Brother   . Healthy Brother   . Healthy Son      Review of Systems - unable to obtain due to her mental status  Physical Examination  Filed Vitals:   01/18/14 2157 01/18/14 2200 01/18/14 2230 01/18/14 2300  BP: 138/82 140/80 121/79 110/71  Pulse: 104  101 103 100  Temp:      TempSrc:      Resp:      Height:      Weight:      SpO2: 97% 95% 96% 95%    BP 110/71  Pulse 100  Temp(Src) 98.1 F (36.7 C) (Oral)  Resp 16  Ht '5\' 5"'  (1.651 m)  Wt 56.7 kg (125 lb)  BMI 20.80 kg/m2  SpO2 95%  General appearance: alert, distracted and no distress Head: Normocephalic, without obvious abnormality, atraumatic Eyes: limited EOM to right Neck: no adenopathy, no carotid bruit, no JVD, supple, symmetrical, trachea midline and thyroid not enlarged, symmetric, no tenderness/mass/nodules Resp: clear to auscultation bilaterally Cardio: regular rate and rhythm, S1, S2 normal, no murmur, click, rub or gallop GI: soft, non-tender; bowel sounds normal; no masses,  no organomegaly Extremities: extremities normal, atraumatic, no cyanosis or edema Pulses: 2+ and symmetric Skin: Skin color, texture, turgor normal. No rashes or lesions Lymph nodes: Cervical, supraclavicular, and axillary nodes normal. Neurologic: No facial symmetry. Strength is nonfocal and appears to be 4/5. Gait not assessed  Laboratory Data: Results for orders placed during the hospital encounter of 01/18/14 (from the past 48 hour(s))  CBC WITH DIFFERENTIAL     Status: Abnormal   Collection Time    01/18/14  3:13 PM      Result Value Ref Range   WBC 8.8  4.0 - 10.5 K/uL   RBC 4.13  3.87 - 5.11 MIL/uL   Hemoglobin 13.2  12.0 - 15.0 g/dL    HCT 38.2  36.0 - 46.0 %   MCV 92.5  78.0 - 100.0 fL   MCH 32.0  26.0 - 34.0 pg   MCHC 34.6  30.0 - 36.0 g/dL   RDW 12.5  11.5 - 15.5 %   Platelets 310  150 - 400 K/uL   Neutrophils Relative % 80 (*) 43 - 77 %   Neutro Abs 7.0  1.7 - 7.7 K/uL   Lymphocytes Relative 15  12 - 46 %   Lymphs Abs 1.3  0.7 - 4.0 K/uL   Monocytes Relative 5  3 - 12 %   Monocytes Absolute 0.5  0.1 - 1.0 K/uL   Eosinophils Relative 0  0 - 5 %   Eosinophils Absolute 0.0  0.0 - 0.7 K/uL   Basophils Relative 0  0 - 1 %   Basophils Absolute 0.0  0.0 - 0.1 K/uL  COMPREHENSIVE METABOLIC PANEL     Status: Abnormal   Collection Time    01/18/14  3:13 PM      Result Value Ref Range   Sodium 134 (*) 137 - 147 mEq/L   Potassium 4.6  3.7 - 5.3 mEq/L   Chloride 90 (*) 96 - 112 mEq/L   CO2 29  19 - 32 mEq/L   Glucose, Bld 403 (*) 70 - 99 mg/dL   BUN 25 (*) 6 - 23 mg/dL   Creatinine, Ser 1.57 (*) 0.50 - 1.10 mg/dL   Calcium 10.2  8.4 - 10.5 mg/dL   Total Protein 8.4 (*) 6.0 - 8.3 g/dL   Albumin 4.0  3.5 - 5.2 g/dL   AST 34  0 - 37 U/L   ALT 35  0 - 35 U/L   Alkaline Phosphatase 199 (*) 39 - 117 U/L   Total Bilirubin 0.3  0.3 - 1.2 mg/dL   GFR calc non Af Amer 34 (*) >90 mL/min   GFR calc Af Amer 40 (*) >  90 mL/min   Comment: (NOTE)     The eGFR has been calculated using the CKD EPI equation.     This calculation has not been validated in all clinical situations.     eGFR's persistently <90 mL/min signify possible Chronic Kidney     Disease.   Anion gap 15  5 - 15  URINALYSIS, ROUTINE W REFLEX MICROSCOPIC     Status: None   Collection Time    01/18/14 10:03 PM      Result Value Ref Range   Color, Urine YELLOW  YELLOW   APPearance CLEAR  CLEAR   Specific Gravity, Urine 1.010  1.005 - 1.030   pH 7.5  5.0 - 8.0   Glucose, UA NEGATIVE  NEGATIVE mg/dL   Hgb urine dipstick NEGATIVE  NEGATIVE   Bilirubin Urine NEGATIVE  NEGATIVE   Ketones, ur NEGATIVE  NEGATIVE mg/dL   Protein, ur NEGATIVE  NEGATIVE mg/dL    Urobilinogen, UA 0.2  0.0 - 1.0 mg/dL   Nitrite NEGATIVE  NEGATIVE   Leukocytes, UA NEGATIVE  NEGATIVE   Comment: MICROSCOPIC NOT DONE ON URINES WITH NEGATIVE PROTEIN, BLOOD, LEUKOCYTES, NITRITE, OR GLUCOSE <1000 mg/dL.  CBG MONITORING, ED     Status: Abnormal   Collection Time    01/18/14 10:12 PM      Result Value Ref Range   Glucose-Capillary 215 (*) 70 - 99 mg/dL    Radiology Reports: Mr Jeri Cos Wo Contrast  01/18/2014   CLINICAL DATA:  Initial evaluation for altered mental status today, weakness, inability to walk. History of breast cancer.  EXAM: MRI HEAD WITHOUT AND WITH CONTRAST  TECHNIQUE: Multiplanar, multiecho pulse sequences of the brain and surrounding structures were obtained without and with intravenous contrast.  CONTRAST:  4m MULTIHANCE GADOBENATE DIMEGLUMINE 529 MG/ML IV SOLN  COMPARISON:  Most recent MRI from 08/10/2009  FINDINGS: Mild diffuse prominence of the CSF containing spaces is compatible with generalized cerebral atrophy. Mild patchy and confluent T2/FLAIR hyperintensity within the periventricular and deep white matter of the bilateral cerebral hemispheres, likely related to remote ischemia. Small remote lacunar infarcts seen within the bilateral basal ganglia, left greater than right, as well as the left subinsular white matter.  No abnormal foci of restricted diffusion are seen to suggest acute intracranial infarct. Gray-white matter differentiation is maintained. Normal flow voids seen within the intracranial vasculature.  An irregular multi cystic T2 hyperintense cystic mass is seen within the central cerebellum, primarily involving the median left cerebellar hemisphere, but crossing midline into the medial right cerebellar hemisphere as well. This lesion measured 2.9 (AP) x 2.6 (transverse) x 2.8 (craniocaudad) cm and demonstrates peripheral rim enhancement (series 11, image 33). Minimal blood breakdown products seen within this lesion. Smaller satellite lesion  measuring 1.0 x 0.8 x 9.0 cm present within the left cerebellar hemisphere (series 10, image 37). There is a third lesion within the left cerebellar hemisphere measuring 1.2 x 0.9 x 1.0 cm. 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 No definite transependymal flow of CSF at this time. The cerebellar tonsils remain normally located above the foramen magnum without inferior herniation.  There are are 2 additional lesions within the high left frontal and parietal lobes measuring 0.8 x 0.6 x 0.9 cm and 0.6 x 0.8 x 0.7 cm respectively (series 11, image 75). Mild localized vasogenic  edema. Additional small metastasis seen within the peripheral left temporal lobe measures 5 mm (series 11, image 30).  No midline shift. No extra-axial fluid collection. No intracranial hemorrhage.  Pituitary gland within normal limits. No acute abnormality seen about either orbit.  Bone marrow signal intensity demonstrates a normal appearance  IMPRESSION: 1. 2.9 x 2.6 x 2.8 cm cystic metastasis within the central cerebellum with associated edema and mass effect on the adjacent fourth ventricle. 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 No frank transependymal flow of CSF at this time. 2. Multiple additional intracranial metastases involving the left cerebellar hemisphere, left frontal lobe, left parietal lobe, and left temporal lobe. 3. Remote basal ganglia and left subinsular infarcts. Critical Value/emergent results were called by telephone at the time of interpretation on 01/18/2014 at 10:18 pm to Dr. Rolland Porter , who verbally acknowledged these results.   Electronically Signed   By: Jeannine Boga M.D.   On: 01/18/2014 22:28     Ct Abdomen Pelvis W Contrast  01/18/2014   CLINICAL DATA:  Nausea, vomiting and generalized weakness with upper abdominal pain for 2 days. History of breast cancer.  EXAM: CT ABDOMEN AND PELVIS WITH CONTRAST  TECHNIQUE: Multidetector CT imaging of the abdomen and pelvis was performed using the standard protocol following bolus administration of intravenous contrast.  CONTRAST:  76m OMNIPAQUE IOHEXOL 300 MG/ML  SOLN  COMPARISON:  PET-CT 01/08/2014.  Abdominal pelvic CT 12/05/2012.  FINDINGS: Lung bases: Multiple metastases are present in both lung bases, similar to the recent PET-CT. A central right middle lobe component is incompletely visualized, but grossly stable. There is a stable peripherally enhancing mass within the right anterior chest wall, measuring 2.9 cm on image 8. There is no significant pleural effusion. A small pericardial effusion is present.  Liver/Biliary/Pancreas: There are numerous ill-defined low-density hepatic lesions which are worrisome for metastatic disease. The largest lesions are within the lateral segment of the left hepatic lobe (1.9 cm on image 16) and medially in the posterior segment of the right hepatic lobe (2.2 cm on image 16). There are multiple smaller lesions. No evidence of gallstones, gallbladder wall thickening or biliary dilatation. There is enlargement of the pancreatic tail with a 1.6 cm low-density lesion on image number 25.  Spleen/Adrenal glands: Unremarkable.  Kidneys/Ureters/Bladder: Both kidneys demonstrate cortical lobularity and small cysts. There is no hydronephrosis or evidence of urinary tract calculus. The bladder appears unremarkable.  Bowel/Peritoneum: The stomach, small bowel, appendix and colon demonstrate no significant findings. There is no bowel wall thickening or surrounding inflammatory change. Moderate stool is present throughout the colon. There is no ascites.  Retroperitoneum/Pelvis: There are no enlarged abdominal or pelvic lymph nodes.  There is diffuse atherosclerosis of the aorta and iliac arteries. The uterus and ovaries appear unchanged.  Abdominal wall: No abdominal wall masses or hernias.  Musculoskeletal: Osseous metastases are present, including a large lytic lesion within the left aspect of the L4 vertebral body. No significant pathologic fracture or epidural tumor identified.  IMPRESSION: 1. Widespread metastatic disease to the lung bases, liver and bones. There is a possible metastasis within the pancreatic tail. 2. No definite acute findings. 3. Grossly stable renal cortical lobularity and cysts. 4. Grossly stable atherosclerosis.   Electronically Signed   By: BCamie PatienceM.D.   On: 01/18/2014 21:26    Problem List  Principal Problem:   Breast cancer metastasized to brain Active Problems:  GERD (gastroesophageal reflux disease)   Schizophrenia   Breast cancer, right breast   Congestive dilated cardiomyopathy   Brain metastases   Assessment: This is an unfortunate, 63 year old, African American female, with metastatic breast cancer, who comes in with abdominal pain, and slurred speech. She has widely metastatic disease. She also has numerous intracranial metastases with a large lesion in the cerebellar area.  Plan: #1 slurred speech due to brain metastases from breast cancer with large cerebellar lesion/Widely Metastatic Breast cancer: ED physician discussed this briefly with neurosurgery, and they would defer to oncology to see if there is any role for surgical intervention. Patient's oncologist, is not in our hospital system. I have discussed her situation with the patient's sisters regarding transferring the patient to [place]. However, I also feel that the patient will benefit by seeing her own primary care physician here in the morning before further decisions are made. Patient's sister tells me that they do not want any aggressive intervention. They're mainly want her to be comfortable. I also reviewed Dr.  Corena Herter note from August and he also stated that the main goal is palliation. In view of all this I don't think that there is any urgent need to transfer this patient. I think there is more benefit in her seeing her own PCP in the morning and to decide further course of action at that point. In the, meantime, I will place her on Decadron. We will also consult our local Oncologist to see what they recommend. Prognosis appears to be quite poor.  #2 history of cardiomyopathy due to chemotherapy with the EF of about 20-30%: She appears to be well compensated. She'll be continued on her cardiac medications. Digoxin level will be checked.  #3 history of diabetes mellitus, type II, on insulin: Continue with her long-acting insulin at low dose. Sliding scale coverage will be initiated.  #4 history of schizophrenia: Continue with her home medications.  We will place her on a dysphagia diet for now. Speech therapist to assess her swallow function in the morning. Comfort appears to be the main goal at this time.   DVT Prophylaxis: SCDs Code Status: DO NOT RESUSCITATE Family Communication: Discussed with the sister is  Disposition Plan: Admit to [facility]. Dr. Luan Pulling to assume care in the morning.   Further management decisions will depend on results of further testing and patient's response to treatment.   [facility]  [facility] Pager [phone]  If 7PM-7AM, please contact night-coverage [email or web address] Password [email or web address]  01/18/2014, 11:36 PM

## 2014-01-18 NOTE — ED Provider Notes (Signed)
CSN: [id number]     Arrival date & time 01/18/14  1448 History   First MD Initiated Contact with Patient 01/18/14 1826     Chief Complaint  Patient presents with  . Abdominal Pain     (Consider location/radiation/quality/duration/timing/severity/associated sxs/prior Treatment) HPI Patient has metastatic breast cancer. She started getting abdominal pain over the last 3-4 days. She states her pain is periumbilical and seemed to get worse after she ate. She vomited twice yesterday and vomited once today. She describes she's mainly vomiting when she takes her medication. Denies diarrhea, fever, any urinary symptoms (she has chronic urgency and hesitancy). She reports having trouble swallowing her medication over the weekend. She reports she's had her esophagus stretched in the past however her sisters were unaware of it. She denies any difficulty swallowing food. Her family felt like she started having slurred speech over the weekend. She also is having difficulty walking and they feel like she is always falling to her left. She denies headache, chest pain, or shortness of breath.  PCP Dr Luan Pulling Oncology Dr Tressie Stalker in [place]  Past Medical History  Diagnosis Date  . Allergic rhinitis   . Bipolar disorder   . GERD (gastroesophageal reflux disease)   . Hypokalemia   . Hyperlipemia   . COPD (chronic obstructive pulmonary disease)   . Heart palpitations   . Anemia   . 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  . Diabetes mellitus without complication   . Coronary artery disease   . Cancer     breast  . Breast cancer     rt breast. diagnosed in 2013   Past Surgical History  Procedure Laterality Date  . Sterilization    . Colonoscopy    . Upper gastrointestinal endoscopy    . Dilation and curettage of uterus    . Colonoscopy with esophagogastroduodenoscopy (egd)  02/21/2012    Procedure: COLONOSCOPY WITH ESOPHAGOGASTRODUODENOSCOPY (EGD);  Surgeon:  Rogene Houston, MD;  Location: [facility];  Service: Endoscopy;  Laterality: N/A;  2:25  . Right breast lumpectomy      benign/Dr. Arnoldo Morale ?1990's  . Breast biopsy  2013    right breast  . Portacath placement  04/01/2012    Procedure: INSERTION PORT-A-CATH;  Surgeon: Scherry Ran, MD;  Location: [facility];  Service: General;  Laterality: Left;  Insertion Port-A-Cath Left Subclavian under Fluoroscopy   Family History  Problem Relation Age of Onset  . Seizures Mother   . COPD Father   . Hypertension Sister   . COPD Sister   . Arthritis Sister   . Diabetes Brother   . Hypertension Sister   . Breast cancer Sister     78 year survivor  . Arthritis Sister   . Thyroid disease Sister   . Arthritis Sister   . Diabetes Sister   . Arthritis Sister   . Heart disease Brother   . Healthy Brother   . Healthy Brother   . Healthy Son    History  Substance Use Topics  . Smoking status: Former Smoker -- 0.25 packs/day for 5 years    Quit date: 02/15/2012  . Smokeless tobacco: Never Used     Comment: 5 cigarettes a day   . Alcohol Use: No   Lives with sister  OB History   Grav Para Term Preterm Abortions TAB SAB Ect Mult Living  Review of Systems  All other systems reviewed and are negative.     Allergies  Ibuprofen and Other  Home Medications   Prior to Admission medications   Medication Sig Start Date End Date Taking? Authorizing Provider  aspirin 81 MG chewable tablet Chew 1 tablet (81 mg total) by mouth daily. 11/07/12  Yes Cherene Altes, MD  atorvastatin (LIPITOR) 40 MG tablet Take 40 mg by mouth at bedtime.   Yes Historical Provider, MD  digoxin (LANOXIN) 0.125 MG tablet Take 0.5 tablets (0.0625 mg total) by mouth daily. 10/08/13  Yes Rande Brunt, NP  docusate sodium (COLACE) 100 MG capsule Take 100 mg by mouth at bedtime. 02/05/12  Yes Rogene Houston, MD  fluPHENAZine decanoate (PROLIXIN) 25 MG/ML injection Inject 25 mg into the muscle every  14 (fourteen) days.   Yes Historical Provider, MD  furosemide (LASIX) 40 MG tablet Take 1 tablet (40 mg total) by mouth daily. 11/07/12  Yes Cherene Altes, MD  glipiZIDE (GLUCOTROL) 5 MG tablet Take 5 mg by mouth 2 (two) times daily before a meal. 12/11/12  Yes Amy D Clegg, NP  insulin detemir (LEVEMIR) 100 UNIT/ML injection Inject 40 Units into the skin daily.    Yes Historical Provider, MD  lisinopril (PRINIVIL,ZESTRIL) 2.5 MG tablet Take 1.25 mg by mouth 2 (two) times daily. Take 1/2 tab in AM and 1/2 tab in PM 12/24/12  Yes Jolaine Artist, MD  loratadine (CLARITIN) 10 MG tablet Take 10 mg by mouth daily.   Yes Historical Provider, MD  loxapine (LOXITANE) 10 MG capsule Take 10 mg by mouth at bedtime.   Yes Historical Provider, MD  magnesium oxide (MAG-OX) 400 MG tablet Take 400 mg by mouth daily.   Yes Historical Provider, MD  nortriptyline (PAMELOR) 50 MG capsule Take 50 mg by mouth at bedtime.    Yes Historical Provider, MD  omeprazole (PRILOSEC) 20 MG capsule Take 20 mg by mouth daily.   Yes Historical Provider, MD  polyvinyl alcohol (LIQUIFILM TEARS) 1.4 % ophthalmic solution Place 1 drop into both eyes 2 (two) times daily as needed. 11/07/12  Yes Cherene Altes, MD  potassium chloride SA (K-DUR,KLOR-CON) 20 MEQ tablet Take 1 tablet (20 mEq total) by mouth daily. 12/02/13  Yes Jolaine Artist, MD  Propylene Glycol (SYSTANE BALANCE) 0.6 % SOLN Apply 1 drop to eye 2 (two) times daily as needed (dry eyes).   Yes Historical Provider, MD  spironolactone (ALDACTONE) 25 MG tablet Take 1 tablet (25 mg total) by mouth daily. 12/02/13  Yes Shaune Pascal Bensimhon, MD  temazepam (RESTORIL) 15 MG capsule Take 15 mg by mouth at bedtime. 01/11/14  Yes Historical Provider, MD  traMADol (ULTRAM) 50 MG tablet Take 1 tablet (50 mg total) by mouth every 6 (six) hours as needed for moderate pain. 12/27/13  Yes Alonza Bogus, MD  traZODone (DESYREL) 150 MG tablet Take 150 mg by mouth at bedtime.   Yes  Historical Provider, MD  denosumab (PROLIA) 60 MG/ML SOLN injection Inject 60 mg into the skin every 6 (six) months. Administer in upper arm, thigh, or abdomen    Historical Provider, MD  heparin lock flush 100 UNIT/ML SOLN 5 mLs (500 Units total) by Intracatheter route every 30 (thirty) days. 11/07/12   Cherene Altes, MD  ramelteon (ROZEREM) 8 MG tablet Take 8 mg by mouth at bedtime as needed for sleep.     Historical Provider, MD   BP 116/76  Pulse 108  Temp(Src) 98.1 F (36.7 C) (Oral)  Resp 16  Ht 5\' 5"  (1.651 m)  Wt 125 lb (56.7 kg)  BMI 20.80 kg/m2  SpO2 96%  Vital signs normal except tachycardia Physical Exam  Nursing note and vitals reviewed. Constitutional: She is oriented to person, place, and time. She appears well-developed and well-nourished.  Non-toxic appearance. She does not appear ill. No distress.  HENT:  Head: Normocephalic and atraumatic.  Right Ear: External ear normal.  Left Ear: External ear normal.  Nose: Nose normal. No mucosal edema or rhinorrhea.  Mouth/Throat: Oropharynx is clear and moist and mucous membranes are normal. No dental abscesses or uvula swelling.  Eyes: Conjunctivae and EOM are normal. Pupils are equal, round, and reactive to light.  Neck: Normal range of motion and full passive range of motion without pain. Neck supple.  Cardiovascular: Normal rate, regular rhythm and normal heart sounds.  Exam reveals no gallop and no friction rub.   No murmur heard. Pulmonary/Chest: Effort normal and breath sounds normal. No respiratory distress. She has no wheezes. She has no rhonchi. She has no rales. She exhibits no tenderness and no crepitus.  Abdominal: Soft. Normal appearance and bowel sounds are normal. She exhibits no distension. There is tenderness in the left lower quadrant. There is no rebound and no guarding.    Musculoskeletal: Normal range of motion. She exhibits no edema and no tenderness.  Moves all extremities well.   Neurological:  She is alert and oriented to person, place, and time. She has normal strength. No cranial nerve deficit.  Equal grips, no facial asymmetry, no pronator drift, finger-to-nose intact bilaterally, heel-to-shin is done better with the right leg than the left leg but is intact. No motor weakness noted her large to the  Skin: Skin is warm, dry and intact. No rash noted. No erythema. No pallor.  Psychiatric: She has a normal mood and affect. Her speech is normal and behavior is normal. Her mood appears not anxious.    ED Course  Procedures (including critical care time)  Medications  0.9 %  sodium chloride infusion ( Intravenous New Bag/Given 01/18/14 2039)  0.9 %  sodium chloride infusion (not administered)  dexamethasone (DECADRON) injection 10 mg (not administered)  LORazepam (ATIVAN) injection 1 mg (1 mg Intravenous Given 01/18/14 2038)  ondansetron (ZOFRAN) injection 4 mg (4 mg Intravenous Given 01/18/14 2038)  fentaNYL (SUBLIMAZE) injection 50 mcg (50 mcg Intravenous Given 01/18/14 2038)  iohexol (OMNIPAQUE) 300 MG/ML solution 80 mL (80 mLs Intravenous Contrast Given 01/18/14 2049)  gadobenate dimeglumine (MULTIHANCE) injection 6 mL (6 mLs Intravenous Contrast Given 01/18/14 2140)   As I walked out of the room after our first interview patient smiles and asked  if she can eat.  I feel her left lower quadrant pain isn't actually in her abdomen that may be related more to her bony metastases.  22:18 Radiologist called her MRI results to me  Patient's hyperglycemia improved just with IV fluids.  22:30 I discussed her test results with patient, her sisters, and her brother who was on the phone. We are waiting for an opinion from neurosurgery and if she isn't a surgical candidate she may be a radiation oncology candidate  22:44 Dr Saintclair Halsted has reviewed her MR, start on steroids, have medicine admit and consult oncology and radiation oncology. He wants her to have staging and if oncology wants him to  remove the large cerebellar lesion they can discuss it with him. He is okay with her being admitted  at [facility].   22:54 Dr Vernell Barrier, will come see patient.   Labs Review Results for orders placed during the hospital encounter of 01/18/14  CBC WITH DIFFERENTIAL      Result Value Ref Range   WBC 8.8  4.0 - 10.5 K/uL   RBC 4.13  3.87 - 5.11 MIL/uL   Hemoglobin 13.2  12.0 - 15.0 g/dL   HCT 38.2  36.0 - 46.0 %   MCV 92.5  78.0 - 100.0 fL   MCH 32.0  26.0 - 34.0 pg   MCHC 34.6  30.0 - 36.0 g/dL   RDW 12.5  11.5 - 15.5 %   Platelets 310  150 - 400 K/uL   Neutrophils Relative % 80 (*) 43 - 77 %   Neutro Abs 7.0  1.7 - 7.7 K/uL   Lymphocytes Relative 15  12 - 46 %   Lymphs Abs 1.3  0.7 - 4.0 K/uL   Monocytes Relative 5  3 - 12 %   Monocytes Absolute 0.5  0.1 - 1.0 K/uL   Eosinophils Relative 0  0 - 5 %   Eosinophils Absolute 0.0  0.0 - 0.7 K/uL   Basophils Relative 0  0 - 1 %   Basophils Absolute 0.0  0.0 - 0.1 K/uL  COMPREHENSIVE METABOLIC PANEL      Result Value Ref Range   Sodium 134 (*) 137 - 147 mEq/L   Potassium 4.6  3.7 - 5.3 mEq/L   Chloride 90 (*) 96 - 112 mEq/L   CO2 29  19 - 32 mEq/L   Glucose, Bld 403 (*) 70 - 99 mg/dL   BUN 25 (*) 6 - 23 mg/dL   Creatinine, Ser 1.57 (*) 0.50 - 1.10 mg/dL   Calcium 10.2  8.4 - 10.5 mg/dL   Total Protein 8.4 (*) 6.0 - 8.3 g/dL   Albumin 4.0  3.5 - 5.2 g/dL   AST 34  0 - 37 U/L   ALT 35  0 - 35 U/L   Alkaline Phosphatase 199 (*) 39 - 117 U/L   Total Bilirubin 0.3  0.3 - 1.2 mg/dL   GFR calc non Af Amer 34 (*) >90 mL/min   GFR calc Af Amer 40 (*) >90 mL/min   Anion gap 15  5 - 15  URINALYSIS, ROUTINE W REFLEX MICROSCOPIC      Result Value Ref Range   Color, Urine YELLOW  YELLOW   APPearance CLEAR  CLEAR   Specific Gravity, Urine 1.010  1.005 - 1.030   pH 7.5  5.0 - 8.0   Glucose, UA NEGATIVE  NEGATIVE mg/dL   Hgb urine dipstick NEGATIVE  NEGATIVE   Bilirubin Urine NEGATIVE  NEGATIVE   Ketones, ur NEGATIVE  NEGATIVE mg/dL   Protein,  ur NEGATIVE  NEGATIVE mg/dL   Urobilinogen, UA 0.2  0.0 - 1.0 mg/dL   Nitrite NEGATIVE  NEGATIVE   Leukocytes, UA NEGATIVE  NEGATIVE  CBG MONITORING, ED      Result Value Ref Range   Glucose-Capillary 215 (*) 70 - 99 mg/dL   Laboratory interpretation all normal except hyperglycemia, renal insufficiency  Imaging Review Mr Jeri Cos Wo Contrast  01/18/2014   CLINICAL DATA:  Initial evaluation for altered mental status today, weakness, inability to walk. History of breast cancer.  EXAM: MRI HEAD WITHOUT AND WITH CONTRAST  TECHNIQUE: Multiplanar, multiecho pulse sequences of the brain and surrounding structures were obtained without and with intravenous contrast.  CONTRAST:  61mL MULTIHANCE GADOBENATE DIMEGLUMINE 529  MG/ML IV SOLN  COMPARISON:  Most recent MRI from 08/10/2009  FINDINGS: Mild diffuse prominence of the CSF containing spaces is compatible with generalized cerebral atrophy. Mild patchy and confluent T2/FLAIR hyperintensity within the periventricular and deep white matter of the bilateral cerebral hemispheres, likely related to remote ischemia. Small remote lacunar infarcts seen within the bilateral basal ganglia, left greater than right, as well as the left subinsular white matter.  No abnormal foci of restricted diffusion are seen to suggest acute intracranial infarct. Gray-white matter differentiation is maintained. Normal flow voids seen within the intracranial vasculature.  An irregular multi cystic T2 hyperintense cystic mass is seen within the central cerebellum, primarily involving the median left cerebellar hemisphere, but crossing midline into the medial right cerebellar hemisphere as well. This lesion measured 2.9 (AP) x 2.6 (transverse) x 2.8 (craniocaudad) cm and demonstrates peripheral rim enhancement (series 11, image 33). Minimal blood breakdown products seen within this lesion. Smaller satellite lesion measuring 1.0 x 0.8 x 9.0 cm present within the left cerebellar hemisphere  (series 10, image 37). There is a third lesion within the left cerebellar hemisphere measuring 1.2 x 0.9 x 1.0 cm. 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 No definite transependymal flow of CSF at this time. The cerebellar tonsils remain normally located above the foramen magnum without inferior herniation.  There are are 2 additional lesions within the high left frontal and parietal lobes measuring 0.8 x 0.6 x 0.9 cm and 0.6 x 0.8 x 0.7 cm respectively (series 11, image 75). Mild localized vasogenic edema. Additional small metastasis seen within the peripheral left temporal lobe measures 5 mm (series 11, image 30).  No midline shift. No extra-axial fluid collection. No intracranial hemorrhage.  Pituitary gland within normal limits. No acute abnormality seen about either orbit.  Bone marrow signal intensity demonstrates a normal appearance  IMPRESSION: 1. 2.9 x 2.6 x 2.8 cm cystic metastasis within the central cerebellum with associated edema and mass effect on the adjacent fourth ventricle. 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 No frank transependymal flow of CSF at this time. 2. Multiple additional intracranial metastases involving the left cerebellar hemisphere, left frontal lobe, left parietal lobe, and left temporal lobe. 3. Remote basal ganglia and left subinsular infarcts. Critical Value/emergent results were called by telephone at the time of interpretation on 01/18/2014 at 10:18 pm to Dr. Rolland Porter , who verbally acknowledged these results.   Electronically Signed   By: Jeannine Boga M.D.   On: 01/18/2014 22:28   Ct Abdomen Pelvis W Contrast  01/18/2014   CLINICAL DATA:  Nausea,  vomiting and generalized weakness with upper abdominal pain for 2 days. History of breast cancer.  EXAM: CT ABDOMEN AND PELVIS WITH CONTRAST  TECHNIQUE: Multidetector CT imaging of the abdomen and pelvis was performed using the standard protocol following bolus administration of intravenous contrast.  CONTRAST:  40mL OMNIPAQUE IOHEXOL 300 MG/ML  SOLN  COMPARISON:  PET-CT 01/08/2014.  Abdominal pelvic CT 12/05/2012.  FINDINGS: Lung bases: Multiple metastases are present in both lung bases, similar to the recent PET-CT. A central right middle lobe component is incompletely visualized, but grossly stable. There is a stable peripherally enhancing mass within the right anterior chest wall, measuring 2.9 cm on image 8. There is no  significant pleural effusion. A small pericardial effusion is present.  Liver/Biliary/Pancreas: There are numerous ill-defined low-density hepatic lesions which are worrisome for metastatic disease. The largest lesions are within the lateral segment of the left hepatic lobe (1.9 cm on image 16) and medially in the posterior segment of the right hepatic lobe (2.2 cm on image 16). There are multiple smaller lesions. No evidence of gallstones, gallbladder wall thickening or biliary dilatation. There is enlargement of the pancreatic tail with a 1.6 cm low-density lesion on image number 25.  Spleen/Adrenal glands: Unremarkable.  Kidneys/Ureters/Bladder: Both kidneys demonstrate cortical lobularity and small cysts. There is no hydronephrosis or evidence of urinary tract calculus. The bladder appears unremarkable.  Bowel/Peritoneum: The stomach, small bowel, appendix and colon demonstrate no significant findings. There is no bowel wall thickening or surrounding inflammatory change. Moderate stool is present throughout the colon. There is no ascites.  Retroperitoneum/Pelvis: There are no enlarged abdominal or pelvic lymph nodes. There is diffuse atherosclerosis of the aorta and iliac arteries. The  uterus and ovaries appear unchanged.  Abdominal wall: No abdominal wall masses or hernias.  Musculoskeletal: Osseous metastases are present, including a large lytic lesion within the left aspect of the L4 vertebral body. No significant pathologic fracture or epidural tumor identified.  IMPRESSION: 1. Widespread metastatic disease to the lung bases, liver and bones. There is a possible metastasis within the pancreatic tail. 2. No definite acute findings. 3. Grossly stable renal cortical lobularity and cysts. 4. Grossly stable atherosclerosis.   Electronically Signed   By: Camie Patience M.D.   On: 01/18/2014 21:26    Dg Chest 2 View  12/26/2013   CLINICAL DATA:  Chest and flank pain.   IMPRESSION: Small bilateral pulmonary nodules consistent with metastatic disease.  Chronic lung changes but no definite acute pulmonary infiltrates.   Electronically Signed   By: Kalman Jewels M.D.   On: 12/26/2013 01:11   Nm Pulmonary Perf And Vent  12/26/2013   CLINICAL DATA:  Left-sided chest pain  .  IMPRESSION: No evidence of pulmonary embolism.   Electronically Signed   By: Skipper Cliche M.D.   On: 12/26/2013 12:54   Nm Pet Image Restag (ps) Skull Base To Thigh  01/08/2014   CLINICAL DATA:  Subsequent treatment strategy for breast carcinoma.   IMPRESSION: 1. Progression of breast cancer metastasis with new pulmonary metastasis and skeletal metastasis. 2. Hypermetabolic mass in the medial right breast at site of prior malignancy. 3. Multiple bilateral small pulmonary nodules are new from prior. Focus of hypermetabolic consolidation in the right middle lobe. 4. Widespread skeletal metastasis with hypermetabolic lytic and sclerotic lesions in the pelvis, spine, ribs, and left scapula.   Electronically Signed   By: Suzy Bouchard M.D.   On: 01/08/2014 11:28   US Venous Img Lower Bilateral  12/26/2013   CLINICAL DATA:  Chest pain    LEFT LOWER EXTREMITY  Common Femoral Vein: No evidence of thrombus. Normal  compressibility, respiratory phasicity and response to augmentation.  Saphenofemoral Junction: No evidence of thrombus. Normal compressibility and flow on color Doppler imaging.  Profunda Femoral Vein: No evidence of thrombus. Normal compressibility and flow on color Doppler imaging.  Femoral Vein: No evidence of thrombus. Normal compressibility, respiratory phasicity and response to augmentation.  Popliteal Vein: No evidence of thrombus. Normal compressibility, respiratory phasicity and response to augmentation.  Calf Veins: No evidence of thrombus.  IMPRESSION: No evidence of deep venous thrombosis.   Electronically Signed   By: Skipper Cliche  M.D.   On: 12/26/2013 10:58    EKG Interpretation None      MDM   Final diagnoses:  Breast cancer, unspecified laterality  Liver metastases  Malignant neoplasm metastatic to lung, unspecified laterality  Bone metastases  Brain metastases  Slurred speech   Plan transfer to [facility] for admission   Rolland Porter, MD, Alanson Aly, MD 01/18/14 2255

## 2014-01-18 NOTE — ED Notes (Signed)
Family says pt has had slurred speech, difficulty swallowing  And abd  Pain since Saturday.  N/v  Generalized weakness

## 2014-01-19 ENCOUNTER — Encounter (HOSPITAL_COMMUNITY): Payer: Self-pay | Admitting: *Deleted

## 2014-01-19 ENCOUNTER — Other Ambulatory Visit (HOSPITAL_COMMUNITY): Payer: Self-pay | Admitting: Oncology

## 2014-01-19 DIAGNOSIS — E43 Unspecified severe protein-calorie malnutrition: Secondary | ICD-10-CM | POA: Diagnosis present

## 2014-01-19 DIAGNOSIS — R109 Unspecified abdominal pain: Secondary | ICD-10-CM

## 2014-01-19 DIAGNOSIS — R4182 Altered mental status, unspecified: Secondary | ICD-10-CM

## 2014-01-19 DIAGNOSIS — C7800 Secondary malignant neoplasm of unspecified lung: Secondary | ICD-10-CM

## 2014-01-19 DIAGNOSIS — C7951 Secondary malignant neoplasm of bone: Secondary | ICD-10-CM

## 2014-01-19 LAB — COMPREHENSIVE METABOLIC PANEL
ALBUMIN: 3.8 g/dL (ref 3.5–5.2)
ALT: 32 U/L (ref 0–35)
AST: 28 U/L (ref 0–37)
Alkaline Phosphatase: 191 U/L — ABNORMAL HIGH (ref 39–117)
Anion gap: 14 (ref 5–15)
BILIRUBIN TOTAL: 0.3 mg/dL (ref 0.3–1.2)
BUN: 24 mg/dL — ABNORMAL HIGH (ref 6–23)
CHLORIDE: 91 meq/L — AB (ref 96–112)
CO2: 26 meq/L (ref 19–32)
Calcium: 9.7 mg/dL (ref 8.4–10.5)
Creatinine, Ser: 1.38 mg/dL — ABNORMAL HIGH (ref 0.50–1.10)
GFR calc Af Amer: 46 mL/min — ABNORMAL LOW (ref >90)
GFR calc non Af Amer: 40 mL/min — ABNORMAL LOW (ref >90)
Glucose, Bld: 387 mg/dL — ABNORMAL HIGH (ref 70–99)
Potassium: 5 mEq/L (ref 3.7–5.3)
SODIUM: 131 meq/L — AB (ref 137–147)
Total Protein: 8.1 g/dL (ref 6.0–8.3)

## 2014-01-19 LAB — GLUCOSE, CAPILLARY
GLUCOSE-CAPILLARY: 256 mg/dL — AB (ref 70–99)
GLUCOSE-CAPILLARY: 443 mg/dL — AB (ref 70–99)
GLUCOSE-CAPILLARY: 584 mg/dL — AB (ref 70–99)
GLUCOSE-CAPILLARY: 508 mg/dL — AB (ref 70–99)
Glucose-Capillary: 341 mg/dL — ABNORMAL HIGH (ref 70–99)
Glucose-Capillary: 201 mg/dL — ABNORMAL HIGH (ref 70–99)

## 2014-01-19 LAB — CBC
HEMATOCRIT: 36.4 % (ref 36.0–46.0)
Hemoglobin: 12.6 g/dL (ref 12.0–15.0)
MCH: 31.7 pg (ref 26.0–34.0)
MCHC: 34.6 g/dL (ref 30.0–36.0)
MCV: 91.5 fL (ref 78.0–100.0)
PLATELETS: 309 10*3/uL (ref 150–400)
RBC: 3.98 MIL/uL (ref 3.87–5.11)
RDW: 12.5 % (ref 11.5–15.5)
WBC: 9 10*3/uL (ref 4.0–10.5)

## 2014-01-19 LAB — DIGOXIN LEVEL: DIGOXIN LVL: 0.9 ng/mL (ref 0.8–2.0)

## 2014-01-19 LAB — GLUCOSE, RANDOM: Glucose, Bld: 432 mg/dL — ABNORMAL HIGH (ref 70–99)

## 2014-01-19 MED ORDER — DEXAMETHASONE SODIUM PHOSPHATE 10 MG/ML IJ SOLN
10.0000 mg | Freq: Four times a day (QID) | INTRAMUSCULAR | Status: DC
  Administered 2014-01-19: 10 mg via INTRAVENOUS
  Filled 2014-01-19 (×6): qty 1

## 2014-01-19 MED ORDER — MORPHINE SULFATE 2 MG/ML IJ SOLN
2.0000 mg | INTRAMUSCULAR | Status: DC | PRN
  Administered 2014-01-20 – 2014-01-21 (×4): 2 mg via INTRAVENOUS
  Filled 2014-01-19 (×4): qty 1

## 2014-01-19 MED ORDER — ENSURE COMPLETE PO LIQD
237.0000 mL | Freq: Two times a day (BID) | ORAL | Status: DC
  Administered 2014-01-19 – 2014-01-21 (×3): 237 mL via ORAL

## 2014-01-19 MED ORDER — INSULIN ASPART 100 UNIT/ML ~~LOC~~ SOLN
0.0000 [IU] | Freq: Three times a day (TID) | SUBCUTANEOUS | Status: DC
  Administered 2014-01-19: 15 [IU] via SUBCUTANEOUS
  Administered 2014-01-20: 11 [IU] via SUBCUTANEOUS
  Administered 2014-01-20: 20 [IU] via SUBCUTANEOUS
  Administered 2014-01-21: 7 [IU] via SUBCUTANEOUS

## 2014-01-19 MED ORDER — DEXAMETHASONE SODIUM PHOSPHATE 4 MG/ML IJ SOLN: 4.0000 mg | Freq: Four times a day (QID) | INTRAMUSCULAR | Status: DC

## 2014-01-19 MED ORDER — INSULIN DETEMIR 100 UNIT/ML ~~LOC~~ SOLN
20.0000 [IU] | Freq: Every day | SUBCUTANEOUS | Status: DC
  Filled 2014-01-19: qty 0.2

## 2014-01-19 MED ORDER — DEXAMETHASONE SODIUM PHOSPHATE 10 MG/ML IJ SOLN
6.0000 mg | Freq: Four times a day (QID) | INTRAMUSCULAR | Status: AC
  Administered 2014-01-19 – 2014-01-20 (×4): 6 mg via INTRAVENOUS
  Filled 2014-01-19 (×5): qty 0.6

## 2014-01-19 MED ORDER — ONDANSETRON HCL 4 MG PO TABS: 4.0000 mg | ORAL_TABLET | Freq: Four times a day (QID) | ORAL | Status: DC | PRN

## 2014-01-19 MED ORDER — INSULIN ASPART 100 UNIT/ML ~~LOC~~ SOLN
30.0000 [IU] | Freq: Once | SUBCUTANEOUS | Status: AC
  Administered 2014-01-19: 30 [IU] via SUBCUTANEOUS

## 2014-01-19 MED ORDER — INFLUENZA VAC SPLIT QUAD 0.5 ML IM SUSY
0.5000 mL | PREFILLED_SYRINGE | INTRAMUSCULAR | Status: AC
  Administered 2014-01-20: 0.5 mL via INTRAMUSCULAR
  Filled 2014-01-19: qty 0.5

## 2014-01-19 MED ORDER — OXYCODONE HCL 5 MG PO TABS: 5.0000 mg | ORAL_TABLET | ORAL | Status: DC | PRN

## 2014-01-19 MED ORDER — INSULIN ASPART 100 UNIT/ML ~~LOC~~ SOLN
0.0000 [IU] | Freq: Every day | SUBCUTANEOUS | Status: DC
  Administered 2014-01-19: 2 [IU] via SUBCUTANEOUS
  Administered 2014-01-20: 3 [IU] via SUBCUTANEOUS

## 2014-01-19 MED ORDER — INSULIN ASPART 100 UNIT/ML ~~LOC~~ SOLN: 0.0000 [IU] | Freq: Three times a day (TID) | SUBCUTANEOUS | Status: DC

## 2014-01-19 MED ORDER — ASPIRIN 81 MG PO CHEW
81.0000 mg | CHEWABLE_TABLET | Freq: Every day | ORAL | Status: DC
  Administered 2014-01-19 – 2014-01-21 (×3): 81 mg via ORAL
  Filled 2014-01-19 (×3): qty 1

## 2014-01-19 MED ORDER — ACETAMINOPHEN 650 MG RE SUPP: 650.0000 mg | Freq: Four times a day (QID) | RECTAL | Status: DC | PRN

## 2014-01-19 MED ORDER — SODIUM CHLORIDE 0.9 % IV SOLN
INTRAVENOUS | Status: AC
  Administered 2014-01-19: 10:00:00 via INTRAVENOUS

## 2014-01-19 MED ORDER — FUROSEMIDE 40 MG PO TABS
40.0000 mg | ORAL_TABLET | Freq: Every day | ORAL | Status: DC
  Administered 2014-01-19 – 2014-01-21 (×3): 40 mg via ORAL
  Filled 2014-01-19 (×3): qty 1

## 2014-01-19 MED ORDER — ATORVASTATIN CALCIUM 40 MG PO TABS
40.0000 mg | ORAL_TABLET | Freq: Every day | ORAL | Status: DC
  Administered 2014-01-19 – 2014-01-20 (×3): 40 mg via ORAL
  Filled 2014-01-19 (×3): qty 1

## 2014-01-19 MED ORDER — INSULIN ASPART 100 UNIT/ML ~~LOC~~ SOLN
20.0000 [IU] | Freq: Once | SUBCUTANEOUS | Status: AC
Start: 2014-01-19 — End: 2014-01-19
  Administered 2014-01-19: 20 [IU] via SUBCUTANEOUS

## 2014-01-19 MED ORDER — NORTRIPTYLINE HCL 25 MG PO CAPS
50.0000 mg | ORAL_CAPSULE | Freq: Every day | ORAL | Status: DC
  Administered 2014-01-19 – 2014-01-20 (×3): 50 mg via ORAL
  Filled 2014-01-19 (×3): qty 2

## 2014-01-19 MED ORDER — ONDANSETRON HCL 4 MG/2ML IJ SOLN
4.0000 mg | Freq: Four times a day (QID) | INTRAMUSCULAR | Status: DC | PRN
  Administered 2014-01-20: 4 mg via INTRAVENOUS
  Filled 2014-01-19: qty 2

## 2014-01-19 MED ORDER — DEXAMETHASONE SODIUM PHOSPHATE 10 MG/ML IJ SOLN
INTRAMUSCULAR | Status: AC
  Filled 2014-01-19: qty 1

## 2014-01-19 MED ORDER — INSULIN DETEMIR 100 UNIT/ML ~~LOC~~ SOLN
20.0000 [IU] | Freq: Two times a day (BID) | SUBCUTANEOUS | Status: DC
  Administered 2014-01-19 – 2014-01-21 (×5): 20 [IU] via SUBCUTANEOUS
  Filled 2014-01-19 (×7): qty 0.2

## 2014-01-19 MED ORDER — INSULIN ASPART 100 UNIT/ML ~~LOC~~ SOLN: 0.0000 [IU] | Freq: Every day | SUBCUTANEOUS | Status: DC

## 2014-01-19 MED ORDER — DOCUSATE SODIUM 100 MG PO CAPS
100.0000 mg | ORAL_CAPSULE | Freq: Two times a day (BID) | ORAL | Status: DC
  Administered 2014-01-19 – 2014-01-21 (×5): 100 mg via ORAL
  Filled 2014-01-19 (×6): qty 1

## 2014-01-19 MED ORDER — LOXAPINE SUCCINATE 10 MG PO CAPS
10.0000 mg | ORAL_CAPSULE | Freq: Every day | ORAL | Status: DC
  Administered 2014-01-19: 10 mg via ORAL
  Filled 2014-01-19 (×4): qty 1

## 2014-01-19 MED ORDER — INSULIN ASPART 100 UNIT/ML ~~LOC~~ SOLN
20.0000 [IU] | Freq: Once | SUBCUTANEOUS | Status: AC
  Administered 2014-01-19: 20 [IU] via SUBCUTANEOUS

## 2014-01-19 MED ORDER — ACETAMINOPHEN 325 MG PO TABS: 650.0000 mg | ORAL_TABLET | Freq: Four times a day (QID) | ORAL | Status: DC | PRN

## 2014-01-19 MED ORDER — TEMAZEPAM 15 MG PO CAPS
15.0000 mg | ORAL_CAPSULE | Freq: Every day | ORAL | Status: DC
  Administered 2014-01-19 – 2014-01-20 (×3): 15 mg via ORAL
  Filled 2014-01-19 (×3): qty 1

## 2014-01-19 MED ORDER — SPIRONOLACTONE 25 MG PO TABS
25.0000 mg | ORAL_TABLET | Freq: Every day | ORAL | Status: DC
  Administered 2014-01-19 – 2014-01-21 (×3): 25 mg via ORAL
  Filled 2014-01-19 (×3): qty 1

## 2014-01-19 MED ORDER — FLUPHENAZINE DECANOATE 25 MG/ML IJ SOLN
25.0000 mg | INTRAMUSCULAR | Status: DC
  Filled 2014-01-19: qty 1

## 2014-01-19 MED ORDER — FLUPHENAZINE DECANOATE 25 MG/ML IJ SOLN
25.0000 mg | INTRAMUSCULAR | Status: DC
  Administered 2014-01-19: 25 mg via INTRAMUSCULAR
  Filled 2014-01-19: qty 1

## 2014-01-19 MED ORDER — DIGOXIN 125 MCG PO TABS
0.0625 mg | ORAL_TABLET | Freq: Every day | ORAL | Status: DC
  Administered 2014-01-19 – 2014-01-21 (×3): 0.0625 mg via ORAL
  Filled 2014-01-19 (×3): qty 1

## 2014-01-19 NOTE — Consult Note (Signed)
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  She was also started on Arimidex for a PR 1% tumor.  She reported to the ED on 10/29/2012 with SOB and she was noted to have acute systolic heart failure with an EF of 20%, believed to be delayed cardiotoxicity from Adriamycin, despite having an EF of 55% at the completion of systemic chemotherapy.  As a result of this acute event, radiation and Arimidex were discontinued (Arimidex has a 4% risk of ischemic heart disease, 17% risk of ischemic heart disease in those with pre-existing disease, and 1% risk of MI).  She was last seen at [facility] on 11/24/2012 at which time she transferred her care to [facility] to remain a patient of her original treating oncologist, Dr. Tressie Stalker.  She was seen by Dr. Tressie Stalker on 12/15/2013 and he was concerned about progressive metastatic disease.  He ordered a PET scan and began a few palliative interventions including re-vamping pain control regimen and addressing appetite stimulation.  On 01/08/2014, the patient underwent a PET scan that clearly identified metastatic disease: 1. Progression of breast cancer metastasis with new pulmonary  metastasis and skeletal metastasis.  2. Hypermetabolic mass in the medial right breast at site of prior  malignancy.  3. Multiple  bilateral small pulmonary nodules are new from prior.  Focus of hypermetabolic consolidation in the right middle lobe.  4. Widespread skeletal metastasis with hypermetabolic lytic and  sclerotic lesions in the pelvis, spine, ribs, and left scapula.  She had an appointment with Dr. Tressie Stalker scheduled for today, but got admitted to the [facility] yesterday (10/5) with abdominal pain and mental status changes.  In the ED, [name] complained of abdominal pain and mental status changes.  As a result, imaging studies were completed.  CT abd/pelvis demonstrated widespread metastatic disease to the lung bases, liver and bones. There is a possible metastasis within the pancreatic tail.  Due to her mental status changes, an MRI of brain was performed showing  2.9 x 2.6 x .8 cm cystic metastasis within the central cerebellum with associated edema and mass effect on the adjacent fourth ventricle. 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 No frank transependymal flow of CSF at this time.  Multiple additional intracranial metastases involving the left cerebellar hemisphere, left frontal lobe, left parietal lobe, and left temporal lobe. Remote basal ganglia and left subinsular infarcts.  With these findings she was admitted to the hospital after patient's sister refused transfer to [facility] because the patient and family are not interested in "aggressive" therapy.  As a result, she was admitted to [facility] and oncology was consulted.  Today I met with the patient and family.  I personally reviewed and went over radiographic studies with the patient.  The  results are noted within this dictation.  With the aforementioned information, the patient and family was informed that she now has Stage IV recurrent breast cancer.  This disease is not curable, yet we have options: 1. Treatment with radiation and  systemic chemotherapy 2. Hospice.  I believe with the extent of disease, palliative radiation help neurologic symptoms, but also prolong life increasing the risk of significant bony pain and/or fracture leading to an undignified and painful death.  As a result, I advised against palliative radiation.  Its either radiation treatment plus systemic therapy, or transition to comfort care in my opinion.  We had a long discussion regarding the pros and cons of treatment and the pros and cons of comfort care with Hospice.  The patient and family are well aware of the services Hospice can offer as she was a previous patient of Hospice.  She denies any pain.  She does not some neurological issues including vision changes, ambulation problems, etc.  She denies any headaches presently.  I provided the patient and family education regarding the role of high dose steroids and of course the complications of steroids.  Oncologically, she otherwise denies any complaints and ROS questioning is negative.   PAST MEDICAL HISTORY:   Past Medical History  Diagnosis Date  . Allergic rhinitis   . Bipolar disorder   . GERD (gastroesophageal reflux disease)   . Hypokalemia   . Hyperlipemia   . COPD (chronic obstructive pulmonary disease)   . Heart palpitations   . Anemia   . 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  . Diabetes mellitus without complication   . Coronary artery disease   . Cancer     breast  . Breast cancer     rt breast. diagnosed in 2013    ALLERGIES: Allergies  Allergen Reactions  . Ibuprofen Nausea Only  . Other Other (See Comments)    Ragweed Pepper - Sneezing      MEDICATIONS: I have reviewed the patient's current medications.     PAST SURGICAL HISTORY Past Surgical History  Procedure Laterality Date  . Sterilization    . Colonoscopy    . Upper gastrointestinal endoscopy    . Dilation and curettage of uterus    . Colonoscopy with  esophagogastroduodenoscopy (egd)  02/21/2012    Procedure: COLONOSCOPY WITH ESOPHAGOGASTRODUODENOSCOPY (EGD);  Surgeon: Rogene Houston, MD;  Location: [facility] ENDO SUITE;  Service: Endoscopy;  Laterality: N/A;  2:25  . Right breast lumpectomy      benign/Dr. Arnoldo Morale ?1990's  . Breast biopsy  2013    right breast  . Portacath placement  04/01/2012    Procedure: INSERTION PORT-A-CATH;  Surgeon: Scherry Ran, MD;  Location: [facility] ORS;  Service: General;  Laterality: Left;  Insertion Port-A-Cath Left Subclavian under Fluoroscopy    FAMILY HISTORY: Family History  Problem Relation Age of Onset  . Seizures Mother   . COPD Father   . Hypertension Sister   . COPD Sister   . Arthritis Sister   . Diabetes Brother   . Hypertension Sister   . Breast cancer Sister     14 year survivor  . Arthritis Sister   . Thyroid disease Sister   . Arthritis Sister   . Diabetes Sister   . Arthritis Sister   . Heart disease Brother   . Healthy Brother   . Healthy Brother   . Healthy Son     SOCIAL HISTORY:  reports  that she quit smoking about 23 months ago. She has never used smokeless tobacco. She reports that she does not drink alcohol or use illicit drugs.  PERFORMANCE STATUS: The patient's performance status is 3 - Symptomatic, >50% confined to bed  PHYSICAL EXAM: Most Recent Vital Signs: Blood pressure 118/65, pulse 108, temperature 98.1 F (36.7 C), temperature source Oral, resp. rate 18, height _0  (1.651 m), weight 121 lb 11.2 oz (55.203 kg), SpO2 97.00%. General appearance: alert, cooperative, appears older than stated age, cachectic, no distress and slowed mentation Head: Normocephalic, without obvious abnormality, atraumatic Eyes: positive findings: conjunctiva: clear Neck: supple, symmetrical, trachea midline Skin: Skin color, texture, turgor normal. No rashes or lesions Neurologic: Mental status: Alert, oriented, thought content appropriate  LABORATORY DATA:  Results for orders  placed during the hospital encounter of 01/18/14 (from the past 48 hour(s))  CBC WITH DIFFERENTIAL     Status: Abnormal   Collection Time    01/18/14  3:13 PM      Result Value Ref Range   WBC 8.8  4.0 - 10.5 K/uL   RBC 4.13  3.87 - 5.11 MIL/uL   Hemoglobin 13.2  12.0 - 15.0 g/dL   HCT 38.2  36.0 - 46.0 %   MCV 92.5  78.0 - 100.0 fL   MCH 32.0  26.0 - 34.0 pg   MCHC 34.6  30.0 - 36.0 g/dL   RDW 12.5  11.5 - 15.5 %   Platelets 310  150 - 400 K/uL   Neutrophils Relative % 80 (*) 43 - 77 %   Neutro Abs 7.0  1.7 - 7.7 K/uL   Lymphocytes Relative 15  12 - 46 %   Lymphs Abs 1.3  0.7 - 4.0 K/uL   Monocytes Relative 5  3 - 12 %   Monocytes Absolute 0.5  0.1 - 1.0 K/uL   Eosinophils Relative 0  0 - 5 %   Eosinophils Absolute 0.0  0.0 - 0.7 K/uL   Basophils Relative 0  0 - 1 %   Basophils Absolute 0.0  0.0 - 0.1 K/uL  COMPREHENSIVE METABOLIC PANEL     Status: Abnormal   Collection Time    01/18/14  3:13 PM      Result Value Ref Range   Sodium 134 (*) 137 - 147 mEq/L   Potassium 4.6  3.7 - 5.3 mEq/L   Chloride 90 (*) 96 - 112 mEq/L   CO2 29  19 - 32 mEq/L   Glucose, Bld 403 (*) 70 - 99 mg/dL   BUN 25 (*) 6 - 23 mg/dL   Creatinine, Ser 1.57 (*) 0.50 - 1.10 mg/dL   Calcium 10.2  8.4 - 10.5 mg/dL   Total Protein 8.4 (*) 6.0 - 8.3 g/dL   Albumin 4.0  3.5 - 5.2 g/dL   AST 34  0 - 37 U/L   ALT 35  0 - 35 U/L   Alkaline Phosphatase 199 (*) 39 - 117 U/L   Total Bilirubin 0.3  0.3 - 1.2 mg/dL   GFR calc non Af Amer 34 (*) >90 mL/min   GFR calc Af Amer 40 (*) >90 mL/min   Comment: (NOTE)     The eGFR has been calculated using the CKD EPI equation.     This calculation has not been validated in all clinical situations.     eGFR's persistently <90 mL/min signify possible Chronic Kidney     Disease.   Anion gap 15  5 - 15  URINALYSIS, ROUTINE W REFLEX MICROSCOPIC     Status: None   Collection Time    01/18/14 10:03 PM      Result Value Ref Range   Color, Urine YELLOW  YELLOW    APPearance CLEAR  CLEAR   Specific Gravity, Urine 1.010  1.005 - 1.030   pH 7.5  5.0 - 8.0   Glucose, UA NEGATIVE  NEGATIVE mg/dL   Hgb urine dipstick NEGATIVE  NEGATIVE   Bilirubin Urine NEGATIVE  NEGATIVE   Ketones, ur NEGATIVE  NEGATIVE mg/dL   Protein, ur NEGATIVE  NEGATIVE mg/dL   Urobilinogen, UA 0.2  0.0 - 1.0 mg/dL   Nitrite NEGATIVE  NEGATIVE   Leukocytes, UA NEGATIVE  NEGATIVE   Comment: MICROSCOPIC NOT DONE ON URINES WITH NEGATIVE PROTEIN, BLOOD, LEUKOCYTES, NITRITE, OR GLUCOSE <1000 mg/dL.  CBG MONITORING, ED     Status: Abnormal   Collection Time    01/18/14 10:12 PM      Result Value Ref Range   Glucose-Capillary 215 (*) 70 - 99 mg/dL  GLUCOSE, CAPILLARY     Status: Abnormal   Collection Time    01/19/14  2:11 AM      Result Value Ref Range   Glucose-Capillary 256 (*) 70 - 99 mg/dL  DIGOXIN LEVEL     Status: None   Collection Time    01/19/14  5:05 AM      Result Value Ref Range   Digoxin Level 0.9  0.8 - 2.0 ng/mL  COMPREHENSIVE METABOLIC PANEL     Status: Abnormal   Collection Time    01/19/14  5:05 AM      Result Value Ref Range   Sodium 131 (*) 137 - 147 mEq/L   Potassium 5.0  3.7 - 5.3 mEq/L   Chloride 91 (*) 96 - 112 mEq/L   CO2 26  19 - 32 mEq/L   Glucose, Bld 387 (*) 70 - 99 mg/dL   BUN 24 (*) 6 - 23 mg/dL   Creatinine, Ser 1.38 (*) 0.50 - 1.10 mg/dL   Calcium 9.7  8.4 - 10.5 mg/dL   Total Protein 8.1  6.0 - 8.3 g/dL   Albumin 3.8  3.5 - 5.2 g/dL   AST 28  0 - 37 U/L   ALT 32  0 - 35 U/L   Alkaline Phosphatase 191 (*) 39 - 117 U/L   Total Bilirubin 0.3  0.3 - 1.2 mg/dL   GFR calc non Af Amer 40 (*) >90 mL/min   GFR calc Af Amer 46 (*) >90 mL/min   Comment: (NOTE)     The eGFR has been calculated using the CKD EPI equation.     This calculation has not been validated in all clinical situations.     eGFR's persistently <90 mL/min signify possible Chronic Kidney     Disease.   Anion gap 14  5 - 15  CBC     Status: None   Collection Time     01/19/14  5:05 AM      Result Value Ref Range   WBC 9.0  4.0 - 10.5 K/uL   RBC 3.98  3.87 - 5.11 MIL/uL   Hemoglobin 12.6  12.0 - 15.0 g/dL   HCT 36.4  36.0 - 46.0 %   MCV 91.5  78.0 - 100.0 fL   MCH 31.7  26.0 - 34.0 pg   MCHC 34.6  30.0 - 36.0 g/dL   RDW 12.5  11.5 - 15.5 %  Platelets 309  150 - 400 K/uL  GLUCOSE, CAPILLARY     Status: Abnormal   Collection Time    01/19/14  8:20 AM      Result Value Ref Range   Glucose-Capillary 443 (*) 70 - 99 mg/dL   Comment 1 Documented in Chart     Comment 2 Notify RN    GLUCOSE, RANDOM     Status: Abnormal   Collection Time    01/19/14  8:39 AM      Result Value Ref Range   Glucose, Bld 432 (*) 70 - 99 mg/dL  GLUCOSE, CAPILLARY     Status: Abnormal   Collection Time    01/19/14 11:25 AM      Result Value Ref Range   Glucose-Capillary 341 (*) 70 - 99 mg/dL   Comment 1 Documented in Chart     Comment 2 Notify RN    GLUCOSE, CAPILLARY     Status: Abnormal   Collection Time    01/19/14  4:34 PM      Result Value Ref Range   Glucose-Capillary 584 (*) 70 - 99 mg/dL   Comment 1 Documented in Chart     Comment 2 Notify RN        RADIOGRAPHY: Mr Jeri Cos Wo Contrast  01/18/2014   CLINICAL DATA:  Initial evaluation for altered mental status today, weakness, inability to walk. History of breast cancer.  EXAM: MRI HEAD WITHOUT AND WITH CONTRAST  TECHNIQUE: Multiplanar, multiecho pulse sequences of the brain and surrounding structures were obtained without and with intravenous contrast.  CONTRAST:  66m MULTIHANCE GADOBENATE DIMEGLUMINE 529 MG/ML IV SOLN  COMPARISON:  Most recent MRI from 08/10/2009  FINDINGS: Mild diffuse prominence of the CSF containing spaces is compatible with generalized cerebral atrophy. Mild patchy and confluent T2/FLAIR hyperintensity within the periventricular and deep white matter of the bilateral cerebral hemispheres, likely related to remote ischemia. Small remote lacunar infarcts seen within the bilateral basal  ganglia, left greater than right, as well as the left subinsular white matter.  No abnormal foci of restricted diffusion are seen to suggest acute intracranial infarct. Gray-white matter differentiation is maintained. Normal flow voids seen within the intracranial vasculature.  An irregular multi cystic T2 hyperintense cystic mass is seen within the central cerebellum, primarily involving the median left cerebellar hemisphere, but crossing midline into the medial right cerebellar hemisphere as well. This lesion measured 2.9 (AP) x 2.6 (transverse) x 2.8 (craniocaudad) cm and demonstrates peripheral rim enhancement (series 11, image 33). Minimal blood breakdown products seen within this lesion. Smaller satellite lesion measuring 1.0 x 0.8 x 9.0 cm present within the left cerebellar hemisphere (series 10, image 37). There is a third lesion within the left cerebellar hemisphere measuring 1.2 x 0.9 x 1.0 cm. 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 No definite transependymal flow of CSF at this time. The cerebellar tonsils remain normally located above the foramen magnum without inferior herniation.  There are are 2 additional lesions within the high left frontal and parietal lobes measuring 0.8 x 0.6 x 0.9 cm and 0.6 x 0.8 x 0.7 cm respectively (series 11, image 75). Mild localized vasogenic edema. Additional small metastasis seen within the peripheral left temporal lobe measures 5 mm (series 11, image 30).  No midline shift. No extra-axial fluid collection. No intracranial hemorrhage.  Pituitary gland within normal limits. No acute abnormality seen about either orbit.  Bone marrow signal intensity demonstrates a normal appearance  IMPRESSION: 1. 2.9 x 2.6 x 2.8 cm cystic metastasis within the central  cerebellum with associated edema and mass effect on the adjacent fourth ventricle. 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 No frank transependymal flow of CSF at this time. 2. Multiple additional intracranial metastases involving the left cerebellar hemisphere, left frontal lobe, left parietal lobe, and left temporal lobe. 3. Remote basal ganglia and left subinsular infarcts. Critical Value/emergent results were called by telephone at the time of interpretation on 01/18/2014 at 10:18 pm to Dr. Rolland Porter , who verbally acknowledged these results.   Electronically Signed   By: Jeannine Boga M.D.   On: 01/18/2014 22:28   Ct Abdomen Pelvis W Contrast  01/18/2014   CLINICAL DATA:  Nausea, vomiting and generalized weakness with upper abdominal pain for 2 days. History of breast cancer.  EXAM: CT ABDOMEN AND PELVIS WITH CONTRAST  TECHNIQUE: Multidetector CT imaging of the abdomen and pelvis was performed using the standard protocol following bolus administration of intravenous contrast.  CONTRAST:  14m OMNIPAQUE IOHEXOL 300 MG/ML  SOLN  COMPARISON:  PET-CT 01/08/2014.  Abdominal pelvic CT 12/05/2012.  FINDINGS: Lung bases: Multiple metastases are present in both lung bases, similar to the recent PET-CT. A central right middle lobe component is incompletely visualized, but grossly stable. There is a stable peripherally enhancing mass within the right anterior chest wall, measuring 2.9 cm on image 8. There is no significant pleural effusion. A small pericardial effusion is present.  Liver/Biliary/Pancreas: There are numerous ill-defined low-density hepatic lesions which are worrisome for metastatic disease. The largest lesions are within the lateral segment of the left hepatic lobe (1.9 cm on image 16) and medially in the posterior segment of the right hepatic lobe (2.2 cm on image 16). There are multiple smaller  lesions. No evidence of gallstones, gallbladder wall thickening or biliary dilatation. There is enlargement of the pancreatic tail with a 1.6 cm low-density lesion on image number 25.  Spleen/Adrenal glands: Unremarkable.  Kidneys/Ureters/Bladder: Both kidneys demonstrate cortical lobularity and small cysts. There is no hydronephrosis or evidence of urinary tract calculus. The bladder appears unremarkable.  Bowel/Peritoneum: The stomach, small bowel, appendix and colon demonstrate no significant findings. There is no bowel wall thickening or surrounding inflammatory change. Moderate stool is present throughout the colon. There is no ascites.  Retroperitoneum/Pelvis: There are no enlarged abdominal or pelvic lymph nodes. There is diffuse atherosclerosis of the aorta and iliac arteries. The uterus and ovaries appear unchanged.  Abdominal wall: No abdominal wall masses or hernias.  Musculoskeletal: Osseous metastases are present, including a large lytic lesion within the left aspect of the L4 vertebral body. No significant pathologic fracture or epidural tumor identified.  IMPRESSION: 1. Widespread metastatic disease to the lung bases, liver and bones. There is a possible metastasis within the pancreatic tail. 2. No definite acute findings. 3. Grossly stable renal cortical lobularity and cysts. 4. Grossly stable atherosclerosis.   Electronically Signed   By: BCamie PatienceM.D.   On: 01/18/2014 21:26        ASSESSMENT:  1. Recurrent, metastatic breast cancer to lungs, osseous structures, and brain 2. Mental status changes, secondary to #1 3. 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  She was also started on Arimidex for a PR 1% tumor.  She reported to the ED on 10/29/2012 with SOB and she was noted to have acute systolic heart failure with an EF of 20%, believed to be  delayed cardiotoxicity from Adriamycin, despite having an EF of 55% at the completion of systemic chemotherapy.  As a result of this acute event, radiation and Arimidex were discontinued. 4. DNR  Patient Active Problem List   Diagnosis Date Noted  . Protein-calorie malnutrition, severe 01/19/2014  . Breast cancer metastasized to brain 01/18/2014  . Brain metastases 01/18/2014  . Pleuritic chest pain 12/26/2013  . Chest pain 12/26/2013  . Diarrhea 06/22/2013  . Diabetes 12/11/2012  . Chronic systolic CHF (congestive heart failure) 11/14/2012  . Smoking 11/14/2012  . Generalized weakness 11/13/2012  . CAD (coronary artery disease) 11/13/2012  . Insomnia 11/13/2012  . Congestive dilated cardiomyopathy 10/29/2012  . Breast cancer, right breast 03/19/2012  . GERD (gastroesophageal reflux disease) 02/05/2012  . Epigastric pain 02/05/2012  . Schizophrenia 02/05/2012  . Fatty liver 02/05/2012  . Hyperlipidemia 02/05/2012     PLAN:  1. I personally reviewed and went over laboratory results with the patient.  The results are noted within this dictation. 2. I personally reviewed and went over radiographic studies with the patient.  The results are noted within this dictation.   3. Chart reviewed 4. CareEverywhere data reviewed 5. Discussed the patient's case with Dr. Tressie Stalker, primary oncologist, and together we agree Hospice is the patient's best route given her extensive disease. 6. We recommend Hospice 7. Will discuss my conversation with Dr. Tressie Stalker with the patient in the AM. 8. If Hospice route is chosen, recommend Dr. Tressie Stalker or Dr. Luan Pulling be the ordering provider. 9. DNR 10. Continue Decadron 6 mg every 6 hours x 2 more dose, then decrease to 4 mg every 6 hours. 11. Will follow along as an inpatient.  All questions were answered. The patient knows to call the clinic with any problems, questions or concerns. We can certainly see the patient much sooner if  necessary.  Patient and plan discussed with Dr. Farrel Gobble and he is in agreement with the aforementioned.   KEFALAS,THOMAS 01/19/2014

## 2014-01-19 NOTE — Progress Notes (Signed)
Patient's blood sugar 443.  Notified MD of blood sugar and received order to give 20 units.  Ordered STAT blood glucose per protocol.  Will continue to monitor patient.

## 2014-01-19 NOTE — Progress Notes (Signed)
INITIAL NUTRITION ASSESSMENT  DOCUMENTATION CODES Per approved criteria  -Severe malnutrition in the context of chronic illness  Pt meets criteria for severe MALNUTRITION in the context of chronic illness as evidenced by severe muscle and subcutaneous fat depletion.  INTERVENTION:  Provide Ensure Complete po BID, each supplement provides 350 kcal and 13 grams of protein  Provide Magic cup TID with meals, each supplement provides 290 kcal and 9 grams of protein  RD to continue to monitor  NUTRITION DIAGNOSIS: Inadequate oral intake related to increased needs due to advanced cancer as evidenced by dietary recall and loss of fat/muscle stores.   Goal: Pt to meet >/= 90% of their estimated nutrition needs   Monitor:  PO and supplemental intake, Goals of care, weight, labs, I/O's  Reason for Assessment: Pt identified as at nutrition risk on the Malnutrition Screen Tool  Admitting Dx: Breast cancer metastasized to brain  ASSESSMENT: 63 y.o. female with a past medical history of metastatic breast cancer currently not on treatment, history of cardiomyopathy, with EF of 20-30% due to chemotherapy, diabetes on insulin, history of schizophrenia, who was in her usual state of health till last day or so. When she started developing abdominal pain in the upper abdomen.  While she was in the emergency department she was noted to have metastases from her breast cancer to her brain.   Pt and family were present in room during visit.  PO intake: 50%. Family states that pt has had adequate PO intake and appetite PTA, more recently her appetite has decreased. On Sunday, family reports pt only ate a bowl of potato soup. ritz crackers and Mtn Dew. The next day, pt had consumed a boiled egg and toast. Pt has been experiencing N/V and abdominal pain x 2 days.  Pt was admitted with blood sugars in the 400s, this is likely d/t Decadron use.  Family reports pt having 6 lb wt loss x 1 week. Per weight  documentation history, pt has had a 6 lb weight loss since July (5% x 3 months). This is not significant given the time frame.  Pt and family would like to receive Ensure supplements BID and magic cups BID.  Nutrition Focused Physical Exam:  Subcutaneous Fat:  Orbital Region: WNL Upper Arm Region: severe depletion Thoracic and Lumbar Region: NA  Muscle:  Temple Region: moderate depletion Clavicle Bone Region: severe depletion Clavicle and Acromion Bone Region: severe depletion Scapular Bone Region: severe depletion Dorsal Hand: mild depletion Patellar Region: NA Anterior Thigh Region: NA Posterior Calf Region: NA  Edema: no LE edema  Labs reviewed: Low Na High BUN & Creatinine Glucose 432  Height: Ht Readings from Last 1 Encounters:  01/19/14 5\' 5"  (1.651 m)    Weight: Wt Readings from Last 1 Encounters:  01/19/14 121 lb 11.2 oz (55.203 kg)    Ideal Body Weight: 125 lb  % Ideal Body Weight: 97%  Wt Readings from Last 10 Encounters:  01/19/14 121 lb 11.2 oz (55.203 kg)  12/25/13 125 lb (56.7 kg)  10/30/13 127 lb 12.8 oz (57.97 kg)  07/20/13 126 lb (57.153 kg)  06/22/13 126 lb 1.6 oz (57.199 kg)  03/16/13 130 lb 12 oz (59.308 kg)  01/29/13 130 lb 12 oz (59.308 kg)  01/06/13 132 lb 8 oz (60.102 kg)  12/24/12 133 lb (60.328 kg)  12/18/12 132 lb 6.4 oz (60.056 kg)    Usual Body Weight: 125 lb -per pt  % Usual Body Weight: 97%  BMI:  Body mass  index is 20.25 kg/(m^2).  Estimated Nutritional Needs:  Kcal: 1650-1850 Protein: 65-85g Fluid: 1.7L/day  Skin: intact  Diet Order: Dysphagia 1  EDUCATION NEEDS: -No education needs identified at this time   Intake/Output Summary (Last 24 hours) at 01/19/14 0959 Last data filed at 01/19/14 0800  Gross per 24 hour  Intake    642 ml  Output    400 ml  Net    242 ml    Last BM: 10/4  Labs:   Recent Labs Lab 01/18/14 1513 01/19/14 0505 01/19/14 0839  NA 134* 131*  --   K 4.6 5.0  --   CL 90* 91*   --   CO2 29 26  --   BUN 25* 24*  --   CREATININE 1.57* 1.38*  --   CALCIUM 10.2 9.7  --   GLUCOSE 403* 387* 432*    CBG (last 3)   Recent Labs  01/18/14 2212 01/19/14 0211 01/19/14 0820  GLUCAP 215* 256* 443*    Scheduled Meds: . aspirin  81 mg Oral Daily  . atorvastatin  40 mg Oral QHS  . dexamethasone  6 mg Intravenous 4 times per day  . digoxin  0.0625 mg Oral Daily  . docusate sodium  100 mg Oral BID  . fluPHENAZine decanoate  25 mg Intramuscular Q14 Days  . furosemide  40 mg Oral Daily  . [START ON 01/20/2014] Influenza vac split quadrivalent PF  0.5 mL Intramuscular Tomorrow-1000  . insulin aspart  0-20 Units Subcutaneous TID WC  . insulin aspart  0-5 Units Subcutaneous QHS  . insulin detemir  20 Units Subcutaneous BID  . loxapine  10 mg Oral QHS  . nortriptyline  50 mg Oral QHS  . spironolactone  25 mg Oral Daily  . temazepam  15 mg Oral QHS    Continuous Infusions: . sodium chloride      Past Medical History  Diagnosis Date  . Allergic rhinitis   . Bipolar disorder   . GERD (gastroesophageal reflux disease)   . Hypokalemia   . Hyperlipemia   . COPD (chronic obstructive pulmonary disease)   . Heart palpitations   . Anemia   . 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  . Diabetes mellitus without complication   . Coronary artery disease   . Cancer     breast  . Breast cancer     rt breast. diagnosed in 2013    Past Surgical History  Procedure Laterality Date  . Sterilization    . Colonoscopy    . Upper gastrointestinal endoscopy    . Dilation and curettage of uterus    . Colonoscopy with esophagogastroduodenoscopy (egd)  02/21/2012    Procedure: COLONOSCOPY WITH ESOPHAGOGASTRODUODENOSCOPY (EGD);  Surgeon: Rogene Houston, MD;  Location: [facility];  Service: Endoscopy;  Laterality: N/A;  2:25  . Right breast lumpectomy      benign/Dr. Arnoldo Morale ?1990's  . Breast biopsy  2013    right breast  . Portacath  placement  04/01/2012    Procedure: INSERTION PORT-A-CATH;  Surgeon: Scherry Ran, MD;  Location: [facility];  Service: General;  Laterality: Left;  Insertion Port-A-Cath Left Subclavian under Fluoroscopy    Clayton Bibles, MS, RD, PLDN Provisionally Licensed Dietitian Nutritionist Pager: [phone]

## 2014-01-19 NOTE — Progress Notes (Signed)
UR chart review completed.  

## 2014-01-19 NOTE — Care Management Note (Signed)
    Page 1 of 1   01/21/2014     2:26:47 PM CARE MANAGEMENT NOTE 01/21/2014  Patient:  [name], [name]   Account Number:  [id number]  Date Initiated:  01/19/2014  Documentation initiated by:  Theophilus Kinds  Subjective/Objective Assessment:   Pt admitted from home with abd pain and new brain mets. Pt lives with her sisters and will return home at discharge. Pt has an aide M-W-F for 3 hours a day. Pt has a walker for home use.     Action/Plan:   Pt may benefit from [facility] RN and PT at discharge. Will continue to follow for discharge planning needs.   Anticipated DC Date:  01/21/2014   Anticipated DC Plan:  [place]  CM consult      PAC Choice  HOSPICE   Choice offered to / List presented to:  C-1 Patient           [facility] agency  [facility]   Status of service:  Completed, signed off Medicare Important Message given?  YES (If response is "NO", the following Medicare IM given date fields will be blank) Date Medicare IM given:  01/21/2014 Medicare IM given by:  Vladimir Creeks Date Additional Medicare IM given:   Additional Medicare IM given by:    Discharge Disposition:  [place]  Per UR Regulation:  Reviewed for med. necessity/level of care/duration of stay  If discussed at [place] of Stay Meetings, dates discussed:    Comments:  01/20/14 [place] RN/CM Spoke with pt and sisters, visiting in the room concerning Hospice consult. They have used [facility] previously and would like to use them again. Hospice referral made 01/19/14 [place], RN BSN CM

## 2014-01-19 NOTE — Progress Notes (Signed)
Patient's blood sugar 584.  Notified MD.  Received orders to give 30 units of Novolog and recheck blood sugar in 1 hour.  Will follow orders and will continue to monitor patient.

## 2014-01-19 NOTE — Progress Notes (Signed)
Subjective: This is a 63 year old who has a history of breast cancer. She has been treated for that and developed a cardiomyopathy which is thought to be related to her chemotherapy. She has a history of diabetes on insulin schizophrenia COPD and a previous stroke. She came to the emergency department after her family brought her to my office with complaints of slurred speech difficulty ambulating multiple falls. I told them that they needed to take her to the emergency department instead of office and while she was in the emergency department she was noted to have metastases from her breast cancer to her brain.  Objective: Vital signs in last 24 hours: Temp:  [97.5 F (36.4 C)-98.5 F (36.9 C)] 98.5 F (36.9 C) (10/06 0531) Pulse Rate:  [96-108] 102 (10/06 0531) Resp:  [16-20] 18 (10/06 0531) BP: (106-147)/(66-86) 147/86 mmHg (10/06 0531) SpO2:  [92 %-98 %] 98 % (10/06 0531) Weight:  [55.203 kg (121 lb 11.2 oz)-56.7 kg (125 lb)] 55.203 kg (121 lb 11.2 oz) (10/06 0036) Weight change:  Last BM Date: 01/17/14  Intake/Output from previous day: 10/05 0701 - 10/06 0700 In: 402 [I.V.:402] Out: 400 [Urine:400]  PHYSICAL EXAM General appearance: alert, cooperative and no distress Resp: clear to auscultation bilaterally Cardio: regular rate and rhythm, S1, S2 normal, no murmur, click, rub or gallop GI: soft, non-tender; bowel sounds normal; no masses,  no organomegaly Extremities: extremities normal, atraumatic, no cyanosis or edema  Lab Results:  Results for orders placed during the hospital encounter of 01/18/14 (from the past 48 hour(s))  CBC WITH DIFFERENTIAL     Status: Abnormal   Collection Time    01/18/14  3:13 PM      Result Value Ref Range   WBC 8.8  4.0 - 10.5 K/uL   RBC 4.13  3.87 - 5.11 MIL/uL   Hemoglobin 13.2  12.0 - 15.0 g/dL   HCT 38.2  36.0 - 46.0 %   MCV 92.5  78.0 - 100.0 fL   MCH 32.0  26.0 - 34.0 pg   MCHC 34.6  30.0 - 36.0 g/dL   RDW 12.5  11.5 - 15.5 %    Platelets 310  150 - 400 K/uL   Neutrophils Relative % 80 (*) 43 - 77 %   Neutro Abs 7.0  1.7 - 7.7 K/uL   Lymphocytes Relative 15  12 - 46 %   Lymphs Abs 1.3  0.7 - 4.0 K/uL   Monocytes Relative 5  3 - 12 %   Monocytes Absolute 0.5  0.1 - 1.0 K/uL   Eosinophils Relative 0  0 - 5 %   Eosinophils Absolute 0.0  0.0 - 0.7 K/uL   Basophils Relative 0  0 - 1 %   Basophils Absolute 0.0  0.0 - 0.1 K/uL  COMPREHENSIVE METABOLIC PANEL     Status: Abnormal   Collection Time    01/18/14  3:13 PM      Result Value Ref Range   Sodium 134 (*) 137 - 147 mEq/L   Potassium 4.6  3.7 - 5.3 mEq/L   Chloride 90 (*) 96 - 112 mEq/L   CO2 29  19 - 32 mEq/L   Glucose, Bld 403 (*) 70 - 99 mg/dL   BUN 25 (*) 6 - 23 mg/dL   Creatinine, Ser 1.57 (*) 0.50 - 1.10 mg/dL   Calcium 10.2  8.4 - 10.5 mg/dL   Total Protein 8.4 (*) 6.0 - 8.3 g/dL   Albumin 4.0  3.5 - 5.2  g/dL   AST 34  0 - 37 U/L   ALT 35  0 - 35 U/L   Alkaline Phosphatase 199 (*) 39 - 117 U/L   Total Bilirubin 0.3  0.3 - 1.2 mg/dL   GFR calc non Af Amer 34 (*) >90 mL/min   GFR calc Af Amer 40 (*) >90 mL/min   Comment: (NOTE)     The eGFR has been calculated using the CKD EPI equation.     This calculation has not been validated in all clinical situations.     eGFR's persistently <90 mL/min signify possible Chronic Kidney     Disease.   Anion gap 15  5 - 15  URINALYSIS, ROUTINE W REFLEX MICROSCOPIC     Status: None   Collection Time    01/18/14 10:03 PM      Result Value Ref Range   Color, Urine YELLOW  YELLOW   APPearance CLEAR  CLEAR   Specific Gravity, Urine 1.010  1.005 - 1.030   pH 7.5  5.0 - 8.0   Glucose, UA NEGATIVE  NEGATIVE mg/dL   Hgb urine dipstick NEGATIVE  NEGATIVE   Bilirubin Urine NEGATIVE  NEGATIVE   Ketones, ur NEGATIVE  NEGATIVE mg/dL   Protein, ur NEGATIVE  NEGATIVE mg/dL   Urobilinogen, UA 0.2  0.0 - 1.0 mg/dL   Nitrite NEGATIVE  NEGATIVE   Leukocytes, UA NEGATIVE  NEGATIVE   Comment: MICROSCOPIC NOT DONE ON  URINES WITH NEGATIVE PROTEIN, BLOOD, LEUKOCYTES, NITRITE, OR GLUCOSE <1000 mg/dL.  CBG MONITORING, ED     Status: Abnormal   Collection Time    01/18/14 10:12 PM      Result Value Ref Range   Glucose-Capillary 215 (*) 70 - 99 mg/dL  GLUCOSE, CAPILLARY     Status: Abnormal   Collection Time    01/19/14  2:11 AM      Result Value Ref Range   Glucose-Capillary 256 (*) 70 - 99 mg/dL  DIGOXIN LEVEL     Status: None   Collection Time    01/19/14  5:05 AM      Result Value Ref Range   Digoxin Level 0.9  0.8 - 2.0 ng/mL  COMPREHENSIVE METABOLIC PANEL     Status: Abnormal   Collection Time    01/19/14  5:05 AM      Result Value Ref Range   Sodium 131 (*) 137 - 147 mEq/L   Potassium 5.0  3.7 - 5.3 mEq/L   Chloride 91 (*) 96 - 112 mEq/L   CO2 26  19 - 32 mEq/L   Glucose, Bld 387 (*) 70 - 99 mg/dL   BUN 24 (*) 6 - 23 mg/dL   Creatinine, Ser 1.38 (*) 0.50 - 1.10 mg/dL   Calcium 9.7  8.4 - 10.5 mg/dL   Total Protein 8.1  6.0 - 8.3 g/dL   Albumin 3.8  3.5 - 5.2 g/dL   AST 28  0 - 37 U/L   ALT 32  0 - 35 U/L   Alkaline Phosphatase 191 (*) 39 - 117 U/L   Total Bilirubin 0.3  0.3 - 1.2 mg/dL   GFR calc non Af Amer 40 (*) >90 mL/min   GFR calc Af Amer 46 (*) >90 mL/min   Comment: (NOTE)     The eGFR has been calculated using the CKD EPI equation.     This calculation has not been validated in all clinical situations.     eGFR's persistently <90 mL/min signify possible  Chronic Kidney     Disease.   Anion gap 14  5 - 15  CBC     Status: None   Collection Time    01/19/14  5:05 AM      Result Value Ref Range   WBC 9.0  4.0 - 10.5 K/uL   RBC 3.98  3.87 - 5.11 MIL/uL   Hemoglobin 12.6  12.0 - 15.0 g/dL   HCT 36.4  36.0 - 46.0 %   MCV 91.5  78.0 - 100.0 fL   MCH 31.7  26.0 - 34.0 pg   MCHC 34.6  30.0 - 36.0 g/dL   RDW 12.5  11.5 - 15.5 %   Platelets 309  150 - 400 K/uL  GLUCOSE, CAPILLARY     Status: Abnormal   Collection Time    01/19/14  8:20 AM      Result Value Ref Range    Glucose-Capillary 443 (*) 70 - 99 mg/dL   Comment 1 Documented in Chart     Comment 2 Notify RN      ABGS No results found for this basename: PHART, PCO2, PO2ART, TCO2, HCO3,  in the last 72 hours CULTURES No results found for this or any previous visit (from the past 240 hour(s)). Studies/Results: Mr Jeri Cos Wo Contrast  01/18/2014   CLINICAL DATA:  Initial evaluation for altered mental status today, weakness, inability to walk. History of breast cancer.  EXAM: MRI HEAD WITHOUT AND WITH CONTRAST  TECHNIQUE: Multiplanar, multiecho pulse sequences of the brain and surrounding structures were obtained without and with intravenous contrast.  CONTRAST:  91m MULTIHANCE GADOBENATE DIMEGLUMINE 529 MG/ML IV SOLN  COMPARISON:  Most recent MRI from 08/10/2009  FINDINGS: Mild diffuse prominence of the CSF containing spaces is compatible with generalized cerebral atrophy. Mild patchy and confluent T2/FLAIR hyperintensity within the periventricular and deep white matter of the bilateral cerebral hemispheres, likely related to remote ischemia. Small remote lacunar infarcts seen within the bilateral basal ganglia, left greater than right, as well as the left subinsular white matter.  No abnormal foci of restricted diffusion are seen to suggest acute intracranial infarct. Gray-white matter differentiation is maintained. Normal flow voids seen within the intracranial vasculature.  An irregular multi cystic T2 hyperintense cystic mass is seen within the central cerebellum, primarily involving the median left cerebellar hemisphere, but crossing midline into the medial right cerebellar hemisphere as well. This lesion measured 2.9 (AP) x 2.6 (transverse) x 2.8 (craniocaudad) cm and demonstrates peripheral rim enhancement (series 11, image 33). Minimal blood breakdown products seen within this lesion. Smaller satellite lesion measuring 1.0 x 0.8 x 9.0 cm present within the left cerebellar hemisphere (series 10, image 37).  There is a third lesion within the left cerebellar hemisphere measuring 1.2 x 0.9 x 1.0 cm. 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 No definite transependymal flow of CSF at this time. The cerebellar tonsils remain normally located above the foramen magnum without inferior herniation.  There are are 2 additional lesions within the high left frontal and parietal lobes measuring 0.8 x 0.6 x 0.9 cm and 0.6 x 0.8 x 0.7 cm respectively (series 11, image 75). Mild localized vasogenic edema. Additional small metastasis seen within the peripheral left temporal lobe measures 5 mm (series 11, image 30).  No midline shift. No extra-axial fluid collection. No intracranial  hemorrhage.  Pituitary gland within normal limits. No acute abnormality seen about either orbit.  Bone marrow signal intensity demonstrates a normal appearance  IMPRESSION: 1. 2.9 x 2.6 x 2.8 cm cystic metastasis within the central cerebellum with associated edema and mass effect on the adjacent fourth ventricle. 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 No frank transependymal flow of CSF at this time. 2. Multiple additional intracranial metastases involving the left cerebellar hemisphere, left frontal lobe, left parietal lobe, and left temporal lobe. 3. Remote basal ganglia and left subinsular infarcts. Critical Value/emergent results were called by telephone at the time of interpretation on 01/18/2014 at 10:18 pm to Dr. Rolland Porter , who verbally acknowledged these results.   Electronically Signed   By: Jeannine Boga M.D.   On: 01/18/2014 22:28   Ct Abdomen Pelvis W Contrast  01/18/2014   CLINICAL DATA:  Nausea, vomiting and generalized  weakness with upper abdominal pain for 2 days. History of breast cancer.  EXAM: CT ABDOMEN AND PELVIS WITH CONTRAST  TECHNIQUE: Multidetector CT imaging of the abdomen and pelvis was performed using the standard protocol following bolus administration of intravenous contrast.  CONTRAST:  69m OMNIPAQUE IOHEXOL 300 MG/ML  SOLN  COMPARISON:  PET-CT 01/08/2014.  Abdominal pelvic CT 12/05/2012.  FINDINGS: Lung bases: Multiple metastases are present in both lung bases, similar to the recent PET-CT. A central right middle lobe component is incompletely visualized, but grossly stable. There is a stable peripherally enhancing mass within the right anterior chest wall, measuring 2.9 cm on image 8. There is no significant pleural effusion. A small pericardial effusion is present.  Liver/Biliary/Pancreas: There are numerous ill-defined low-density hepatic lesions which are worrisome for metastatic disease. The largest lesions are within the lateral segment of the left hepatic lobe (1.9 cm on image 16) and medially in the posterior segment of the right hepatic lobe (2.2 cm on image 16). There are multiple smaller lesions. No evidence of gallstones, gallbladder wall thickening or biliary dilatation. There is enlargement of the pancreatic tail with a 1.6 cm low-density lesion on image number 25.  Spleen/Adrenal glands: Unremarkable.  Kidneys/Ureters/Bladder: Both kidneys demonstrate cortical lobularity and small cysts. There is no hydronephrosis or evidence of urinary tract calculus. The bladder appears unremarkable.  Bowel/Peritoneum: The stomach, small bowel, appendix and colon demonstrate no significant findings. There is no bowel wall thickening or surrounding inflammatory change. Moderate stool is present throughout the colon. There is no ascites.  Retroperitoneum/Pelvis: There are no enlarged abdominal or pelvic lymph nodes. There is diffuse atherosclerosis of the aorta and iliac arteries. The uterus and ovaries appear  unchanged.  Abdominal wall: No abdominal wall masses or hernias.  Musculoskeletal: Osseous metastases are present, including a large lytic lesion within the left aspect of the L4 vertebral body. No significant pathologic fracture or epidural tumor identified.  IMPRESSION: 1. Widespread metastatic disease to the lung bases, liver and bones. There is a possible metastasis within the pancreatic tail. 2. No definite acute findings. 3. Grossly stable renal cortical lobularity and cysts. 4. Grossly stable atherosclerosis.   Electronically Signed   By: BCamie PatienceM.D.   On: 01/18/2014 21:26    Medications:  Prior to Admission:  Prescriptions prior to admission  Medication Sig Dispense Refill  . aspirin 81 MG chewable tablet Chew 1 tablet (81 mg total) by mouth daily.      .Marland Kitchenatorvastatin (LIPITOR) 40 MG tablet Take 40 mg  by mouth at bedtime.      . digoxin (LANOXIN) 0.125 MG tablet Take 0.5 tablets (0.0625 mg total) by mouth daily.  15 tablet  6  . docusate sodium (COLACE) 100 MG capsule Take 100 mg by mouth at bedtime.      . fluPHENAZine decanoate (PROLIXIN) 25 MG/ML injection Inject 25 mg into the muscle every 14 (fourteen) days.      . furosemide (LASIX) 40 MG tablet Take 1 tablet (40 mg total) by mouth daily.  30 tablet    . glipiZIDE (GLUCOTROL) 5 MG tablet Take 5 mg by mouth 2 (two) times daily before a meal.      . insulin detemir (LEVEMIR) 100 UNIT/ML injection Inject 40 Units into the skin daily.       Marland Kitchen lisinopril (PRINIVIL,ZESTRIL) 2.5 MG tablet Take 1.25 mg by mouth 2 (two) times daily. Take 1/2 tab in AM and 1/2 tab in PM      . loratadine (CLARITIN) 10 MG tablet Take 10 mg by mouth daily.      Marland Kitchen loxapine (LOXITANE) 10 MG capsule Take 10 mg by mouth at bedtime.      . magnesium oxide (MAG-OX) 400 MG tablet Take 400 mg by mouth daily.      . nortriptyline (PAMELOR) 50 MG capsule Take 50 mg by mouth at bedtime.       Marland Kitchen omeprazole (PRILOSEC) 20 MG capsule Take 20 mg by mouth daily.      .  polyvinyl alcohol (LIQUIFILM TEARS) 1.4 % ophthalmic solution Place 1 drop into both eyes 2 (two) times daily as needed.  15 mL  0  . potassium chloride SA (K-DUR,KLOR-CON) 20 MEQ tablet Take 1 tablet (20 mEq total) by mouth daily.  30 tablet  6  . Propylene Glycol (SYSTANE BALANCE) 0.6 % SOLN Apply 1 drop to eye 2 (two) times daily as needed (dry eyes).      Marland Kitchen spironolactone (ALDACTONE) 25 MG tablet Take 1 tablet (25 mg total) by mouth daily.  30 tablet  6  . temazepam (RESTORIL) 15 MG capsule Take 15 mg by mouth at bedtime.      . traMADol (ULTRAM) 50 MG tablet Take 1 tablet (50 mg total) by mouth every 6 (six) hours as needed for moderate pain.  30 tablet  5  . traZODone (DESYREL) 150 MG tablet Take 150 mg by mouth at bedtime.      Marland Kitchen denosumab (PROLIA) 60 MG/ML SOLN injection Inject 60 mg into the skin every 6 (six) months. Administer in upper arm, thigh, or abdomen      . heparin lock flush 100 UNIT/ML SOLN 5 mLs (500 Units total) by Intracatheter route every 30 (thirty) days.      . ramelteon (ROZEREM) 8 MG tablet Take 8 mg by mouth at bedtime as needed for sleep.        Scheduled: . aspirin  81 mg Oral Daily  . atorvastatin  40 mg Oral QHS  . dexamethasone  6 mg Intravenous 4 times per day  . digoxin  0.0625 mg Oral Daily  . docusate sodium  100 mg Oral BID  . fluPHENAZine decanoate  25 mg Intramuscular Q14 Days  . furosemide  40 mg Oral Daily  . [START ON 01/20/2014] Influenza vac split quadrivalent PF  0.5 mL Intramuscular Tomorrow-1000  . insulin aspart  0-20 Units Subcutaneous TID WC  . insulin aspart  0-5 Units Subcutaneous QHS  . insulin detemir  20 Units Subcutaneous BID  .  loxapine  10 mg Oral QHS  . nortriptyline  50 mg Oral QHS  . spironolactone  25 mg Oral Daily  . temazepam  15 mg Oral QHS   Continuous: . sodium chloride     ZOQ:XLLIYIYUWCNPS, acetaminophen, morphine injection, ondansetron (ZOFRAN) IV, ondansetron, oxyCODONE  Assesment: She has breast cancer with  newly diagnosed metastatic disease to her brain. She is on Decadron and says she feels better but has been having significant difficulty with her blood sugar because of that. She had slurred speech and trouble ambulating. At baseline she is known to have breast cancer schizophrenia and congestive heart failure. She also has COPD and had a stroke some years ago. Principal Problem:   Breast cancer metastasized to brain Active Problems:   GERD (gastroesophageal reflux disease)   Schizophrenia   Breast cancer, right breast   Congestive dilated cardiomyopathy   Brain metastases    Plan: Continue Decadron. Oncology consultation. I will increase her sliding scale insulin.    LOS: 1 day   Ladesha Pacini L 01/19/2014, 8:37 AM

## 2014-01-19 NOTE — Progress Notes (Signed)
Rechecked blood sugar and is 508.  Notified MD and received order to give 20 units of Novolog and notify oncoming nurse.  Will follow orders and continue to monitor patient.

## 2014-01-20 DIAGNOSIS — C7931 Secondary malignant neoplasm of brain: Secondary | ICD-10-CM | POA: Diagnosis not present

## 2014-01-20 LAB — GLUCOSE, CAPILLARY
GLUCOSE-CAPILLARY: 488 mg/dL — AB (ref 70–99)
Glucose-Capillary: 297 mg/dL — ABNORMAL HIGH (ref 70–99)
Glucose-Capillary: 369 mg/dL — ABNORMAL HIGH (ref 70–99)
Glucose-Capillary: 298 mg/dL — ABNORMAL HIGH (ref 70–99)

## 2014-01-20 MED ORDER — DEXAMETHASONE 4 MG PO TABS
4.0000 mg | ORAL_TABLET | Freq: Four times a day (QID) | ORAL | Status: DC
  Administered 2014-01-20 – 2014-01-21 (×5): 4 mg via ORAL
  Filled 2014-01-20 (×10): qty 1

## 2014-01-20 MED ORDER — INSULIN ASPART 100 UNIT/ML ~~LOC~~ SOLN
30.0000 [IU] | Freq: Once | SUBCUTANEOUS | Status: AC
  Administered 2014-01-20: 30 [IU] via SUBCUTANEOUS

## 2014-01-20 MED ORDER — MORPHINE SULFATE 15 MG PO TABS
15.0000 mg | ORAL_TABLET | ORAL | Status: DC | PRN
  Administered 2014-01-20 – 2014-01-21 (×2): 15 mg via ORAL
  Filled 2014-01-20 (×2): qty 1

## 2014-01-20 NOTE — Progress Notes (Signed)
Subjective: Patient seen in bed this AM.  No family at the bedside.  She reports one episode of pain in her right arm early this AM.  She reported it to the nurse and I see that she received 2 mg of morphine at 0700 hours.  She reports her pain is no longer present so I suspect her current inpatient pain regimen is controlling symptoms well.  She requests Aleve PRN because "that is what Dr. Tressie Stalker told me to take."  This can be added as an outpatient.  I spoke with Dr. Tressie Stalker, primary oncologist, via telephone last night and he agrees that [name] would benefit from Hospice services.  She is not a candidate for systemic therapy and therefore palliative radiation to brain metastases would not only be futile, but may extend life, and poor quality of life at that, to a point where her bony metastatic disease will cause significant pain and suffering.  Therefore, not treatment other than comfort care is recommended.  Dr. Tressie Stalker is on board.  Objective: Vital signs in last 24 hours: Temp:  [98.1 F (36.7 C)-98.6 F (37 C)] 98.6 F (37 C) (10/07 0542) Pulse Rate:  [71-113] 71 (10/07 0542) Resp:  [18-20] 20 (10/07 0542) BP: (118-141)/(65-76) 141/76 mmHg (10/07 0542) SpO2:  [97 %-98 %] 98 % (10/07 0542)  Intake/Output from previous day: 10/06 0800 - 10/07 0759 In: 720 [P.O.:720] Out: 1600 [Urine:1600] Intake/Output this shift:    General appearance: alert, cooperative, appears older than stated age, cachectic and slowed mentation  Lab Results:   Recent Labs  01/18/14 1513 01/19/14 0505  WBC 8.8 9.0  HGB 13.2 12.6  HCT 38.2 36.4  PLT 310 309   BMET  Recent Labs  01/18/14 1513 01/19/14 0505 01/19/14 0839  NA 134* 131*  --   K 4.6 5.0  --   CL 90* 91*  --   CO2 29 26  --   GLUCOSE 403* 387* 432*  BUN 25* 24*  --   CREATININE 1.57* 1.38*  --   CALCIUM 10.2 9.7  --     Studies/Results: Mr Jeri Cos Wo Contrast  01/18/2014   CLINICAL DATA:  Initial evaluation for  altered mental status today, weakness, inability to walk. History of breast cancer.  EXAM: MRI HEAD WITHOUT AND WITH CONTRAST  TECHNIQUE: Multiplanar, multiecho pulse sequences of the brain and surrounding structures were obtained without and with intravenous contrast.  CONTRAST:  69mL MULTIHANCE GADOBENATE DIMEGLUMINE 529 MG/ML IV SOLN  COMPARISON:  Most recent MRI from 08/10/2009  FINDINGS: Mild diffuse prominence of the CSF containing spaces is compatible with generalized cerebral atrophy. Mild patchy and confluent T2/FLAIR hyperintensity within the periventricular and deep white matter of the bilateral cerebral hemispheres, likely related to remote ischemia. Small remote lacunar infarcts seen within the bilateral basal ganglia, left greater than right, as well as the left subinsular white matter.  No abnormal foci of restricted diffusion are seen to suggest acute intracranial infarct. Gray-white matter differentiation is maintained. Normal flow voids seen within the intracranial vasculature.  An irregular multi cystic T2 hyperintense cystic mass is seen within the central cerebellum, primarily involving the median left cerebellar hemisphere, but crossing midline into the medial right cerebellar hemisphere as well. This lesion measured 2.9 (AP) x 2.6 (transverse) x 2.8 (craniocaudad) cm and demonstrates peripheral rim enhancement (series 11, image 33). Minimal blood breakdown products seen within this lesion. Smaller satellite lesion measuring 1.0 x 0.8 x 9.0 cm present within the left cerebellar hemisphere (  series 10, image 37). There is a third lesion within the left cerebellar hemisphere measuring 1.2 x 0.9 x 1.0 cm. 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 No definite transependymal flow of CSF at  this time. The cerebellar tonsils remain normally located above the foramen magnum without inferior herniation.  There are are 2 additional lesions within the high left frontal and parietal lobes measuring 0.8 x 0.6 x 0.9 cm and 0.6 x 0.8 x 0.7 cm respectively (series 11, image 75). Mild localized vasogenic edema. Additional small metastasis seen within the peripheral left temporal lobe measures 5 mm (series 11, image 30).  No midline shift. No extra-axial fluid collection. No intracranial hemorrhage.  Pituitary gland within normal limits. No acute abnormality seen about either orbit.  Bone marrow signal intensity demonstrates a normal appearance  IMPRESSION: 1. 2.9 x 2.6 x 2.8 cm cystic metastasis within the central cerebellum with associated edema and mass effect on the adjacent fourth ventricle. 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 No frank transependymal flow of CSF at this time. 2. Multiple additional intracranial metastases involving the left cerebellar hemisphere, left frontal lobe, left parietal lobe, and left temporal lobe. 3. Remote basal ganglia and left subinsular infarcts. Critical Value/emergent results were called by telephone at the time of interpretation on 01/18/2014 at 10:18 pm to Dr. Rolland Porter , who verbally acknowledged these results.   Electronically Signed   By: Jeannine Boga M.D.   On: 01/18/2014 22:28   Ct Abdomen Pelvis W Contrast  01/18/2014   CLINICAL DATA:  Nausea, vomiting and generalized weakness with upper abdominal pain for 2 days. History of breast cancer.  EXAM: CT ABDOMEN AND PELVIS WITH CONTRAST  TECHNIQUE: Multidetector CT imaging of the abdomen and pelvis was performed using the standard protocol following bolus administration of intravenous contrast.  CONTRAST:  10mL OMNIPAQUE IOHEXOL 300 MG/ML  SOLN  COMPARISON:  PET-CT 01/08/2014.  Abdominal pelvic CT  12/05/2012.  FINDINGS: Lung bases: Multiple metastases are present in both lung bases, similar to the recent PET-CT. A central right middle lobe component is incompletely visualized, but grossly stable. There is a stable peripherally enhancing mass within the right anterior chest wall, measuring 2.9 cm on image 8. There is no significant pleural effusion. A small pericardial effusion is present.  Liver/Biliary/Pancreas: There are numerous ill-defined low-density hepatic lesions which are worrisome for metastatic disease. The largest lesions are within the lateral segment of the left hepatic lobe (1.9 cm on image 16) and medially in the posterior segment of the right hepatic lobe (2.2 cm on image 16). There are multiple smaller lesions. No evidence of gallstones, gallbladder wall thickening or biliary dilatation. There is enlargement of the pancreatic tail with a 1.6 cm low-density lesion on image number 25.  Spleen/Adrenal glands: Unremarkable.  Kidneys/Ureters/Bladder: Both kidneys demonstrate cortical lobularity and small cysts. There is no hydronephrosis or evidence of urinary tract calculus. The bladder appears unremarkable.  Bowel/Peritoneum: The stomach, small bowel, appendix and colon demonstrate no significant findings. There is no bowel wall thickening or surrounding inflammatory change. Moderate stool is present throughout the colon. There is no ascites.  Retroperitoneum/Pelvis: There are no enlarged abdominal or pelvic lymph nodes. There is diffuse atherosclerosis of the aorta and iliac arteries. The uterus and ovaries  appear unchanged.  Abdominal wall: No abdominal wall masses or hernias.  Musculoskeletal: Osseous metastases are present, including a large lytic lesion within the left aspect of the L4 vertebral body. No significant pathologic fracture or epidural tumor identified.  IMPRESSION: 1. Widespread metastatic disease to the lung bases, liver and bones. There is a possible metastasis within the  pancreatic tail. 2. No definite acute findings. 3. Grossly stable renal cortical lobularity and cysts. 4. Grossly stable atherosclerosis.   Electronically Signed   By: Camie Patience M.D.   On: 01/18/2014 21:26    Medications: I have reviewed the patient's current medications.  Assessment/Plan: 1. Recurrent, metastatic breast cancer to lungs, osseous structures, and brain.  Not a candidate for chemotherapy or palliative radiation therapy.  Recommend Hospice.  Terminal prognosis with less than 6 months of life expected and more likely less than 3 months.   2. Mental status changes, secondary to #1.  On Dexamethasone 6 mg every 6 hours.  Will decrease dose after noon dose today (10/7) to 4 mg every 6 hours.   3. 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 She was also started on Arimidex for a PR 1% tumor. She reported to the ED on 10/29/2012 with SOB and she was noted to have acute systolic heart failure with an EF of 20%, believed to be delayed cardiotoxicity from Adriamycin, despite having an EF of 55% at the completion of systemic chemotherapy. As a result of this acute event, radiation and Arimidex were discontinued.  4. DNR  Patient and plan discussed with Dr.Michael Heller and he is in agreement with the aforementioned.     LOS: 2 days    KEFALAS,THOMAS 01/20/2014

## 2014-01-20 NOTE — Progress Notes (Signed)
Pain has been addressed throughout the day and patient states that she is not in pain.  Will continue to monitor.

## 2014-01-20 NOTE — Progress Notes (Signed)
SLP Cancellation Note  Patient Details Name: [name] MRN: [id number] DOB: Dec 12, 1950   Cancelled treatment:       Reason Eval/Treat Not Completed: Other (comment); SLP spoke with RN who reports that pt is tolerating diet and that pt is comfort care. Will defer BSE at this time. Please re-consult should needs arise.  Thank you,  Genene Churn, [place]    [place] 01/20/2014, 3:20 PM

## 2014-01-20 NOTE — Progress Notes (Signed)
Blood sugar 488.  Notified MD.  Received orders to give 30 units.  Will follow orders and continue to monitor patient.

## 2014-01-20 NOTE — Progress Notes (Signed)
Subjective: This morning she has pain in her right rib cage and in her right arm. She says the oxycodone makes her sick. Morphine has worked.  Objective: Vital signs in last 24 hours: Temp:  [98.1 F (36.7 C)-98.6 F (37 C)] 98.6 F (37 C) (10/07 0542) Pulse Rate:  [71-113] 71 (10/07 0542) Resp:  [18-20] 20 (10/07 0542) BP: (118-141)/(65-76) 141/76 mmHg (10/07 0542) SpO2:  [97 %-98 %] 98 % (10/07 0542) Weight change:  Last BM Date: 01/17/14  Intake/Output from previous day: 10/06 0701 - 10/07 0700 In: 720 [P.O.:720] Out: 1300 [Urine:1300]  PHYSICAL EXAM General appearance: alert, cooperative and mild distress Resp: clear to auscultation bilaterally Cardio: regular rate and rhythm, S1, S2 normal, no murmur, click, rub or gallop GI: soft, non-tender; bowel sounds normal; no masses,  no organomegaly Extremities: extremities normal, atraumatic, no cyanosis or edema  Lab Results:  Results for orders placed during the hospital encounter of 01/18/14 (from the past 48 hour(s))  CBC WITH DIFFERENTIAL     Status: Abnormal   Collection Time    01/18/14  3:13 PM      Result Value Ref Range   WBC 8.8  4.0 - 10.5 K/uL   RBC 4.13  3.87 - 5.11 MIL/uL   Hemoglobin 13.2  12.0 - 15.0 g/dL   HCT 38.2  36.0 - 46.0 %   MCV 92.5  78.0 - 100.0 fL   MCH 32.0  26.0 - 34.0 pg   MCHC 34.6  30.0 - 36.0 g/dL   RDW 12.5  11.5 - 15.5 %   Platelets 310  150 - 400 K/uL   Neutrophils Relative % 80 (*) 43 - 77 %   Neutro Abs 7.0  1.7 - 7.7 K/uL   Lymphocytes Relative 15  12 - 46 %   Lymphs Abs 1.3  0.7 - 4.0 K/uL   Monocytes Relative 5  3 - 12 %   Monocytes Absolute 0.5  0.1 - 1.0 K/uL   Eosinophils Relative 0  0 - 5 %   Eosinophils Absolute 0.0  0.0 - 0.7 K/uL   Basophils Relative 0  0 - 1 %   Basophils Absolute 0.0  0.0 - 0.1 K/uL  COMPREHENSIVE METABOLIC PANEL     Status: Abnormal   Collection Time    01/18/14  3:13 PM      Result Value Ref Range   Sodium 134 (*) 137 - 147 mEq/L   Potassium  4.6  3.7 - 5.3 mEq/L   Chloride 90 (*) 96 - 112 mEq/L   CO2 29  19 - 32 mEq/L   Glucose, Bld 403 (*) 70 - 99 mg/dL   BUN 25 (*) 6 - 23 mg/dL   Creatinine, Ser 1.57 (*) 0.50 - 1.10 mg/dL   Calcium 10.2  8.4 - 10.5 mg/dL   Total Protein 8.4 (*) 6.0 - 8.3 g/dL   Albumin 4.0  3.5 - 5.2 g/dL   AST 34  0 - 37 U/L   ALT 35  0 - 35 U/L   Alkaline Phosphatase 199 (*) 39 - 117 U/L   Total Bilirubin 0.3  0.3 - 1.2 mg/dL   GFR calc non Af Amer 34 (*) >90 mL/min   GFR calc Af Amer 40 (*) >90 mL/min   Comment: (NOTE)     The eGFR has been calculated using the CKD EPI equation.     This calculation has not been validated in all clinical situations.     eGFR's persistently <  90 mL/min signify possible Chronic Kidney     Disease.   Anion gap 15  5 - 15  URINALYSIS, ROUTINE W REFLEX MICROSCOPIC     Status: None   Collection Time    01/18/14 10:03 PM      Result Value Ref Range   Color, Urine YELLOW  YELLOW   APPearance CLEAR  CLEAR   Specific Gravity, Urine 1.010  1.005 - 1.030   pH 7.5  5.0 - 8.0   Glucose, UA NEGATIVE  NEGATIVE mg/dL   Hgb urine dipstick NEGATIVE  NEGATIVE   Bilirubin Urine NEGATIVE  NEGATIVE   Ketones, ur NEGATIVE  NEGATIVE mg/dL   Protein, ur NEGATIVE  NEGATIVE mg/dL   Urobilinogen, UA 0.2  0.0 - 1.0 mg/dL   Nitrite NEGATIVE  NEGATIVE   Leukocytes, UA NEGATIVE  NEGATIVE   Comment: MICROSCOPIC NOT DONE ON URINES WITH NEGATIVE PROTEIN, BLOOD, LEUKOCYTES, NITRITE, OR GLUCOSE <1000 mg/dL.  CBG MONITORING, ED     Status: Abnormal   Collection Time    01/18/14 10:12 PM      Result Value Ref Range   Glucose-Capillary 215 (*) 70 - 99 mg/dL  GLUCOSE, CAPILLARY     Status: Abnormal   Collection Time    01/19/14  2:11 AM      Result Value Ref Range   Glucose-Capillary 256 (*) 70 - 99 mg/dL  DIGOXIN LEVEL     Status: None   Collection Time    01/19/14  5:05 AM      Result Value Ref Range   Digoxin Level 0.9  0.8 - 2.0 ng/mL  COMPREHENSIVE METABOLIC PANEL     Status:  Abnormal   Collection Time    01/19/14  5:05 AM      Result Value Ref Range   Sodium 131 (*) 137 - 147 mEq/L   Potassium 5.0  3.7 - 5.3 mEq/L   Chloride 91 (*) 96 - 112 mEq/L   CO2 26  19 - 32 mEq/L   Glucose, Bld 387 (*) 70 - 99 mg/dL   BUN 24 (*) 6 - 23 mg/dL   Creatinine, Ser 1.38 (*) 0.50 - 1.10 mg/dL   Calcium 9.7  8.4 - 10.5 mg/dL   Total Protein 8.1  6.0 - 8.3 g/dL   Albumin 3.8  3.5 - 5.2 g/dL   AST 28  0 - 37 U/L   ALT 32  0 - 35 U/L   Alkaline Phosphatase 191 (*) 39 - 117 U/L   Total Bilirubin 0.3  0.3 - 1.2 mg/dL   GFR calc non Af Amer 40 (*) >90 mL/min   GFR calc Af Amer 46 (*) >90 mL/min   Comment: (NOTE)     The eGFR has been calculated using the CKD EPI equation.     This calculation has not been validated in all clinical situations.     eGFR's persistently <90 mL/min signify possible Chronic Kidney     Disease.   Anion gap 14  5 - 15  CBC     Status: None   Collection Time    01/19/14  5:05 AM      Result Value Ref Range   WBC 9.0  4.0 - 10.5 K/uL   RBC 3.98  3.87 - 5.11 MIL/uL   Hemoglobin 12.6  12.0 - 15.0 g/dL   HCT 36.4  36.0 - 46.0 %   MCV 91.5  78.0 - 100.0 fL   MCH 31.7  26.0 - 34.0 pg  MCHC 34.6  30.0 - 36.0 g/dL   RDW 12.5  11.5 - 15.5 %   Platelets 309  150 - 400 K/uL  GLUCOSE, CAPILLARY     Status: Abnormal   Collection Time    01/19/14  8:20 AM      Result Value Ref Range   Glucose-Capillary 443 (*) 70 - 99 mg/dL   Comment 1 Documented in Chart     Comment 2 Notify RN    GLUCOSE, RANDOM     Status: Abnormal   Collection Time    01/19/14  8:39 AM      Result Value Ref Range   Glucose, Bld 432 (*) 70 - 99 mg/dL  GLUCOSE, CAPILLARY     Status: Abnormal   Collection Time    01/19/14 11:25 AM      Result Value Ref Range   Glucose-Capillary 341 (*) 70 - 99 mg/dL   Comment 1 Documented in Chart     Comment 2 Notify RN    GLUCOSE, CAPILLARY     Status: Abnormal   Collection Time    01/19/14  4:34 PM      Result Value Ref Range    Glucose-Capillary 584 (*) 70 - 99 mg/dL   Comment 1 Documented in Chart     Comment 2 Notify RN    GLUCOSE, CAPILLARY     Status: Abnormal   Collection Time    01/19/14  5:59 PM      Result Value Ref Range   Glucose-Capillary 508 (*) 70 - 99 mg/dL   Comment 1 Documented in Chart    GLUCOSE, CAPILLARY     Status: Abnormal   Collection Time    01/19/14  9:52 PM      Result Value Ref Range   Glucose-Capillary 201 (*) 70 - 99 mg/dL  GLUCOSE, CAPILLARY     Status: Abnormal   Collection Time    01/20/14  7:24 AM      Result Value Ref Range   Glucose-Capillary 297 (*) 70 - 99 mg/dL   Comment 1 Notify RN      ABGS No results found for this basename: PHART, PCO2, PO2ART, TCO2, HCO3,  in the last 72 hours CULTURES No results found for this or any previous visit (from the past 240 hour(s)). Studies/Results: Mr Jeri Cos Wo Contrast  01/18/2014   CLINICAL DATA:  Initial evaluation for altered mental status today, weakness, inability to walk. History of breast cancer.  EXAM: MRI HEAD WITHOUT AND WITH CONTRAST  TECHNIQUE: Multiplanar, multiecho pulse sequences of the brain and surrounding structures were obtained without and with intravenous contrast.  CONTRAST:  25m MULTIHANCE GADOBENATE DIMEGLUMINE 529 MG/ML IV SOLN  COMPARISON:  Most recent MRI from 08/10/2009  FINDINGS: Mild diffuse prominence of the CSF containing spaces is compatible with generalized cerebral atrophy. Mild patchy and confluent T2/FLAIR hyperintensity within the periventricular and deep white matter of the bilateral cerebral hemispheres, likely related to remote ischemia. Small remote lacunar infarcts seen within the bilateral basal ganglia, left greater than right, as well as the left subinsular white matter.  No abnormal foci of restricted diffusion are seen to suggest acute intracranial infarct. Gray-white matter differentiation is maintained. Normal flow voids seen within the intracranial vasculature.  An irregular multi cystic  T2 hyperintense cystic mass is seen within the central cerebellum, primarily involving the median left cerebellar hemisphere, but crossing midline into the medial right cerebellar hemisphere as well. This lesion measured 2.9 (AP) x 2.6 (transverse)  x 2.8 (craniocaudad) cm and demonstrates peripheral rim enhancement (series 11, image 33). Minimal blood breakdown products seen within this lesion. Smaller satellite lesion measuring 1.0 x 0.8 x 9.0 cm present within the left cerebellar hemisphere (series 10, image 37). There is a third lesion within the left cerebellar hemisphere measuring 1.2 x 0.9 x 1.0 cm. 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 No definite transependymal flow of CSF at this time. The cerebellar tonsils remain normally located above the foramen magnum without inferior herniation.  There are are 2 additional lesions within the high left frontal and parietal lobes measuring 0.8 x 0.6 x 0.9 cm and 0.6 x 0.8 x 0.7 cm respectively (series 11, image 75). Mild localized vasogenic edema. Additional small metastasis seen within the peripheral left temporal lobe measures 5 mm (series 11, image 30).  No midline shift. No extra-axial fluid collection. No intracranial hemorrhage.  Pituitary gland within normal limits. No acute abnormality seen about either orbit.  Bone marrow signal intensity demonstrates a normal appearance  IMPRESSION: 1. 2.9 x 2.6 x 2.8 cm cystic metastasis within the central cerebellum with associated edema and mass effect on the adjacent fourth ventricle. 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 No frank transependymal flow of CSF at this time. 2. Multiple additional  intracranial metastases involving the left cerebellar hemisphere, left frontal lobe, left parietal lobe, and left temporal lobe. 3. Remote basal ganglia and left subinsular infarcts. Critical Value/emergent results were called by telephone at the time of interpretation on 01/18/2014 at 10:18 pm to Dr. Rolland Porter , who verbally acknowledged these results.   Electronically Signed   By: Jeannine Boga M.D.   On: 01/18/2014 22:28   Ct Abdomen Pelvis W Contrast  01/18/2014   CLINICAL DATA:  Nausea, vomiting and generalized weakness with upper abdominal pain for 2 days. History of breast cancer.  EXAM: CT ABDOMEN AND PELVIS WITH CONTRAST  TECHNIQUE: Multidetector CT imaging of the abdomen and pelvis was performed using the standard protocol following bolus administration of intravenous contrast.  CONTRAST:  18m OMNIPAQUE IOHEXOL 300 MG/ML  SOLN  COMPARISON:  PET-CT 01/08/2014.  Abdominal pelvic CT 12/05/2012.  FINDINGS: Lung bases: Multiple metastases are present in both lung bases, similar to the recent PET-CT. A central right middle lobe component is incompletely visualized, but grossly stable. There is a stable peripherally enhancing mass within the right anterior chest wall, measuring 2.9 cm on image 8. There is no significant pleural effusion. A small pericardial effusion is present.  Liver/Biliary/Pancreas: There are numerous ill-defined low-density hepatic lesions which are worrisome for metastatic disease. The largest lesions are within the lateral segment of the left hepatic lobe (1.9 cm on image 16) and medially in the posterior segment of the right hepatic lobe (2.2 cm on image 16). There are multiple smaller lesions. No evidence of gallstones, gallbladder wall thickening or biliary dilatation. There is enlargement of the pancreatic tail with a 1.6 cm low-density lesion on image number 25.  Spleen/Adrenal glands: Unremarkable.  Kidneys/Ureters/Bladder: Both kidneys demonstrate cortical lobularity and  small cysts. There is no hydronephrosis or evidence of urinary tract calculus. The bladder appears unremarkable.  Bowel/Peritoneum: The stomach, small bowel, appendix and colon demonstrate no significant findings. There is no bowel wall thickening or surrounding inflammatory  change. Moderate stool is present throughout the colon. There is no ascites.  Retroperitoneum/Pelvis: There are no enlarged abdominal or pelvic lymph nodes. There is diffuse atherosclerosis of the aorta and iliac arteries. The uterus and ovaries appear unchanged.  Abdominal wall: No abdominal wall masses or hernias.  Musculoskeletal: Osseous metastases are present, including a large lytic lesion within the left aspect of the L4 vertebral body. No significant pathologic fracture or epidural tumor identified.  IMPRESSION: 1. Widespread metastatic disease to the lung bases, liver and bones. There is a possible metastasis within the pancreatic tail. 2. No definite acute findings. 3. Grossly stable renal cortical lobularity and cysts. 4. Grossly stable atherosclerosis.   Electronically Signed   By: Camie Patience M.D.   On: 01/18/2014 21:26    Medications:  Prior to Admission:  Prescriptions prior to admission  Medication Sig Dispense Refill  . aspirin 81 MG chewable tablet Chew 1 tablet (81 mg total) by mouth daily.      Marland Kitchen atorvastatin (LIPITOR) 40 MG tablet Take 40 mg by mouth at bedtime.      . digoxin (LANOXIN) 0.125 MG tablet Take 0.5 tablets (0.0625 mg total) by mouth daily.  15 tablet  6  . docusate sodium (COLACE) 100 MG capsule Take 100 mg by mouth at bedtime.      . fluPHENAZine decanoate (PROLIXIN) 25 MG/ML injection Inject 25 mg into the muscle every 14 (fourteen) days.      . furosemide (LASIX) 40 MG tablet Take 1 tablet (40 mg total) by mouth daily.  30 tablet    . glipiZIDE (GLUCOTROL) 5 MG tablet Take 5 mg by mouth 2 (two) times daily before a meal.      . insulin detemir (LEVEMIR) 100 UNIT/ML injection Inject 40 Units  into the skin daily.       Marland Kitchen lisinopril (PRINIVIL,ZESTRIL) 2.5 MG tablet Take 1.25 mg by mouth 2 (two) times daily. Take 1/2 tab in AM and 1/2 tab in PM      . loratadine (CLARITIN) 10 MG tablet Take 10 mg by mouth daily.      Marland Kitchen loxapine (LOXITANE) 10 MG capsule Take 10 mg by mouth at bedtime.      . magnesium oxide (MAG-OX) 400 MG tablet Take 400 mg by mouth daily.      . nortriptyline (PAMELOR) 50 MG capsule Take 50 mg by mouth at bedtime.       Marland Kitchen omeprazole (PRILOSEC) 20 MG capsule Take 20 mg by mouth daily.      . polyvinyl alcohol (LIQUIFILM TEARS) 1.4 % ophthalmic solution Place 1 drop into both eyes 2 (two) times daily as needed.  15 mL  0  . potassium chloride SA (K-DUR,KLOR-CON) 20 MEQ tablet Take 1 tablet (20 mEq total) by mouth daily.  30 tablet  6  . Propylene Glycol (SYSTANE BALANCE) 0.6 % SOLN Apply 1 drop to eye 2 (two) times daily as needed (dry eyes).      Marland Kitchen spironolactone (ALDACTONE) 25 MG tablet Take 1 tablet (25 mg total) by mouth daily.  30 tablet  6  . temazepam (RESTORIL) 15 MG capsule Take 15 mg by mouth at bedtime.      . traMADol (ULTRAM) 50 MG tablet Take 1 tablet (50 mg total) by mouth every 6 (six) hours as needed for moderate pain.  30 tablet  5  . traZODone (DESYREL) 150 MG tablet Take 150 mg by mouth at bedtime.      Marland Kitchen denosumab (PROLIA) 60  MG/ML SOLN injection Inject 60 mg into the skin every 6 (six) months. Administer in upper arm, thigh, or abdomen      . heparin lock flush 100 UNIT/ML SOLN 5 mLs (500 Units total) by Intracatheter route every 30 (thirty) days.      . ramelteon (ROZEREM) 8 MG tablet Take 8 mg by mouth at bedtime as needed for sleep.        Scheduled: . aspirin  81 mg Oral Daily  . atorvastatin  40 mg Oral QHS  . dexamethasone  4 mg Oral 4 times per day  . digoxin  0.0625 mg Oral Daily  . docusate sodium  100 mg Oral BID  . feeding supplement (ENSURE COMPLETE)  237 mL Oral BID BM  . fluPHENAZine decanoate  25 mg Intramuscular Q14 Days  .  furosemide  40 mg Oral Daily  . Influenza vac split quadrivalent PF  0.5 mL Intramuscular Tomorrow-1000  . insulin aspart  0-20 Units Subcutaneous TID WC  . insulin aspart  0-5 Units Subcutaneous QHS  . insulin detemir  20 Units Subcutaneous BID  . loxapine  10 mg Oral QHS  . nortriptyline  50 mg Oral QHS  . spironolactone  25 mg Oral Daily  . temazepam  15 mg Oral QHS   Continuous:  UCJ:[id number], acetaminophen, morphine injection, ondansetron (ZOFRAN) IV, ondansetron, oxyCODONE  Assesment: She has breast cancer with widespread metastatic disease. She has protein calorie malnutrition. The plan is for hospice care but I need to get her pain controlled before she goes Principal Problem:   Breast cancer metastasized to brain Active Problems:   GERD (gastroesophageal reflux disease)   Schizophrenia   Breast cancer, right breast   Congestive dilated cardiomyopathy   Brain metastases   Protein-calorie malnutrition, severe    Plan: Switch her to oral morphine. Switch her to oral Decadron. Continue other treatments.    LOS: 2 days   Evone Arseneau L 01/20/2014, 9:02 AM

## 2014-01-20 NOTE — Progress Notes (Signed)
Initial visit. A sister was also present. We discussed family and faith issues, brief life history recall. Addressed support and recent death of a close brother. Prayed with her for peace and comfort.

## 2014-01-20 NOTE — Progress Notes (Signed)
Inpatient Diabetes Program Recommendations  AACE/ADA: New Consensus Statement on Inpatient Glycemic Control (2013)  Target Ranges:  Prepandial:   less than 140 mg/dL      Peak postprandial:   less than 180 mg/dL (1-2 hours)      Critically ill patients:  140 - 180 mg/dL   Results for [name], [name] (MRN [id number]) as of 01/20/2014 09:52  Ref. Range 01/19/2014 08:20 01/19/2014 11:25 01/19/2014 16:34 01/19/2014 17:59 01/19/2014 21:52 01/20/2014 07:24  Glucose-Capillary Latest Range: 70-99 mg/dL 443 (H) 341 (H) 584 (HH) 508 (H) 201 (H) 297 (H)   Diabetes history: DM Outpatient Diabetes medications: Levemir 40 units QHS, Glipizide 5 mg BID Current orders for Inpatient glycemic control: Levemir 20 units BID, Novolog 0-20 units AC, Nvoolog 0-5 units HS  Inpatient Diabetes Program Recommendations Insulin - Basal: If Decadron will be continued, please increase Levemir to 25 units BID. Insulin - Meal Coverage: If Decadron will be continued, please consider ordering Novolog meal coverage.  Thanks, Barnie Alderman, RN, MSN, CCRN Diabetes Coordinator Inpatient Diabetes Program [phone] (Team Pager) [phone] ([facility] office) [phone] [facility] office)

## 2014-01-21 LAB — GLUCOSE, CAPILLARY
Glucose-Capillary: 237 mg/dL — ABNORMAL HIGH (ref 70–99)
Glucose-Capillary: 229 mg/dL — ABNORMAL HIGH (ref 70–99)
Glucose-Capillary: 392 mg/dL — ABNORMAL HIGH (ref 70–99)

## 2014-01-21 MED ORDER — DEXAMETHASONE 2 MG PO TABS: 2.0000 mg | ORAL_TABLET | Freq: Four times a day (QID) | ORAL | Status: AC

## 2014-01-21 MED ORDER — INSULIN DETEMIR 100 UNIT/ML ~~LOC~~ SOLN: 50.0000 [IU] | Freq: Every day | SUBCUTANEOUS | Status: AC

## 2014-01-21 MED ORDER — MORPHINE SULFATE 15 MG PO TABS: 15.0000 mg | ORAL_TABLET | ORAL | Status: AC | PRN

## 2014-01-21 NOTE — Progress Notes (Signed)
She says she feels better. Her pain is better controlled. She's had some trouble with her blood sugar but I don't think she needs to stay in the hospital for that. I will plan to send her home with hospice today

## 2014-01-21 NOTE — Progress Notes (Signed)
Nutrition Brief Note  Chart reviewed. Pt now transitioning to comfort care.  No further nutrition interventions warranted at this time.  Please re-consult as needed.   Ashawn Rinehart A. Cellie Dardis, RD, LDN Pager: [phone]  

## 2014-01-21 NOTE — Discharge Summary (Signed)
Physician Discharge Summary  Patient ID: [name] MRN: [id number] DOB/AGE: 11/02/1950 63 y.o. Primary Care Physician:Shreyan Hinz L, MD Admit date: 01/18/2014 Discharge date: 01/21/2014    Discharge Diagnoses:   Principal Problem:   Breast cancer metastasized to brain Active Problems:   GERD (gastroesophageal reflux disease)   Schizophrenia   Breast cancer, right breast   Congestive dilated cardiomyopathy   CAD (coronary artery disease)   Chronic systolic CHF (congestive heart failure)   Brain metastases   Protein-calorie malnutrition, severe     Medication List    STOP taking these medications       denosumab 60 MG/ML Soln injection  Commonly known as:  PROLIA     heparin lock flush 100 UNIT/ML Soln injection     SYSTANE BALANCE 0.6 % Soln  Generic drug:  Propylene Glycol     traMADol 50 MG tablet  Commonly known as:  ULTRAM      TAKE these medications       aspirin 81 MG chewable tablet  Chew 1 tablet (81 mg total) by mouth daily.     atorvastatin 40 MG tablet  Commonly known as:  LIPITOR  Take 40 mg by mouth at bedtime.     dexamethasone 2 MG tablet  Commonly known as:  DECADRON  Take 1 tablet (2 mg total) by mouth every 6 (six) hours.     digoxin 0.125 MG tablet  Commonly known as:  LANOXIN  Take 0.5 tablets (0.0625 mg total) by mouth daily.     docusate sodium 100 MG capsule  Commonly known as:  COLACE  Take 100 mg by mouth at bedtime.     fluPHENAZine decanoate 25 MG/ML injection  Commonly known as:  PROLIXIN  Inject 25 mg into the muscle every 14 (fourteen) days.     furosemide 40 MG tablet  Commonly known as:  LASIX  Take 1 tablet (40 mg total) by mouth daily.     glipiZIDE 5 MG tablet  Commonly known as:  GLUCOTROL  Take 5 mg by mouth 2 (two) times daily before a meal.     insulin detemir 100 UNIT/ML injection  Commonly known as:  LEVEMIR  Inject 0.5 mLs (50 Units total) into the skin daily.     lisinopril 2.5 MG tablet   Commonly known as:  PRINIVIL,ZESTRIL  Take 1.25 mg by mouth 2 (two) times daily. Take 1/2 tab in AM and 1/2 tab in PM     loratadine 10 MG tablet  Commonly known as:  CLARITIN  Take 10 mg by mouth daily.     loxapine 10 MG capsule  Commonly known as:  LOXITANE  Take 10 mg by mouth at bedtime.     magnesium oxide 400 MG tablet  Commonly known as:  MAG-OX  Take 400 mg by mouth daily.     morphine 15 MG tablet  Commonly known as:  MSIR  Take 1 tablet (15 mg total) by mouth every 2 (two) hours as needed for severe pain.     nortriptyline 50 MG capsule  Commonly known as:  PAMELOR  Take 50 mg by mouth at bedtime.     omeprazole 20 MG capsule  Commonly known as:  PRILOSEC  Take 20 mg by mouth daily.     polyvinyl alcohol 1.4 % ophthalmic solution  Commonly known as:  LIQUIFILM TEARS  Place 1 drop into both eyes 2 (two) times daily as needed.     potassium chloride SA 20 MEQ tablet  Commonly known as:  K-DUR,KLOR-CON  Take 1 tablet (20 mEq total) by mouth daily.     ramelteon 8 MG tablet  Commonly known as:  ROZEREM  Take 8 mg by mouth at bedtime as needed for sleep.     spironolactone 25 MG tablet  Commonly known as:  ALDACTONE  Take 1 tablet (25 mg total) by mouth daily.     temazepam 15 MG capsule  Commonly known as:  RESTORIL  Take 15 mg by mouth at bedtime.     traZODone 150 MG tablet  Commonly known as:  DESYREL  Take 150 mg by mouth at bedtime.        Discharged Condition: Improved    Consults: Oncology  Significant Diagnostic Studies: Dg Chest 2 View  12/26/2013   CLINICAL DATA:  Chest and flank pain.  EXAM: CHEST  2 VIEW  COMPARISON:  11/01/2012.  FINDINGS: The power port is stable. The cardiac silhouette, mediastinal and hilar contours are within normal limits and stable. There is tortuosity, calcification an ectasia of the thoracic aorta. The lungs are hyperinflated. Pulmonary nodules are again noted consistent with known metastatic disease.  Bibasilar atelectasis but no infiltrates or effusions. The bony thorax is intact.  IMPRESSION: Small bilateral pulmonary nodules consistent with metastatic disease.  Chronic lung changes but no definite acute pulmonary infiltrates.   Electronically Signed   By: Kalman Jewels M.D.   On: 12/26/2013 01:11   Mr Jeri Cos XT Contrast  01/18/2014   CLINICAL DATA:  Initial evaluation for altered mental status today, weakness, inability to walk. History of breast cancer.  EXAM: MRI HEAD WITHOUT AND WITH CONTRAST  TECHNIQUE: Multiplanar, multiecho pulse sequences of the brain and surrounding structures were obtained without and with intravenous contrast.  CONTRAST:  97mL MULTIHANCE GADOBENATE DIMEGLUMINE 529 MG/ML IV SOLN  COMPARISON:  Most recent MRI from 08/10/2009  FINDINGS: Mild diffuse prominence of the CSF containing spaces is compatible with generalized cerebral atrophy. Mild patchy and confluent T2/FLAIR hyperintensity within the periventricular and deep white matter of the bilateral cerebral hemispheres, likely related to remote ischemia. Small remote lacunar infarcts seen within the bilateral basal ganglia, left greater than right, as well as the left subinsular white matter.  No abnormal foci of restricted diffusion are seen to suggest acute intracranial infarct. Gray-white matter differentiation is maintained. Normal flow voids seen within the intracranial vasculature.  An irregular multi cystic T2 hyperintense cystic mass is seen within the central cerebellum, primarily involving the median left cerebellar hemisphere, but crossing midline into the medial right cerebellar hemisphere as well. This lesion measured 2.9 (AP) x 2.6 (transverse) x 2.8 (craniocaudad) cm and demonstrates peripheral rim enhancement (series 11, image 33). Minimal blood breakdown products seen within this lesion. Smaller satellite lesion measuring 1.0 x 0.8 x 9.0 cm present within the left cerebellar hemisphere (series 10, image 37).  There is a third lesion within the left cerebellar hemisphere measuring 1.2 x 0.9 x 1.0 cm. 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 No definite transependymal flow of CSF at this time. The cerebellar tonsils remain normally located above the foramen magnum without inferior herniation.  There are are 2 additional lesions within the high left frontal and parietal lobes measuring 0.8 x 0.6 x 0.9 cm and 0.6 x 0.8 x 0.7 cm respectively (series 11, image 75). Mild  localized vasogenic edema. Additional small metastasis seen within the peripheral left temporal lobe measures 5 mm (series 11, image 30).  No midline shift. No extra-axial fluid collection. No intracranial hemorrhage.  Pituitary gland within normal limits. No acute abnormality seen about either orbit.  Bone marrow signal intensity demonstrates a normal appearance  IMPRESSION: 1. 2.9 x 2.6 x 2.8 cm cystic metastasis within the central cerebellum with associated edema and mass effect on the adjacent fourth ventricle. 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 No frank transependymal flow of CSF at this time. 2. Multiple additional intracranial metastases involving the left cerebellar hemisphere, left frontal lobe, left parietal lobe, and left temporal lobe. 3. Remote basal ganglia and left subinsular infarcts. Critical Value/emergent results were called by telephone at the time of interpretation on 01/18/2014 at 10:18 pm to Dr. Rolland Porter , who verbally acknowledged these results.   Electronically Signed   By: Jeannine Boga M.D.   On: 01/18/2014 22:28   Ct Abdomen Pelvis W Contrast  01/18/2014   CLINICAL DATA:  Nausea, vomiting and generalized  weakness with upper abdominal pain for 2 days. History of breast cancer.  EXAM: CT ABDOMEN AND PELVIS WITH CONTRAST  TECHNIQUE: Multidetector CT imaging of the abdomen and pelvis was performed using the standard protocol following bolus administration of intravenous contrast.  CONTRAST:  37mL OMNIPAQUE IOHEXOL 300 MG/ML  SOLN  COMPARISON:  PET-CT 01/08/2014.  Abdominal pelvic CT 12/05/2012.  FINDINGS: Lung bases: Multiple metastases are present in both lung bases, similar to the recent PET-CT. A central right middle lobe component is incompletely visualized, but grossly stable. There is a stable peripherally enhancing mass within the right anterior chest wall, measuring 2.9 cm on image 8. There is no significant pleural effusion. A small pericardial effusion is present.  Liver/Biliary/Pancreas: There are numerous ill-defined low-density hepatic lesions which are worrisome for metastatic disease. The largest lesions are within the lateral segment of the left hepatic lobe (1.9 cm on image 16) and medially in the posterior segment of the right hepatic lobe (2.2 cm on image 16). There are multiple smaller lesions. No evidence of gallstones, gallbladder wall thickening or biliary dilatation. There is enlargement of the pancreatic tail with a 1.6 cm low-density lesion on image number 25.  Spleen/Adrenal glands: Unremarkable.  Kidneys/Ureters/Bladder: Both kidneys demonstrate cortical lobularity and small cysts. There is no hydronephrosis or evidence of urinary tract calculus. The bladder appears unremarkable.  Bowel/Peritoneum: The stomach, small bowel, appendix and colon demonstrate no significant findings. There is no bowel wall thickening or surrounding inflammatory change. Moderate stool is present throughout the colon. There is no ascites.  Retroperitoneum/Pelvis: There are no enlarged abdominal or pelvic lymph nodes. There is diffuse atherosclerosis of the aorta and iliac arteries. The uterus and ovaries appear  unchanged.  Abdominal wall: No abdominal wall masses or hernias.  Musculoskeletal: Osseous metastases are present, including a large lytic lesion within the left aspect of the L4 vertebral body. No significant pathologic fracture or epidural tumor identified.  IMPRESSION: 1. Widespread metastatic disease to the lung bases, liver and bones. There is a possible metastasis within the pancreatic tail. 2. No definite acute findings. 3. Grossly stable renal cortical lobularity and cysts. 4. Grossly stable atherosclerosis.   Electronically Signed   By: Camie Patience M.D.   On: 01/18/2014 21:26   Nm Pulmonary Perf And Vent  12/26/2013   CLINICAL DATA:  Left-sided chest pain  EXAM: NUCLEAR MEDICINE VENTILATION - PERFUSION LUNG SCAN  TECHNIQUE: Ventilation images were obtained in multiple projections using inhaled aerosol technetium 99 M DTPA. Perfusion images were obtained in multiple projections after intravenous injection of Tc-45m MAA.  RADIOPHARMACEUTICALS:  Forty-four mCi Tc-48m DTPA aerosol and 6 mCi Tc-10m MAA  COMPARISON:  Chest radiograph 12/26/2013  FINDINGS: Ventilation: No focal ventilation defect.  Perfusion: No wedge shaped peripheral perfusion defects to suggest acute pulmonary embolism.  IMPRESSION: No evidence of pulmonary embolism.   Electronically Signed   By: Skipper Cliche M.D.   On: 12/26/2013 12:54   Nm Pet Image Restag (ps) Skull Base To Thigh  01/08/2014   CLINICAL DATA:  Subsequent treatment strategy for breast carcinoma.  EXAM: NUCLEAR MEDICINE PET SKULL BASE TO THIGH  TECHNIQUE: 6.2 mCi F-18 FDG was injected intravenously. Full-ring PET imaging was performed from the skull base to thigh after the radiotracer. CT data was obtained and used for attenuation correction and anatomic localization.  FASTING BLOOD GLUCOSE:  Value: 225 mg/dl  COMPARISON:  PET-CT 03/27/2012, CT 09/08/2013  FINDINGS: NECK  No hypermetabolic lymph nodes in the neck.  CHEST  There is a hypermetabolic mass in the medial  right chest wall measuring 3.7 x 2.5 cm with intense metabolic activity (SUV max 6.0). This is site of prior focus of malignancy on PET-CT of 03/27/2012.  There are bilateral pulmonary sub cm nodules which are new from PET-CT of 03/27/2012 consistent with pulmonary metastasis. Several these nodules do have a mild metabolic activity. There is consolidative focus in the right middle lobe measuring 27 x 18 mm with SUV max 5.5.  Example pulmonary nodules occluded 6 mm nodule in the left lower lobe on image 35 and 7 mm nodule right lower lobe on image 42. 7 mm nodule in the left upper lobe. There are 6 to 8 nodules for lung.  ABDOMEN/PELVIS  No abnormal hypermetabolic activity within the liver, pancreas, adrenal glands, or spleen. No hypermetabolic lymph nodes in the abdomen or pelvis.  SKELETON  There are new skeletal metastasis. There are lytic lesion and sclerotic leisons with associated intense metabolic activity. For example lesion at L4 with SUV max 6.4 correlates with a large lytic lesion measuring 2.5 cm (image 139). There are metastatic lesions at L3 and L2. Metastatic lesions in the left acetabulum with SUV max of 5.9. Left scapular lesions and rib lesions notes.  IMPRESSION: 1. Progression of breast cancer metastasis with new pulmonary metastasis and skeletal metastasis. 2. Hypermetabolic mass in the medial right breast at site of prior malignancy. 3. Multiple bilateral small pulmonary nodules are new from prior. Focus of hypermetabolic consolidation in the right middle lobe. 4. Widespread skeletal metastasis with hypermetabolic lytic and sclerotic lesions in the pelvis, spine, ribs, and left scapula.   Electronically Signed   By: Suzy Bouchard M.D.   On: 01/08/2014 11:28   US Venous Img Lower Bilateral  12/26/2013   CLINICAL DATA:  Chest pain  EXAM: BILATERAL LOWER EXTREMITY VENOUS DOPPLER ULTRASOUND  TECHNIQUE: Gray-scale sonography with graded compression, as well as color Doppler and duplex  ultrasound were performed to evaluate the lower extremity deep venous systems from the level of the common femoral vein and including the common femoral, femoral, profunda femoral, popliteal and calf veins including the posterior tibial, peroneal and gastrocnemius veins when visible. The superficial great saphenous vein was also interrogated. Spectral Doppler was utilized to evaluate flow at rest and with distal augmentation maneuvers in the common femoral, femoral and popliteal  veins.  COMPARISON:  None.  FINDINGS: RIGHT LOWER EXTREMITY  Common Femoral Vein: No evidence of thrombus. Normal compressibility, respiratory phasicity and response to augmentation.  Saphenofemoral Junction: No evidence of thrombus. Normal compressibility and flow on color Doppler imaging.  Profunda Femoral Vein: No evidence of thrombus. Normal compressibility and flow on color Doppler imaging.  Femoral Vein: No evidence of thrombus. Normal compressibility, respiratory phasicity and response to augmentation.  Popliteal Vein: No evidence of thrombus. Normal compressibility, respiratory phasicity and response to augmentation.  Calf Veins: No evidence of thrombus.  LEFT LOWER EXTREMITY  Common Femoral Vein: No evidence of thrombus. Normal compressibility, respiratory phasicity and response to augmentation.  Saphenofemoral Junction: No evidence of thrombus. Normal compressibility and flow on color Doppler imaging.  Profunda Femoral Vein: No evidence of thrombus. Normal compressibility and flow on color Doppler imaging.  Femoral Vein: No evidence of thrombus. Normal compressibility, respiratory phasicity and response to augmentation.  Popliteal Vein: No evidence of thrombus. Normal compressibility, respiratory phasicity and response to augmentation.  Calf Veins: No evidence of thrombus.  IMPRESSION: No evidence of deep venous thrombosis.   Electronically Signed   By: Skipper Cliche M.D.   On: 12/26/2013 10:58    Lab Results: Basic Metabolic  Panel:  Recent Labs  01/18/14 1513 01/19/14 0505 01/19/14 0839  NA 134* 131*  --   K 4.6 5.0  --   CL 90* 91*  --   CO2 29 26  --   GLUCOSE 403* 387* 432*  BUN 25* 24*  --   CREATININE 1.57* 1.38*  --   CALCIUM 10.2 9.7  --    Liver Function Tests:  Recent Labs  01/18/14 1513 01/19/14 0505  AST 34 28  ALT 35 32  ALKPHOS 199* 191*  BILITOT 0.3 0.3  PROT 8.4* 8.1  ALBUMIN 4.0 3.8     CBC:  Recent Labs  01/18/14 1513 01/19/14 0505  WBC 8.8 9.0  NEUTROABS 7.0  --   HGB 13.2 12.6  HCT 38.2 36.4  MCV 92.5 91.5  PLT 310 309    No results found for this or any previous visit (from the past 240 hour(s)).   Hospital Course: This is a 63 year old who has breast cancer that is known to be metastatic. She had PET scanning recently. She was brought to my office with complaints that she couldn't move her left side and she had severe pain in her right chest. I asked her family to bring her to the emergency department which they did and on evaluation in the emergency department she was found to have metastatic disease to her brain. She is also noted to have widespread metastatic disease to bone. She was started on Decadron and her neurological symptoms improved. Oncology consultation was obtained and I discussed her situation with Dr.Njeistrom who is her treating oncologist. After discussion with family the patient and the oncology consultants it was felt that she was most appropriate for hospice care without any further palliative irradiation. She had trouble with pain so her pain medications were changed but she's tolerating immediate release morphine well. She does not want to try morphine concentrate at this point because she doesn't like the taste. She had trouble with her blood sugar but that's improved.  Discharge Exam: Blood pressure 125/70, pulse 110, temperature 98.1 F (36.7 C), temperature source Oral, resp. rate 18, height 5\' 5"  (1.651 m), weight 55.203 kg (121 lb 11.2  oz), SpO2 96.00%. She's awake and alert. Her chest is clear.  Disposition: Home with hospice services      Discharge Instructions   Discharge patient    Complete by:  As directed              Signed: Tericka Devincenzi L   01/21/2014, 9:05 AM

## 2014-01-21 NOTE — Clinical Social Work Note (Signed)
CSW arranged transport via [facility] at pt's request. Verified address and that family would be present upon arrival. Out of facility DNR sent with pt.  Benay Pike, [place]

## 2014-01-29 ENCOUNTER — Other Ambulatory Visit: Payer: Self-pay

## 2014-03-25 ENCOUNTER — Encounter (HOSPITAL_COMMUNITY): Payer: Self-pay | Admitting: Cardiology

## 2014-04-16 DEATH — deceased

## 2014-10-11 ENCOUNTER — Other Ambulatory Visit: Payer: Self-pay

## 2014-12-28 IMAGING — CT CT ANGIO CHEST
1 of 6 series · 5 of 36 positions shown · IV contrast (Omnipaque 300)
Comparison: 03/27/2012 PET CT.

CLINICAL DATA: Shortness of breath..  Cough.  History breast
cancer.  Ongoing chemotherapy.

CT ANGIOGRAPHY CHEST
TECHNIQUE: Multidetector CT imaging of the chest using the
standard protocol during bolus administration of intravenous
contrast. Multiplanar reconstructed images including MIPs were
obtained and reviewed to evaluate the vascular anatomy.
Contrast: 100mL OMNIPAQUE IOHEXOL 350 MG/ML SOLN

[Series 4: pe 3.0 b40f · axial · 0.59mm/px · z∈[-172,-16]mm · 5 of 78 slices shown]
[im 13/78  lung]
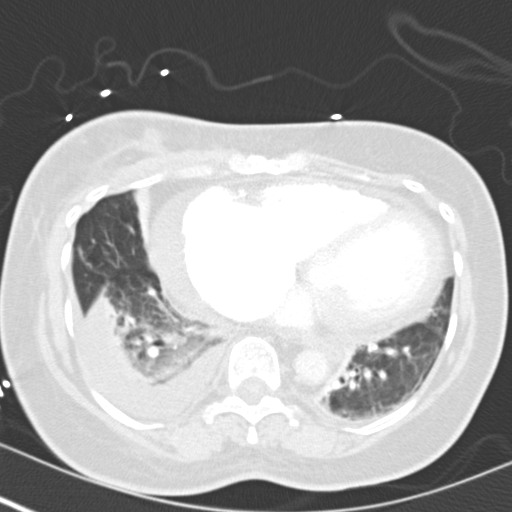
[im 26/78  mediastinal]
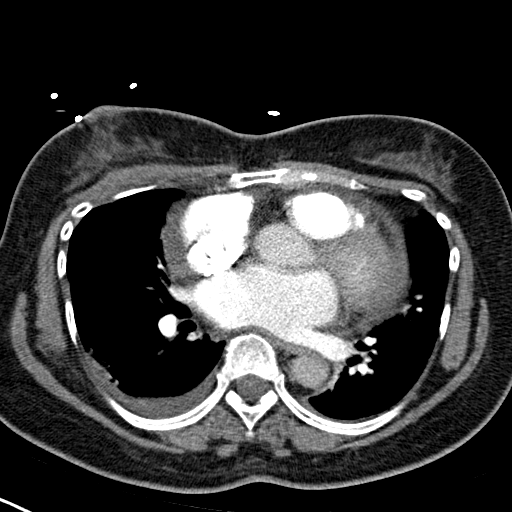
[im 39/78  lung]
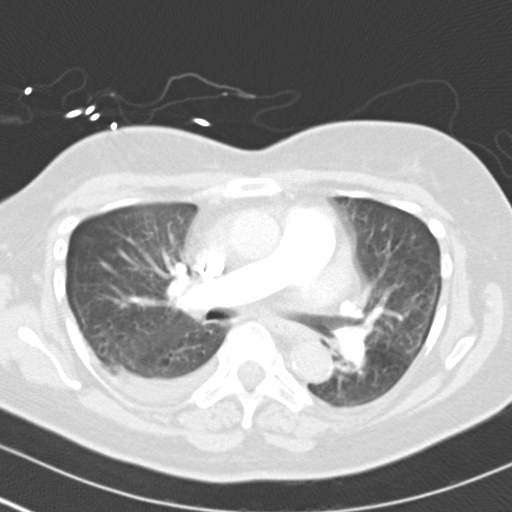
[im 52/78  mediastinal]
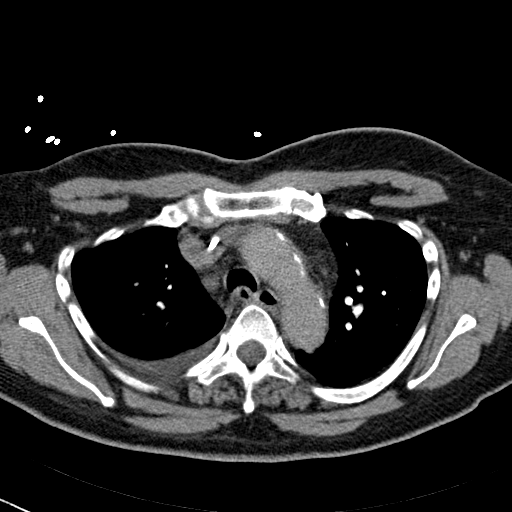
[im 65/78  lung]
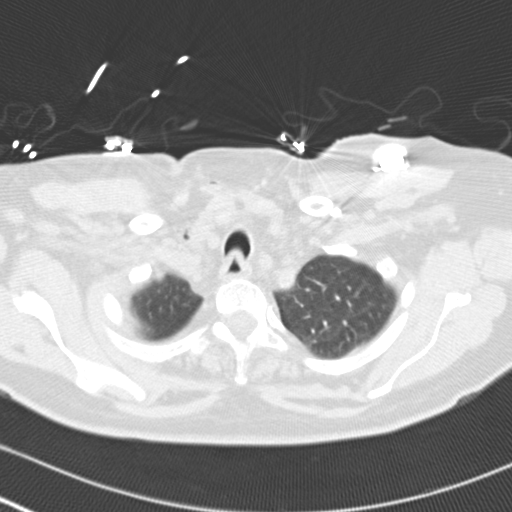

[5 of 36 positions shown; findings below may reference images not displayed]

FINDINGS: Respiratory motion degraded examination without medium or
large size pulmonary embolus.  Evaluation of peripheral branch is
limited by the motion.

Cardiomegaly.  Small to slightly moderate sized pericardial
effusion.  Prominent coronary artery calcifications.

Small to slightly moderate size right-sided pleural effusion with
basilar atelectasis.  Minimal left-sided pleural effusion with
basilar atelectasis.  No obvious pulmonary nodule although
evaluation limited by motion.

Atherosclerotic type changes of the aorta.  No aneurysmal dilation.
No obvious dissection although evaluation not performed to evaluate
the aorta.

Right breast mass.  No axillary, hilar or mediastinal adenopathy.

No bony destructive lesion.

Limited imaging of the upper abdomen reveals prominent hepatic
veins which may indicate increased right heart pressure.  Adrenal
glands not imaged.
IMPRESSION: Respiratory motion degraded examination without medium or large
size pulmonary embolus.  Evaluation of peripheral branch is limited
by the motion.

Cardiomegaly.  Small to slightly moderate sized pericardial
effusion.  Prominent coronary artery calcifications.

Small to slightly moderate size right-sided pleural effusion with
basilar atelectasis.  Minimal left-sided pleural effusion with
basilar atelectasis. Question mild congestive heart failure.

Right breast mass.  No axillary, hilar or mediastinal adenopathy.

No bony destructive lesion.

Limited imaging of the upper abdomen reveals prominent hepatic
veins which may indicate increased right heart pressure.

## 2015-10-04 IMAGING — US US BREAST LTD UNI RIGHT INC AXILLA
1 series · 9 of 9 positions shown · non-contrast
Comparison: 05/06/2013, 03/04/2012, 02/12/2011.

CLINICAL DATA: History of right breast invasive mammary carcinoma
diagnosed in February 2012. Patient was felt not to be an operative
candidate and was treated with chemotherapy and radiation therapy.
Followup evaluation.

EXAM:
DIGITAL DIAGNOSTIC  right breast MAMMOGRAM WITH CAD
ULTRASOUND right BREAST

[Series 1: us breast ltd uni right inc axilla · 0.06mm/px · 9 of 9 slices shown]
[im 1/9]
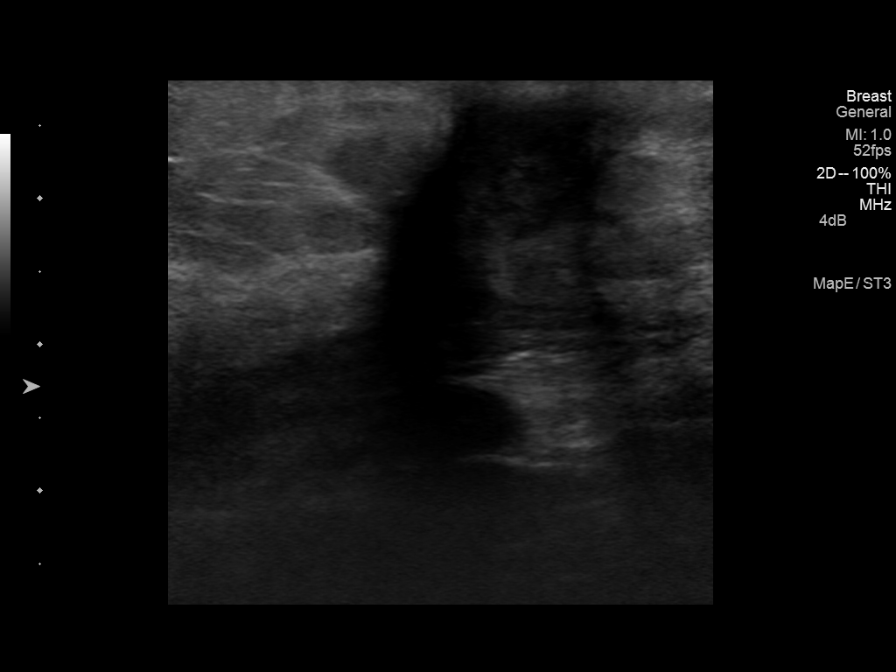
[im 2/9]
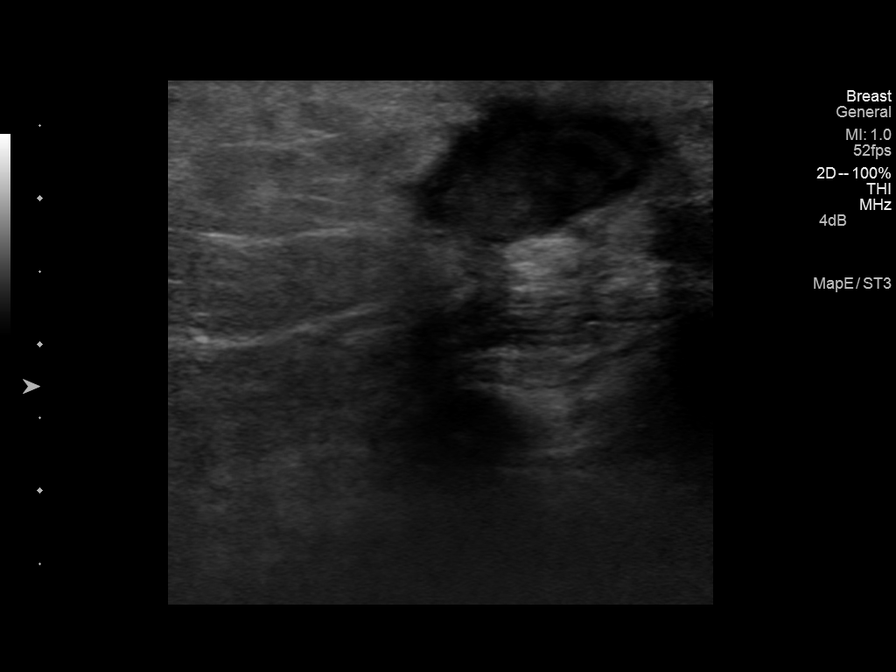
[im 3/9]
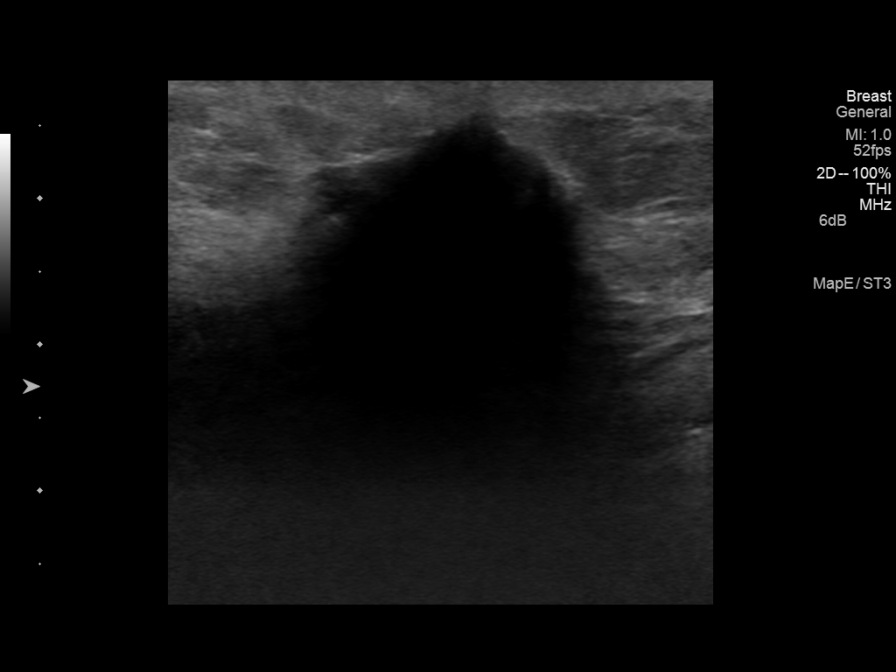
[im 4/9]
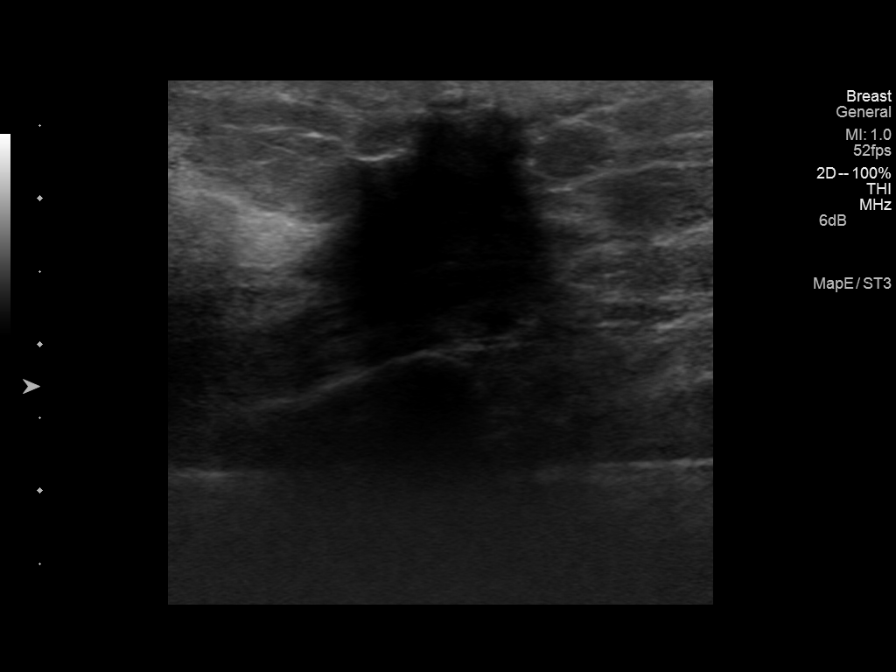
[im 5/9]
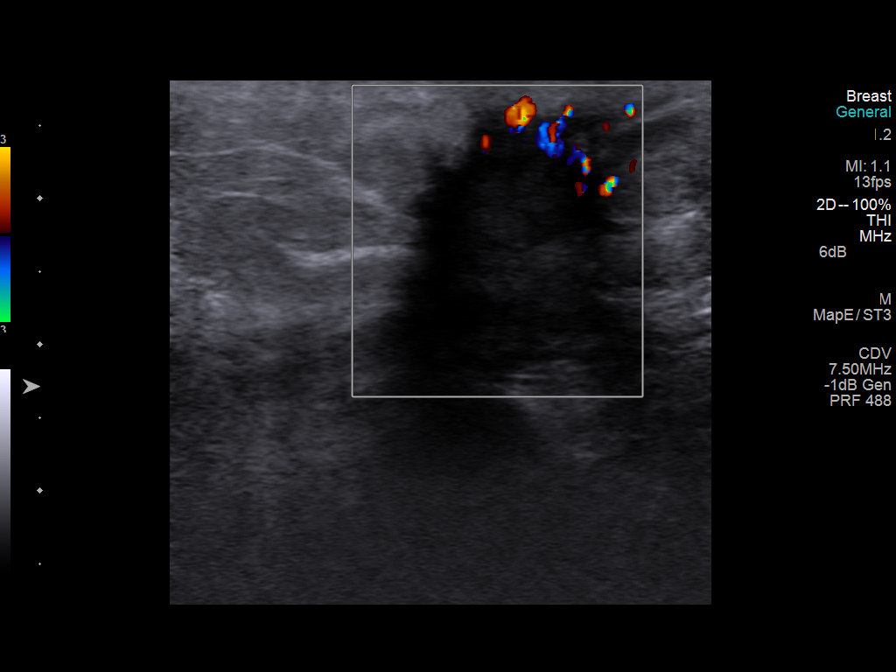
[im 6/9]
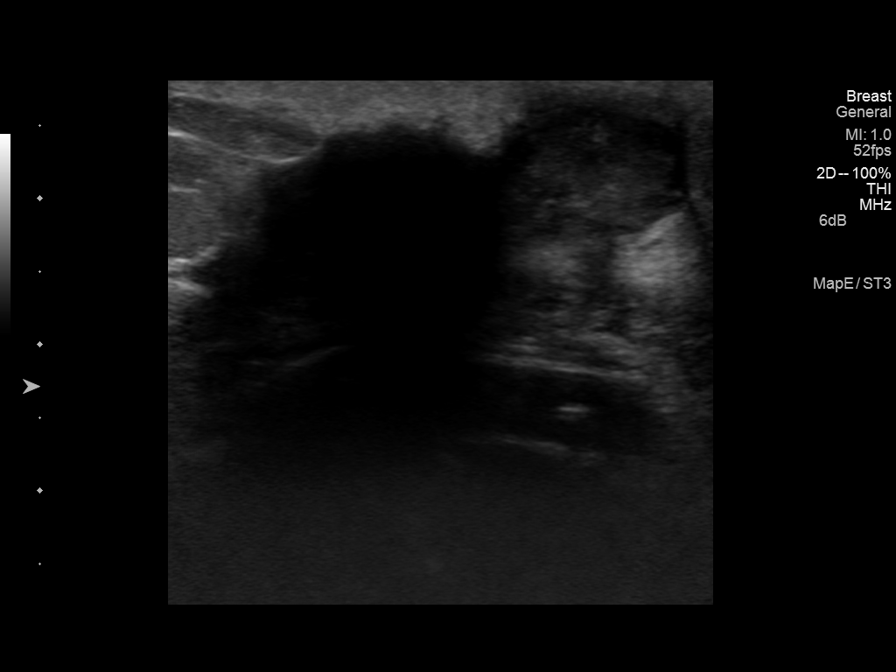
[im 7/9]
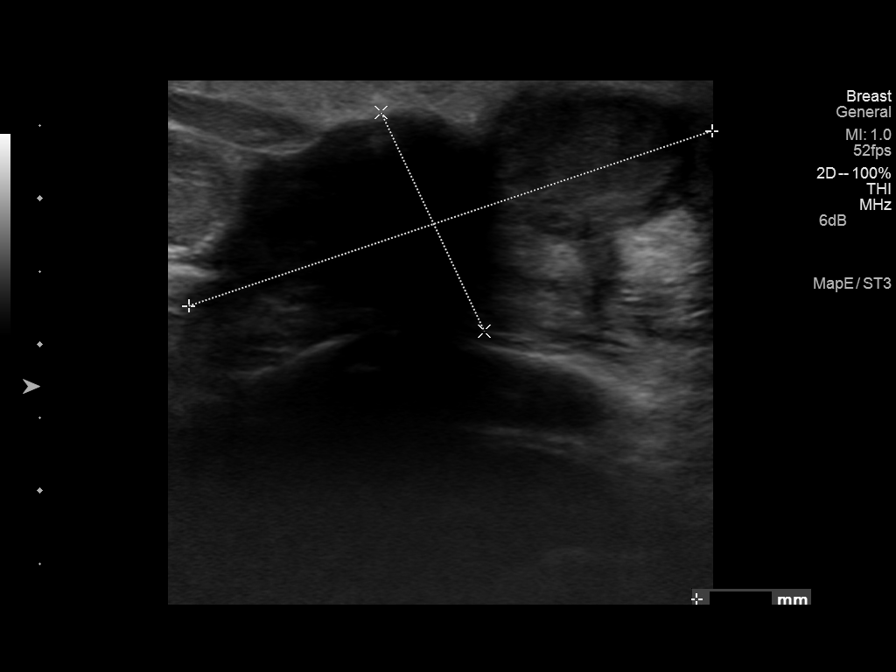
[im 8/9]
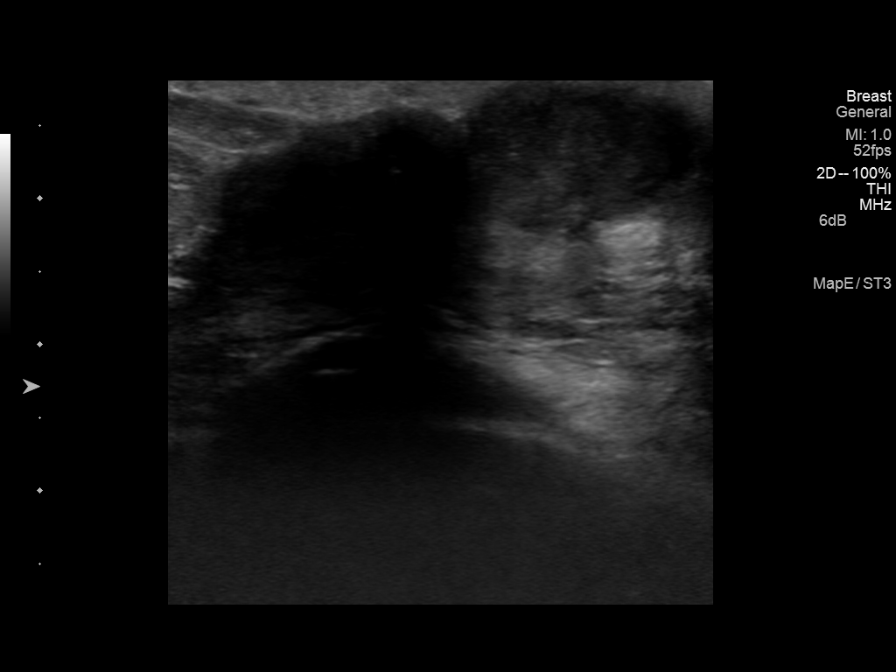
[im 9/9]
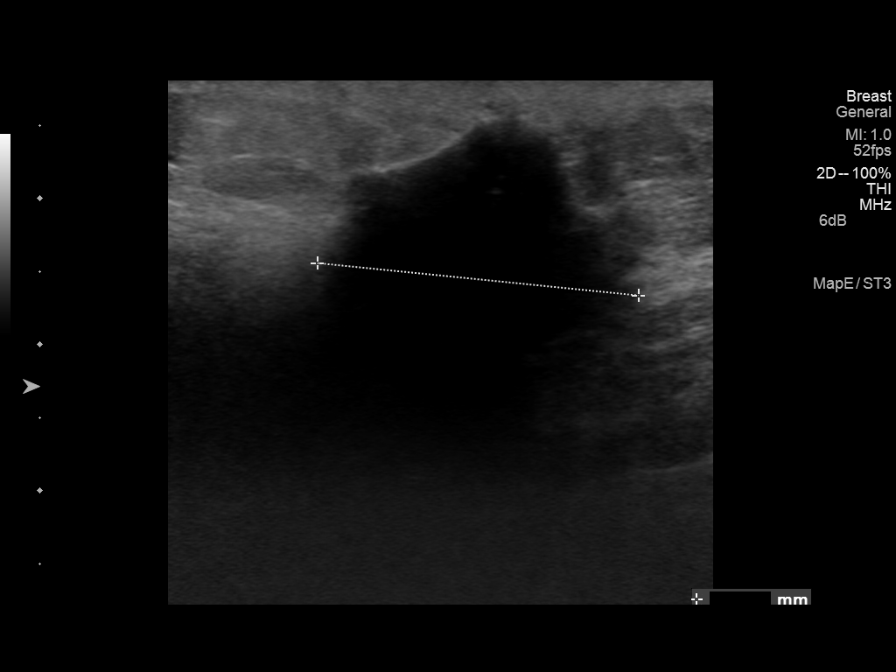

[9 of 9 positions shown; findings below may reference images not displayed]

ACR Breast Density Category c: The breast tissue is heterogeneously
dense, which may obscure small masses.

:
The known mass located within the right breast at the 5 o'clock
position in the inframammary fold region is not visible by mild
mammography due to its location. There is no mass or worrisome
calcification within the right breast. There is right breast skin
thickening present inferiorly and medially located. This appears
unchanged.

Mammographic images were processed with CAD.

On physical exam, there is a fixed palpable mass located within the
right breast at the 5 o'clock position in the inframammary fold
region with overlying skin thickening. By physical examination this
measures approximately 4 cm in size.

Ultrasound is performed, showing an irregularly marginated
hypoechoic mass located within the right breast at the 5 o'clock
position at the level of the inframammary fold. This mass extends to
the chest wall. This measures 3.8 x 2.2 x 1.7 cm in size and has
mildly decreased in size when compared with the previous ultrasound
performed at the [facility] [REDACTED] in February 2012.
IMPRESSION: Decrease in size of the known malignancy located within the right
breast at 5 o'clock position in the inframammary fold region. This
now measures 3.8 x 2.2 x 1.7 cm in size by ultrasound. There is
overlying skin thickening present which is unchanged. There are no
additional findings.

RECOMMENDATION:
Treatment plan.

I have discussed the findings and recommendations with the patient.
Results were also provided in writing at the conclusion of the
visit. If applicable, a reminder letter will be sent to the patient
regarding the next appointment.

BI-RADS CATEGORY  6: Known biopsy-proven malignancy.

## 2016-02-24 IMAGING — CR DG CHEST 2V
2 series · 2 of 2 positions shown · non-contrast
Comparison: 11/01/2012.

CLINICAL DATA: Chest and flank pain.

EXAM:
CHEST  2 VIEW

[view not recorded (1 of 2)]
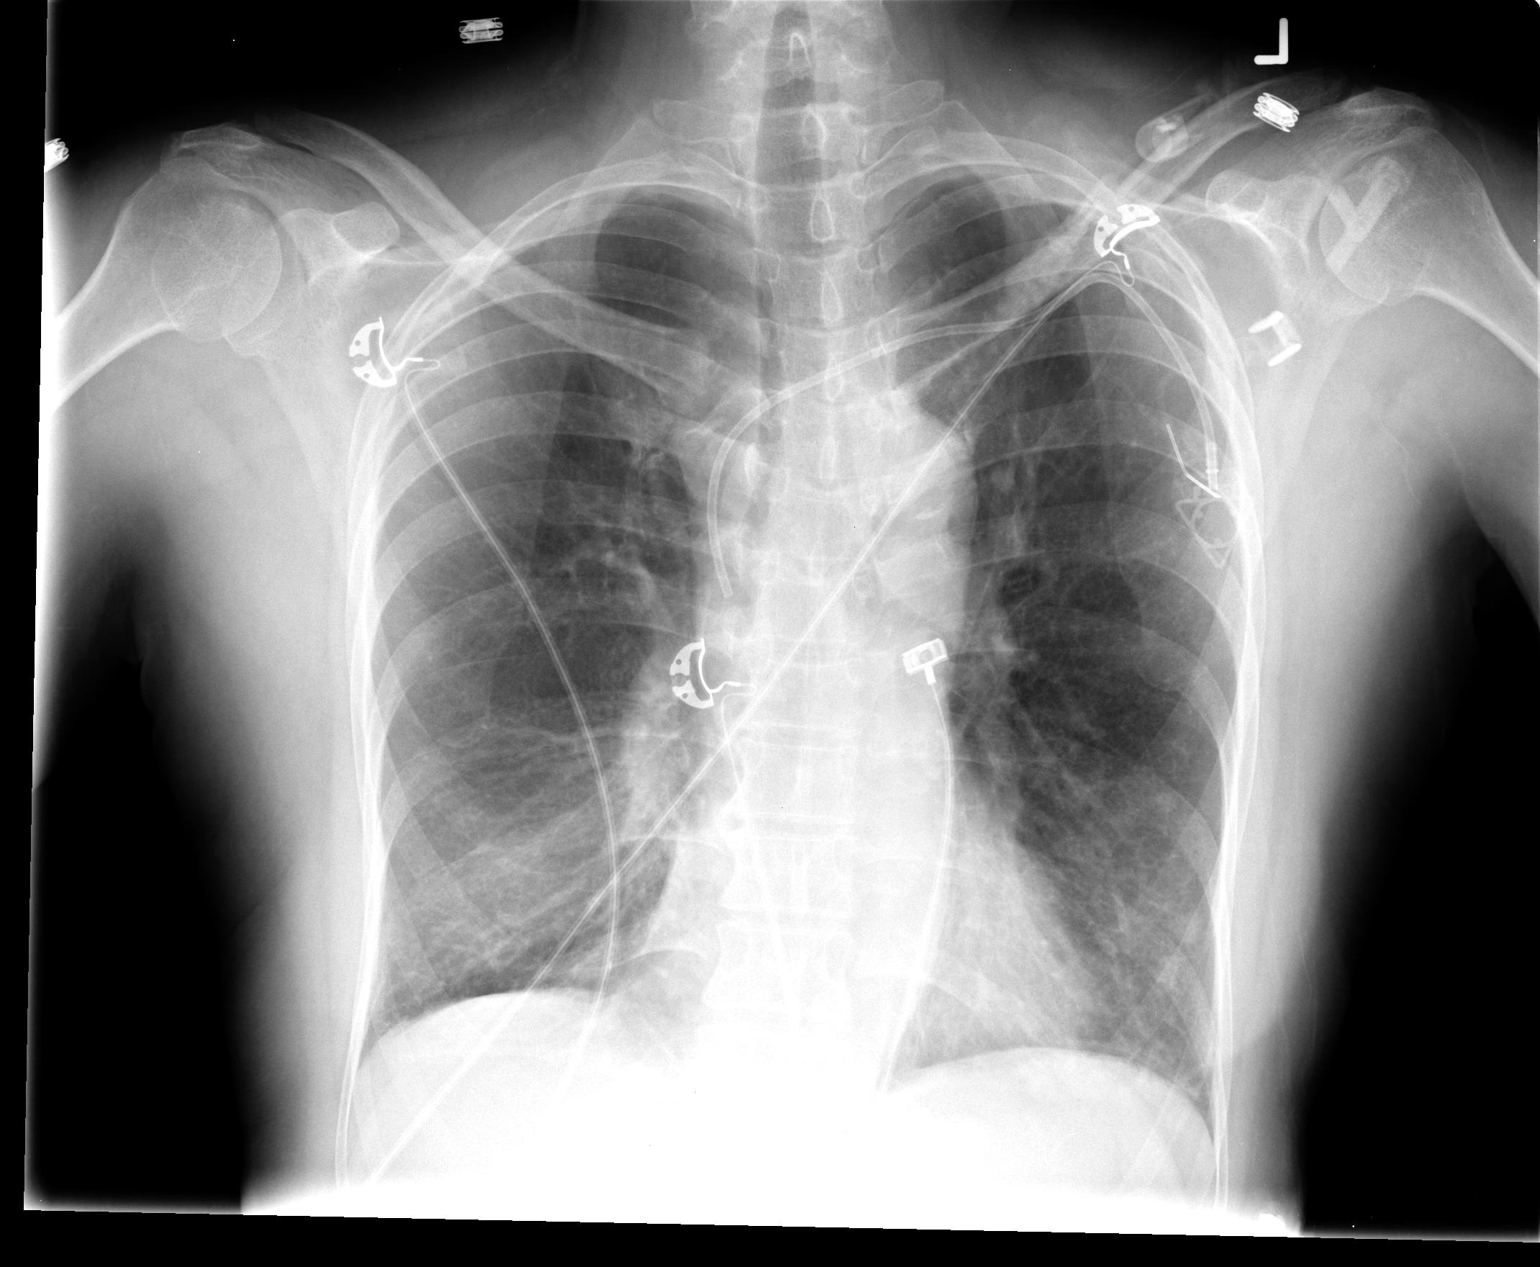

[view not recorded (2 of 2)]
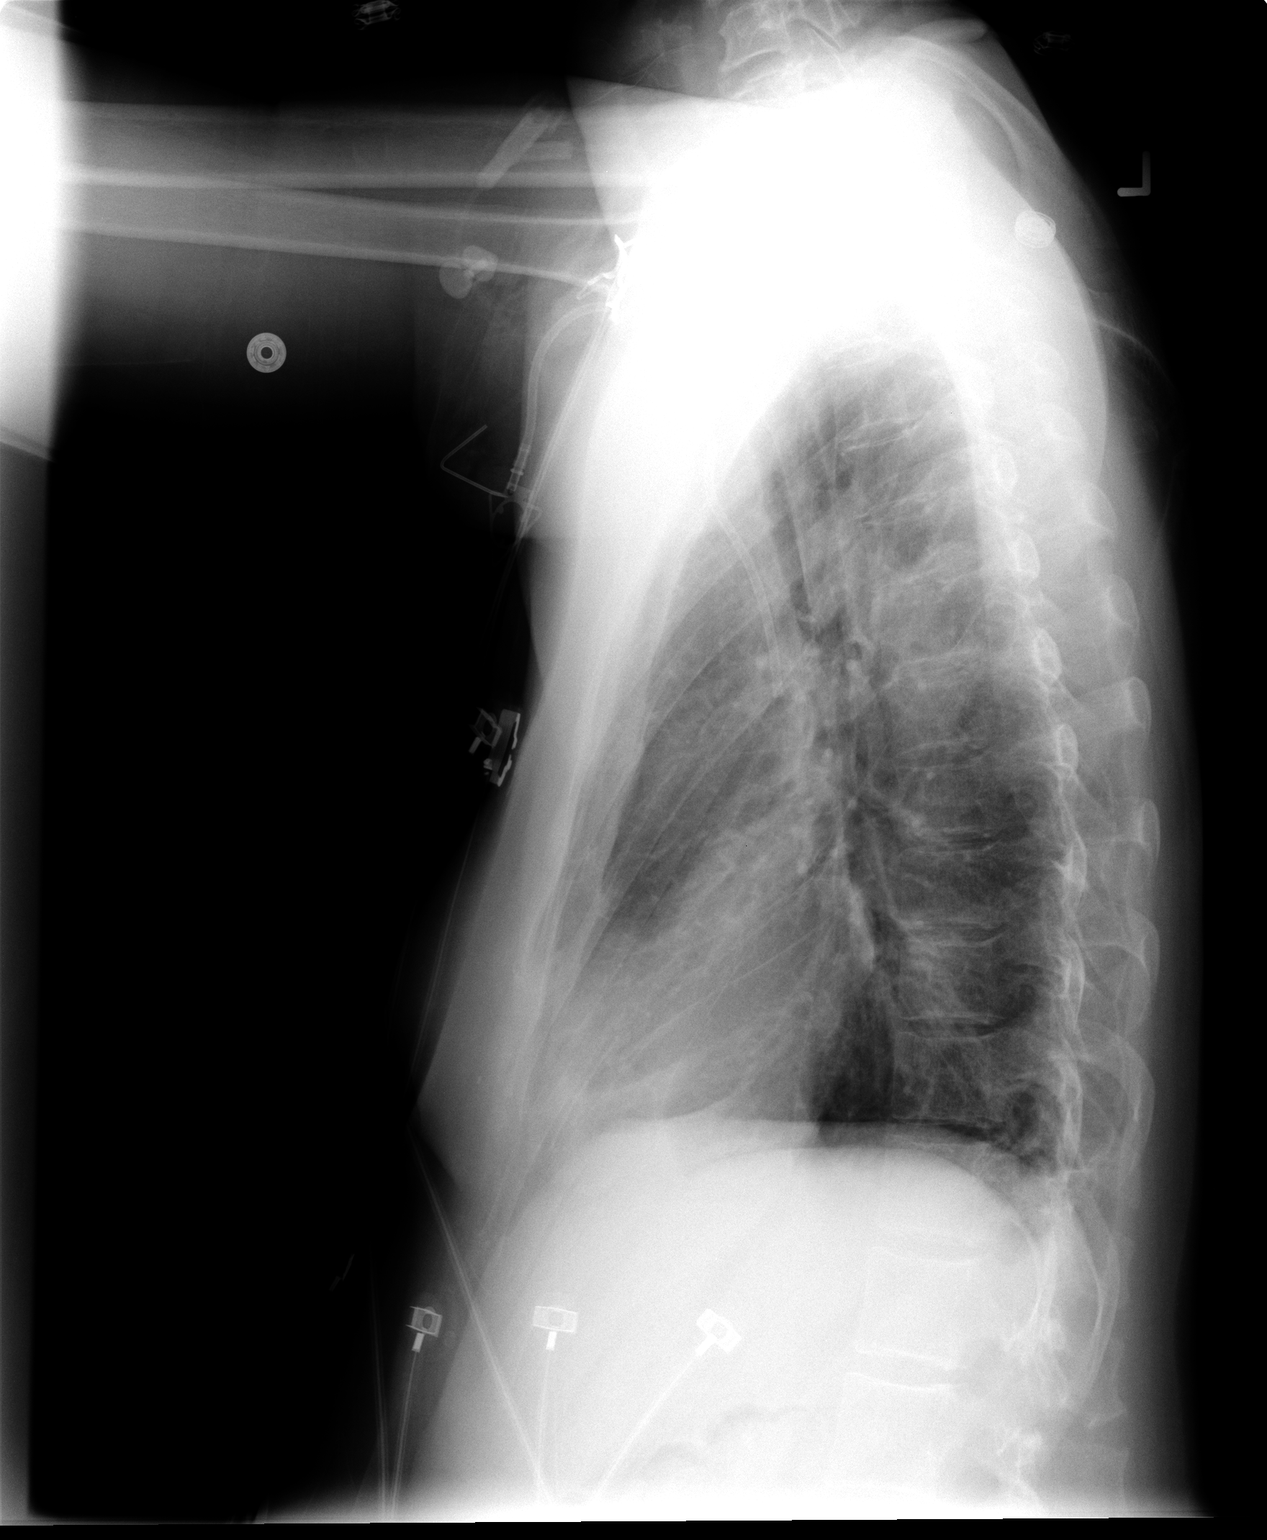

[2 of 2 positions shown; findings below may reference images not displayed]

FINDINGS: The power port is stable. The cardiac silhouette, mediastinal and
hilar contours are within normal limits and stable. There is
tortuosity, calcification an ectasia of the thoracic aorta. The
lungs are hyperinflated. Pulmonary nodules are again noted
consistent with known metastatic disease. Bibasilar atelectasis but
no infiltrates or effusions. The bony thorax is intact.
IMPRESSION: Small bilateral pulmonary nodules consistent with metastatic
disease.

Chronic lung changes but no definite acute pulmonary infiltrates.

## 2022-02-06 ENCOUNTER — Encounter (INDEPENDENT_AMBULATORY_CARE_PROVIDER_SITE_OTHER): Payer: Self-pay | Admitting: *Deleted
